# Patient Record
Sex: Male | Born: 1945 | Race: White | Hispanic: No | Marital: Married | Smoking: Never smoker
Health system: Southern US, Community
[De-identification: ages and names within clinical notes are randomized; demographics above are authoritative.]

## PROBLEM LIST (undated history)

## (undated) DIAGNOSIS — G459 Transient cerebral ischemic attack, unspecified: Secondary | ICD-10-CM

## (undated) DIAGNOSIS — F988 Other specified behavioral and emotional disorders with onset usually occurring in childhood and adolescence: Secondary | ICD-10-CM

## (undated) DIAGNOSIS — R112 Nausea with vomiting, unspecified: Secondary | ICD-10-CM

## (undated) DIAGNOSIS — E8881 Metabolic syndrome: Secondary | ICD-10-CM

## (undated) DIAGNOSIS — Z9889 Other specified postprocedural states: Secondary | ICD-10-CM

## (undated) DIAGNOSIS — I639 Cerebral infarction, unspecified: Secondary | ICD-10-CM

## (undated) DIAGNOSIS — R011 Cardiac murmur, unspecified: Secondary | ICD-10-CM

## (undated) DIAGNOSIS — B029 Zoster without complications: Secondary | ICD-10-CM

## (undated) DIAGNOSIS — G4733 Obstructive sleep apnea (adult) (pediatric): Secondary | ICD-10-CM

## (undated) DIAGNOSIS — J189 Pneumonia, unspecified organism: Secondary | ICD-10-CM

## (undated) DIAGNOSIS — K5790 Diverticulosis of intestine, part unspecified, without perforation or abscess without bleeding: Secondary | ICD-10-CM

## (undated) DIAGNOSIS — K219 Gastro-esophageal reflux disease without esophagitis: Secondary | ICD-10-CM

## (undated) DIAGNOSIS — E785 Hyperlipidemia, unspecified: Secondary | ICD-10-CM

## (undated) DIAGNOSIS — Z9989 Dependence on other enabling machines and devices: Secondary | ICD-10-CM

## (undated) DIAGNOSIS — Z87442 Personal history of urinary calculi: Secondary | ICD-10-CM

## (undated) DIAGNOSIS — R7301 Impaired fasting glucose: Secondary | ICD-10-CM

## (undated) DIAGNOSIS — M1990 Unspecified osteoarthritis, unspecified site: Secondary | ICD-10-CM

## (undated) DIAGNOSIS — I10 Essential (primary) hypertension: Secondary | ICD-10-CM

## (undated) HISTORY — DX: Diverticulosis of intestine, part unspecified, without perforation or abscess without bleeding: K57.90

## (undated) HISTORY — DX: Other specified behavioral and emotional disorders with onset usually occurring in childhood and adolescence: F98.8

## (undated) HISTORY — DX: Hyperlipidemia, unspecified: E78.5

## (undated) HISTORY — PX: TONSILLECTOMY AND ADENOIDECTOMY: SUR1326

## (undated) HISTORY — DX: Impaired fasting glucose: R73.01

## (undated) HISTORY — PX: CARPAL TUNNEL RELEASE: SHX101

## (undated) HISTORY — PX: CYSTOSCOPY W/ STONE MANIPULATION: SHX1427

## (undated) HISTORY — DX: Metabolic syndrome: E88.810

## (undated) HISTORY — DX: Metabolic syndrome: E88.81

## (undated) HISTORY — DX: Gastro-esophageal reflux disease without esophagitis: K21.9

## (undated) HISTORY — DX: Transient cerebral ischemic attack, unspecified: G45.9

## (undated) HISTORY — DX: Zoster without complications: B02.9

## (undated) HISTORY — PX: FINGER SURGERY: SHX640

---

## 2006-01-07 ENCOUNTER — Ambulatory Visit (HOSPITAL_COMMUNITY): Admission: RE | Admit: 2006-01-07 | Discharge: 2006-01-07 | Payer: Self-pay | Admitting: Internal Medicine

## 2008-04-05 ENCOUNTER — Ambulatory Visit: Payer: Self-pay | Admitting: Internal Medicine

## 2008-04-19 ENCOUNTER — Ambulatory Visit: Payer: Self-pay | Admitting: Internal Medicine

## 2008-04-19 ENCOUNTER — Encounter: Payer: Self-pay | Admitting: Internal Medicine

## 2008-06-30 ENCOUNTER — Encounter: Admission: RE | Admit: 2008-06-30 | Discharge: 2008-06-30 | Payer: Self-pay | Admitting: Internal Medicine

## 2010-04-01 ENCOUNTER — Emergency Department (HOSPITAL_COMMUNITY): Admission: EM | Admit: 2010-04-01 | Discharge: 2010-04-01 | Payer: Self-pay | Admitting: Family Medicine

## 2011-12-24 DIAGNOSIS — G459 Transient cerebral ischemic attack, unspecified: Secondary | ICD-10-CM

## 2011-12-24 HISTORY — DX: Transient cerebral ischemic attack, unspecified: G45.9

## 2012-06-23 ENCOUNTER — Other Ambulatory Visit (HOSPITAL_COMMUNITY): Payer: Self-pay | Admitting: Internal Medicine

## 2012-06-23 DIAGNOSIS — R51 Headache: Secondary | ICD-10-CM

## 2012-06-24 ENCOUNTER — Ambulatory Visit (HOSPITAL_COMMUNITY): Payer: Federal, State, Local not specified - PPO | Attending: Cardiology

## 2012-06-24 ENCOUNTER — Other Ambulatory Visit: Payer: Self-pay | Admitting: *Deleted

## 2012-06-24 DIAGNOSIS — H538 Other visual disturbances: Secondary | ICD-10-CM | POA: Insufficient documentation

## 2012-06-24 DIAGNOSIS — E785 Hyperlipidemia, unspecified: Secondary | ICD-10-CM | POA: Insufficient documentation

## 2012-06-24 DIAGNOSIS — I059 Rheumatic mitral valve disease, unspecified: Secondary | ICD-10-CM | POA: Insufficient documentation

## 2012-06-24 DIAGNOSIS — I359 Nonrheumatic aortic valve disorder, unspecified: Secondary | ICD-10-CM | POA: Insufficient documentation

## 2012-06-24 DIAGNOSIS — R51 Headache: Secondary | ICD-10-CM

## 2012-06-24 DIAGNOSIS — G43909 Migraine, unspecified, not intractable, without status migrainosus: Secondary | ICD-10-CM | POA: Insufficient documentation

## 2012-06-24 DIAGNOSIS — G459 Transient cerebral ischemic attack, unspecified: Secondary | ICD-10-CM | POA: Insufficient documentation

## 2012-06-24 NOTE — Progress Notes (Signed)
Echocardiogram performed.  

## 2012-06-26 ENCOUNTER — Encounter (INDEPENDENT_AMBULATORY_CARE_PROVIDER_SITE_OTHER): Payer: Federal, State, Local not specified - PPO

## 2012-06-26 DIAGNOSIS — H538 Other visual disturbances: Secondary | ICD-10-CM

## 2012-06-26 DIAGNOSIS — H53129 Transient visual loss, unspecified eye: Secondary | ICD-10-CM

## 2012-06-26 DIAGNOSIS — G459 Transient cerebral ischemic attack, unspecified: Secondary | ICD-10-CM

## 2012-06-29 ENCOUNTER — Encounter (HOSPITAL_COMMUNITY): Payer: Self-pay | Admitting: Internal Medicine

## 2015-05-24 ENCOUNTER — Encounter: Payer: Self-pay | Admitting: Internal Medicine

## 2016-02-27 DIAGNOSIS — M9904 Segmental and somatic dysfunction of sacral region: Secondary | ICD-10-CM | POA: Diagnosis not present

## 2016-02-27 DIAGNOSIS — M9905 Segmental and somatic dysfunction of pelvic region: Secondary | ICD-10-CM | POA: Diagnosis not present

## 2016-02-27 DIAGNOSIS — M5137 Other intervertebral disc degeneration, lumbosacral region: Secondary | ICD-10-CM | POA: Diagnosis not present

## 2016-02-27 DIAGNOSIS — Q72811 Congenital shortening of right lower limb: Secondary | ICD-10-CM | POA: Diagnosis not present

## 2016-02-27 DIAGNOSIS — M5106 Intervertebral disc disorders with myelopathy, lumbar region: Secondary | ICD-10-CM | POA: Diagnosis not present

## 2016-02-27 DIAGNOSIS — M9903 Segmental and somatic dysfunction of lumbar region: Secondary | ICD-10-CM | POA: Diagnosis not present

## 2016-03-01 DIAGNOSIS — M9903 Segmental and somatic dysfunction of lumbar region: Secondary | ICD-10-CM | POA: Diagnosis not present

## 2016-03-01 DIAGNOSIS — M5137 Other intervertebral disc degeneration, lumbosacral region: Secondary | ICD-10-CM | POA: Diagnosis not present

## 2016-03-01 DIAGNOSIS — M5106 Intervertebral disc disorders with myelopathy, lumbar region: Secondary | ICD-10-CM | POA: Diagnosis not present

## 2016-03-01 DIAGNOSIS — Q72811 Congenital shortening of right lower limb: Secondary | ICD-10-CM | POA: Diagnosis not present

## 2016-03-01 DIAGNOSIS — M9905 Segmental and somatic dysfunction of pelvic region: Secondary | ICD-10-CM | POA: Diagnosis not present

## 2016-03-01 DIAGNOSIS — M9904 Segmental and somatic dysfunction of sacral region: Secondary | ICD-10-CM | POA: Diagnosis not present

## 2016-03-04 DIAGNOSIS — M9903 Segmental and somatic dysfunction of lumbar region: Secondary | ICD-10-CM | POA: Diagnosis not present

## 2016-03-04 DIAGNOSIS — M9904 Segmental and somatic dysfunction of sacral region: Secondary | ICD-10-CM | POA: Diagnosis not present

## 2016-03-04 DIAGNOSIS — M9905 Segmental and somatic dysfunction of pelvic region: Secondary | ICD-10-CM | POA: Diagnosis not present

## 2016-03-04 DIAGNOSIS — M5137 Other intervertebral disc degeneration, lumbosacral region: Secondary | ICD-10-CM | POA: Diagnosis not present

## 2016-03-04 DIAGNOSIS — Q72811 Congenital shortening of right lower limb: Secondary | ICD-10-CM | POA: Diagnosis not present

## 2016-03-04 DIAGNOSIS — M5106 Intervertebral disc disorders with myelopathy, lumbar region: Secondary | ICD-10-CM | POA: Diagnosis not present

## 2016-03-05 DIAGNOSIS — M5137 Other intervertebral disc degeneration, lumbosacral region: Secondary | ICD-10-CM | POA: Diagnosis not present

## 2016-03-05 DIAGNOSIS — M5106 Intervertebral disc disorders with myelopathy, lumbar region: Secondary | ICD-10-CM | POA: Diagnosis not present

## 2016-03-05 DIAGNOSIS — M9904 Segmental and somatic dysfunction of sacral region: Secondary | ICD-10-CM | POA: Diagnosis not present

## 2016-03-05 DIAGNOSIS — Q72811 Congenital shortening of right lower limb: Secondary | ICD-10-CM | POA: Diagnosis not present

## 2016-03-05 DIAGNOSIS — M9905 Segmental and somatic dysfunction of pelvic region: Secondary | ICD-10-CM | POA: Diagnosis not present

## 2016-03-05 DIAGNOSIS — M9903 Segmental and somatic dysfunction of lumbar region: Secondary | ICD-10-CM | POA: Diagnosis not present

## 2016-03-07 DIAGNOSIS — M5106 Intervertebral disc disorders with myelopathy, lumbar region: Secondary | ICD-10-CM | POA: Diagnosis not present

## 2016-03-07 DIAGNOSIS — Q72811 Congenital shortening of right lower limb: Secondary | ICD-10-CM | POA: Diagnosis not present

## 2016-03-07 DIAGNOSIS — M9904 Segmental and somatic dysfunction of sacral region: Secondary | ICD-10-CM | POA: Diagnosis not present

## 2016-03-07 DIAGNOSIS — M9905 Segmental and somatic dysfunction of pelvic region: Secondary | ICD-10-CM | POA: Diagnosis not present

## 2016-03-07 DIAGNOSIS — M5137 Other intervertebral disc degeneration, lumbosacral region: Secondary | ICD-10-CM | POA: Diagnosis not present

## 2016-03-07 DIAGNOSIS — M9903 Segmental and somatic dysfunction of lumbar region: Secondary | ICD-10-CM | POA: Diagnosis not present

## 2016-03-18 DIAGNOSIS — M9905 Segmental and somatic dysfunction of pelvic region: Secondary | ICD-10-CM | POA: Diagnosis not present

## 2016-03-18 DIAGNOSIS — Q72811 Congenital shortening of right lower limb: Secondary | ICD-10-CM | POA: Diagnosis not present

## 2016-03-18 DIAGNOSIS — M5137 Other intervertebral disc degeneration, lumbosacral region: Secondary | ICD-10-CM | POA: Diagnosis not present

## 2016-03-18 DIAGNOSIS — M5106 Intervertebral disc disorders with myelopathy, lumbar region: Secondary | ICD-10-CM | POA: Diagnosis not present

## 2016-03-18 DIAGNOSIS — M9904 Segmental and somatic dysfunction of sacral region: Secondary | ICD-10-CM | POA: Diagnosis not present

## 2016-03-18 DIAGNOSIS — M9903 Segmental and somatic dysfunction of lumbar region: Secondary | ICD-10-CM | POA: Diagnosis not present

## 2016-03-19 DIAGNOSIS — M9904 Segmental and somatic dysfunction of sacral region: Secondary | ICD-10-CM | POA: Diagnosis not present

## 2016-03-19 DIAGNOSIS — M5106 Intervertebral disc disorders with myelopathy, lumbar region: Secondary | ICD-10-CM | POA: Diagnosis not present

## 2016-03-19 DIAGNOSIS — M9905 Segmental and somatic dysfunction of pelvic region: Secondary | ICD-10-CM | POA: Diagnosis not present

## 2016-03-19 DIAGNOSIS — M5137 Other intervertebral disc degeneration, lumbosacral region: Secondary | ICD-10-CM | POA: Diagnosis not present

## 2016-03-19 DIAGNOSIS — Q72811 Congenital shortening of right lower limb: Secondary | ICD-10-CM | POA: Diagnosis not present

## 2016-03-19 DIAGNOSIS — M9903 Segmental and somatic dysfunction of lumbar region: Secondary | ICD-10-CM | POA: Diagnosis not present

## 2016-03-21 DIAGNOSIS — M9905 Segmental and somatic dysfunction of pelvic region: Secondary | ICD-10-CM | POA: Diagnosis not present

## 2016-03-21 DIAGNOSIS — M5106 Intervertebral disc disorders with myelopathy, lumbar region: Secondary | ICD-10-CM | POA: Diagnosis not present

## 2016-03-21 DIAGNOSIS — M9903 Segmental and somatic dysfunction of lumbar region: Secondary | ICD-10-CM | POA: Diagnosis not present

## 2016-03-21 DIAGNOSIS — M9904 Segmental and somatic dysfunction of sacral region: Secondary | ICD-10-CM | POA: Diagnosis not present

## 2016-03-21 DIAGNOSIS — Q72811 Congenital shortening of right lower limb: Secondary | ICD-10-CM | POA: Diagnosis not present

## 2016-03-21 DIAGNOSIS — M5137 Other intervertebral disc degeneration, lumbosacral region: Secondary | ICD-10-CM | POA: Diagnosis not present

## 2016-03-26 DIAGNOSIS — Q72811 Congenital shortening of right lower limb: Secondary | ICD-10-CM | POA: Diagnosis not present

## 2016-03-26 DIAGNOSIS — M9903 Segmental and somatic dysfunction of lumbar region: Secondary | ICD-10-CM | POA: Diagnosis not present

## 2016-03-26 DIAGNOSIS — M5137 Other intervertebral disc degeneration, lumbosacral region: Secondary | ICD-10-CM | POA: Diagnosis not present

## 2016-03-26 DIAGNOSIS — M5106 Intervertebral disc disorders with myelopathy, lumbar region: Secondary | ICD-10-CM | POA: Diagnosis not present

## 2016-03-26 DIAGNOSIS — M9904 Segmental and somatic dysfunction of sacral region: Secondary | ICD-10-CM | POA: Diagnosis not present

## 2016-03-26 DIAGNOSIS — M9905 Segmental and somatic dysfunction of pelvic region: Secondary | ICD-10-CM | POA: Diagnosis not present

## 2016-03-28 DIAGNOSIS — Q72811 Congenital shortening of right lower limb: Secondary | ICD-10-CM | POA: Diagnosis not present

## 2016-03-28 DIAGNOSIS — M5137 Other intervertebral disc degeneration, lumbosacral region: Secondary | ICD-10-CM | POA: Diagnosis not present

## 2016-03-28 DIAGNOSIS — M5106 Intervertebral disc disorders with myelopathy, lumbar region: Secondary | ICD-10-CM | POA: Diagnosis not present

## 2016-03-28 DIAGNOSIS — M9903 Segmental and somatic dysfunction of lumbar region: Secondary | ICD-10-CM | POA: Diagnosis not present

## 2016-03-28 DIAGNOSIS — M9904 Segmental and somatic dysfunction of sacral region: Secondary | ICD-10-CM | POA: Diagnosis not present

## 2016-03-28 DIAGNOSIS — M9905 Segmental and somatic dysfunction of pelvic region: Secondary | ICD-10-CM | POA: Diagnosis not present

## 2016-04-02 DIAGNOSIS — Q72811 Congenital shortening of right lower limb: Secondary | ICD-10-CM | POA: Diagnosis not present

## 2016-04-02 DIAGNOSIS — M9905 Segmental and somatic dysfunction of pelvic region: Secondary | ICD-10-CM | POA: Diagnosis not present

## 2016-04-02 DIAGNOSIS — M9903 Segmental and somatic dysfunction of lumbar region: Secondary | ICD-10-CM | POA: Diagnosis not present

## 2016-04-02 DIAGNOSIS — M5106 Intervertebral disc disorders with myelopathy, lumbar region: Secondary | ICD-10-CM | POA: Diagnosis not present

## 2016-04-02 DIAGNOSIS — M5137 Other intervertebral disc degeneration, lumbosacral region: Secondary | ICD-10-CM | POA: Diagnosis not present

## 2016-04-02 DIAGNOSIS — M9904 Segmental and somatic dysfunction of sacral region: Secondary | ICD-10-CM | POA: Diagnosis not present

## 2016-04-04 DIAGNOSIS — M9904 Segmental and somatic dysfunction of sacral region: Secondary | ICD-10-CM | POA: Diagnosis not present

## 2016-04-04 DIAGNOSIS — M9903 Segmental and somatic dysfunction of lumbar region: Secondary | ICD-10-CM | POA: Diagnosis not present

## 2016-04-04 DIAGNOSIS — M5106 Intervertebral disc disorders with myelopathy, lumbar region: Secondary | ICD-10-CM | POA: Diagnosis not present

## 2016-04-04 DIAGNOSIS — M9905 Segmental and somatic dysfunction of pelvic region: Secondary | ICD-10-CM | POA: Diagnosis not present

## 2016-04-04 DIAGNOSIS — M5137 Other intervertebral disc degeneration, lumbosacral region: Secondary | ICD-10-CM | POA: Diagnosis not present

## 2016-04-04 DIAGNOSIS — Q72811 Congenital shortening of right lower limb: Secondary | ICD-10-CM | POA: Diagnosis not present

## 2016-04-11 DIAGNOSIS — Q72811 Congenital shortening of right lower limb: Secondary | ICD-10-CM | POA: Diagnosis not present

## 2016-04-11 DIAGNOSIS — I10 Essential (primary) hypertension: Secondary | ICD-10-CM | POA: Diagnosis not present

## 2016-04-11 DIAGNOSIS — M9904 Segmental and somatic dysfunction of sacral region: Secondary | ICD-10-CM | POA: Diagnosis not present

## 2016-04-11 DIAGNOSIS — R7301 Impaired fasting glucose: Secondary | ICD-10-CM | POA: Diagnosis not present

## 2016-04-11 DIAGNOSIS — Z125 Encounter for screening for malignant neoplasm of prostate: Secondary | ICD-10-CM | POA: Diagnosis not present

## 2016-04-11 DIAGNOSIS — M5137 Other intervertebral disc degeneration, lumbosacral region: Secondary | ICD-10-CM | POA: Diagnosis not present

## 2016-04-11 DIAGNOSIS — M9903 Segmental and somatic dysfunction of lumbar region: Secondary | ICD-10-CM | POA: Diagnosis not present

## 2016-04-11 DIAGNOSIS — M5106 Intervertebral disc disorders with myelopathy, lumbar region: Secondary | ICD-10-CM | POA: Diagnosis not present

## 2016-04-11 DIAGNOSIS — M9905 Segmental and somatic dysfunction of pelvic region: Secondary | ICD-10-CM | POA: Diagnosis not present

## 2016-04-11 DIAGNOSIS — E784 Other hyperlipidemia: Secondary | ICD-10-CM | POA: Diagnosis not present

## 2016-04-16 DIAGNOSIS — M9903 Segmental and somatic dysfunction of lumbar region: Secondary | ICD-10-CM | POA: Diagnosis not present

## 2016-04-16 DIAGNOSIS — M9904 Segmental and somatic dysfunction of sacral region: Secondary | ICD-10-CM | POA: Diagnosis not present

## 2016-04-16 DIAGNOSIS — M5106 Intervertebral disc disorders with myelopathy, lumbar region: Secondary | ICD-10-CM | POA: Diagnosis not present

## 2016-04-16 DIAGNOSIS — Q72811 Congenital shortening of right lower limb: Secondary | ICD-10-CM | POA: Diagnosis not present

## 2016-04-16 DIAGNOSIS — M9905 Segmental and somatic dysfunction of pelvic region: Secondary | ICD-10-CM | POA: Diagnosis not present

## 2016-04-16 DIAGNOSIS — M5137 Other intervertebral disc degeneration, lumbosacral region: Secondary | ICD-10-CM | POA: Diagnosis not present

## 2016-04-17 ENCOUNTER — Other Ambulatory Visit: Payer: Self-pay | Admitting: Internal Medicine

## 2016-04-17 DIAGNOSIS — Z125 Encounter for screening for malignant neoplasm of prostate: Secondary | ICD-10-CM | POA: Diagnosis not present

## 2016-04-17 DIAGNOSIS — Z8679 Personal history of other diseases of the circulatory system: Secondary | ICD-10-CM | POA: Diagnosis not present

## 2016-04-17 DIAGNOSIS — N5089 Other specified disorders of the male genital organs: Secondary | ICD-10-CM

## 2016-04-17 DIAGNOSIS — Z0000 Encounter for general adult medical examination without abnormal findings: Secondary | ICD-10-CM | POA: Diagnosis not present

## 2016-04-17 DIAGNOSIS — E784 Other hyperlipidemia: Secondary | ICD-10-CM | POA: Diagnosis not present

## 2016-04-17 DIAGNOSIS — I10 Essential (primary) hypertension: Secondary | ICD-10-CM | POA: Diagnosis not present

## 2016-04-17 DIAGNOSIS — M5416 Radiculopathy, lumbar region: Secondary | ICD-10-CM | POA: Diagnosis not present

## 2016-04-17 DIAGNOSIS — E8881 Metabolic syndrome: Secondary | ICD-10-CM | POA: Diagnosis not present

## 2016-04-17 DIAGNOSIS — R7301 Impaired fasting glucose: Secondary | ICD-10-CM | POA: Diagnosis not present

## 2016-04-17 DIAGNOSIS — G4733 Obstructive sleep apnea (adult) (pediatric): Secondary | ICD-10-CM | POA: Diagnosis not present

## 2016-04-18 ENCOUNTER — Ambulatory Visit
Admission: RE | Admit: 2016-04-18 | Discharge: 2016-04-18 | Disposition: A | Discharge status: Home / Self Care | Payer: Medicare HMO | Source: Ambulatory Visit | Attending: Internal Medicine | Admitting: Internal Medicine

## 2016-04-18 DIAGNOSIS — N5089 Other specified disorders of the male genital organs: Secondary | ICD-10-CM

## 2016-04-18 DIAGNOSIS — N433 Hydrocele, unspecified: Secondary | ICD-10-CM | POA: Diagnosis not present

## 2016-04-18 DIAGNOSIS — N503 Cyst of epididymis: Secondary | ICD-10-CM | POA: Diagnosis not present

## 2016-04-24 DIAGNOSIS — M9904 Segmental and somatic dysfunction of sacral region: Secondary | ICD-10-CM | POA: Diagnosis not present

## 2016-04-24 DIAGNOSIS — M5106 Intervertebral disc disorders with myelopathy, lumbar region: Secondary | ICD-10-CM | POA: Diagnosis not present

## 2016-04-24 DIAGNOSIS — M5137 Other intervertebral disc degeneration, lumbosacral region: Secondary | ICD-10-CM | POA: Diagnosis not present

## 2016-04-24 DIAGNOSIS — M9903 Segmental and somatic dysfunction of lumbar region: Secondary | ICD-10-CM | POA: Diagnosis not present

## 2016-04-24 DIAGNOSIS — Q72811 Congenital shortening of right lower limb: Secondary | ICD-10-CM | POA: Diagnosis not present

## 2016-04-24 DIAGNOSIS — M9905 Segmental and somatic dysfunction of pelvic region: Secondary | ICD-10-CM | POA: Diagnosis not present

## 2016-05-23 DIAGNOSIS — M9905 Segmental and somatic dysfunction of pelvic region: Secondary | ICD-10-CM | POA: Diagnosis not present

## 2016-05-23 DIAGNOSIS — M5106 Intervertebral disc disorders with myelopathy, lumbar region: Secondary | ICD-10-CM | POA: Diagnosis not present

## 2016-05-23 DIAGNOSIS — Q72811 Congenital shortening of right lower limb: Secondary | ICD-10-CM | POA: Diagnosis not present

## 2016-05-23 DIAGNOSIS — M5137 Other intervertebral disc degeneration, lumbosacral region: Secondary | ICD-10-CM | POA: Diagnosis not present

## 2016-05-23 DIAGNOSIS — M9904 Segmental and somatic dysfunction of sacral region: Secondary | ICD-10-CM | POA: Diagnosis not present

## 2016-05-23 DIAGNOSIS — M9903 Segmental and somatic dysfunction of lumbar region: Secondary | ICD-10-CM | POA: Diagnosis not present

## 2016-07-31 DIAGNOSIS — H2513 Age-related nuclear cataract, bilateral: Secondary | ICD-10-CM | POA: Diagnosis not present

## 2016-07-31 DIAGNOSIS — H5213 Myopia, bilateral: Secondary | ICD-10-CM | POA: Diagnosis not present

## 2016-10-05 DIAGNOSIS — Z23 Encounter for immunization: Secondary | ICD-10-CM | POA: Diagnosis not present

## 2016-11-25 DIAGNOSIS — Z6829 Body mass index (BMI) 29.0-29.9, adult: Secondary | ICD-10-CM | POA: Diagnosis not present

## 2016-11-25 DIAGNOSIS — K219 Gastro-esophageal reflux disease without esophagitis: Secondary | ICD-10-CM | POA: Diagnosis not present

## 2016-11-25 DIAGNOSIS — I10 Essential (primary) hypertension: Secondary | ICD-10-CM | POA: Diagnosis not present

## 2016-11-25 DIAGNOSIS — Z0000 Encounter for general adult medical examination without abnormal findings: Secondary | ICD-10-CM | POA: Diagnosis not present

## 2016-11-25 DIAGNOSIS — E785 Hyperlipidemia, unspecified: Secondary | ICD-10-CM | POA: Diagnosis not present

## 2017-01-17 DIAGNOSIS — H52203 Unspecified astigmatism, bilateral: Secondary | ICD-10-CM | POA: Diagnosis not present

## 2017-01-17 DIAGNOSIS — H5213 Myopia, bilateral: Secondary | ICD-10-CM | POA: Diagnosis not present

## 2017-01-17 DIAGNOSIS — H2513 Age-related nuclear cataract, bilateral: Secondary | ICD-10-CM | POA: Diagnosis not present

## 2017-04-15 DIAGNOSIS — Z125 Encounter for screening for malignant neoplasm of prostate: Secondary | ICD-10-CM | POA: Diagnosis not present

## 2017-04-15 DIAGNOSIS — I10 Essential (primary) hypertension: Secondary | ICD-10-CM | POA: Diagnosis not present

## 2017-04-15 DIAGNOSIS — R7301 Impaired fasting glucose: Secondary | ICD-10-CM | POA: Diagnosis not present

## 2017-04-15 DIAGNOSIS — E784 Other hyperlipidemia: Secondary | ICD-10-CM | POA: Diagnosis not present

## 2017-04-22 DIAGNOSIS — R7301 Impaired fasting glucose: Secondary | ICD-10-CM | POA: Diagnosis not present

## 2017-04-22 DIAGNOSIS — E784 Other hyperlipidemia: Secondary | ICD-10-CM | POA: Diagnosis not present

## 2017-04-22 DIAGNOSIS — N503 Cyst of epididymis: Secondary | ICD-10-CM | POA: Diagnosis not present

## 2017-04-22 DIAGNOSIS — Z8679 Personal history of other diseases of the circulatory system: Secondary | ICD-10-CM | POA: Diagnosis not present

## 2017-04-22 DIAGNOSIS — K219 Gastro-esophageal reflux disease without esophagitis: Secondary | ICD-10-CM | POA: Diagnosis not present

## 2017-04-22 DIAGNOSIS — E8881 Metabolic syndrome: Secondary | ICD-10-CM | POA: Diagnosis not present

## 2017-04-22 DIAGNOSIS — E781 Pure hyperglyceridemia: Secondary | ICD-10-CM | POA: Diagnosis not present

## 2017-04-22 DIAGNOSIS — I10 Essential (primary) hypertension: Secondary | ICD-10-CM | POA: Diagnosis not present

## 2017-04-22 DIAGNOSIS — M5416 Radiculopathy, lumbar region: Secondary | ICD-10-CM | POA: Diagnosis not present

## 2017-04-22 DIAGNOSIS — Z0000 Encounter for general adult medical examination without abnormal findings: Secondary | ICD-10-CM | POA: Diagnosis not present

## 2017-04-25 DIAGNOSIS — Z1212 Encounter for screening for malignant neoplasm of rectum: Secondary | ICD-10-CM | POA: Diagnosis not present

## 2017-06-20 DIAGNOSIS — H9313 Tinnitus, bilateral: Secondary | ICD-10-CM | POA: Diagnosis not present

## 2017-06-20 DIAGNOSIS — J302 Other seasonal allergic rhinitis: Secondary | ICD-10-CM | POA: Diagnosis not present

## 2017-06-20 DIAGNOSIS — R69 Illness, unspecified: Secondary | ICD-10-CM | POA: Diagnosis not present

## 2017-06-20 DIAGNOSIS — M130 Polyarthritis, unspecified: Secondary | ICD-10-CM | POA: Diagnosis not present

## 2017-06-20 DIAGNOSIS — M25549 Pain in joints of unspecified hand: Secondary | ICD-10-CM | POA: Diagnosis not present

## 2017-06-20 DIAGNOSIS — Z973 Presence of spectacles and contact lenses: Secondary | ICD-10-CM | POA: Diagnosis not present

## 2017-06-20 DIAGNOSIS — K219 Gastro-esophageal reflux disease without esophagitis: Secondary | ICD-10-CM | POA: Diagnosis not present

## 2017-06-20 DIAGNOSIS — I10 Essential (primary) hypertension: Secondary | ICD-10-CM | POA: Diagnosis not present

## 2017-06-20 DIAGNOSIS — Z0000 Encounter for general adult medical examination without abnormal findings: Secondary | ICD-10-CM | POA: Diagnosis not present

## 2017-06-20 DIAGNOSIS — E669 Obesity, unspecified: Secondary | ICD-10-CM | POA: Diagnosis not present

## 2017-06-20 DIAGNOSIS — E785 Hyperlipidemia, unspecified: Secondary | ICD-10-CM | POA: Diagnosis not present

## 2017-08-05 DIAGNOSIS — L820 Inflamed seborrheic keratosis: Secondary | ICD-10-CM | POA: Diagnosis not present

## 2017-08-05 DIAGNOSIS — X32XXXD Exposure to sunlight, subsequent encounter: Secondary | ICD-10-CM | POA: Diagnosis not present

## 2017-08-05 DIAGNOSIS — L570 Actinic keratosis: Secondary | ICD-10-CM | POA: Diagnosis not present

## 2017-08-05 DIAGNOSIS — D225 Melanocytic nevi of trunk: Secondary | ICD-10-CM | POA: Diagnosis not present

## 2017-08-05 DIAGNOSIS — D2272 Melanocytic nevi of left lower limb, including hip: Secondary | ICD-10-CM | POA: Diagnosis not present

## 2017-08-05 DIAGNOSIS — D485 Neoplasm of uncertain behavior of skin: Secondary | ICD-10-CM | POA: Diagnosis not present

## 2017-08-05 DIAGNOSIS — G4733 Obstructive sleep apnea (adult) (pediatric): Secondary | ICD-10-CM | POA: Diagnosis not present

## 2017-08-05 DIAGNOSIS — Z1283 Encounter for screening for malignant neoplasm of skin: Secondary | ICD-10-CM | POA: Diagnosis not present

## 2017-08-22 DIAGNOSIS — D485 Neoplasm of uncertain behavior of skin: Secondary | ICD-10-CM | POA: Diagnosis not present

## 2017-08-22 DIAGNOSIS — L988 Other specified disorders of the skin and subcutaneous tissue: Secondary | ICD-10-CM | POA: Diagnosis not present

## 2017-10-04 DIAGNOSIS — Z23 Encounter for immunization: Secondary | ICD-10-CM | POA: Diagnosis not present

## 2017-12-10 DIAGNOSIS — Z0100 Encounter for examination of eyes and vision without abnormal findings: Secondary | ICD-10-CM | POA: Diagnosis not present

## 2018-01-15 ENCOUNTER — Encounter: Payer: Self-pay | Admitting: Internal Medicine

## 2018-01-15 DIAGNOSIS — G250 Essential tremor: Secondary | ICD-10-CM | POA: Diagnosis not present

## 2018-01-15 DIAGNOSIS — R1310 Dysphagia, unspecified: Secondary | ICD-10-CM | POA: Diagnosis not present

## 2018-01-15 DIAGNOSIS — H538 Other visual disturbances: Secondary | ICD-10-CM | POA: Diagnosis not present

## 2018-01-15 DIAGNOSIS — Z6830 Body mass index (BMI) 30.0-30.9, adult: Secondary | ICD-10-CM | POA: Diagnosis not present

## 2018-03-09 ENCOUNTER — Encounter (INDEPENDENT_AMBULATORY_CARE_PROVIDER_SITE_OTHER): Payer: Self-pay

## 2018-03-09 ENCOUNTER — Encounter: Payer: Self-pay | Admitting: Internal Medicine

## 2018-03-09 ENCOUNTER — Ambulatory Visit: Payer: Medicare HMO | Admitting: Internal Medicine

## 2018-03-09 VITALS — BP 136/90 | HR 80 | Ht 70.0 in | Wt 213.1 lb

## 2018-03-09 DIAGNOSIS — R1310 Dysphagia, unspecified: Secondary | ICD-10-CM | POA: Diagnosis not present

## 2018-03-09 DIAGNOSIS — Z1211 Encounter for screening for malignant neoplasm of colon: Secondary | ICD-10-CM | POA: Diagnosis not present

## 2018-03-09 DIAGNOSIS — K219 Gastro-esophageal reflux disease without esophagitis: Secondary | ICD-10-CM

## 2018-03-09 MED ORDER — NA SULFATE-K SULFATE-MG SULF 17.5-3.13-1.6 GM/177ML PO SOLN: 1.0000 | Freq: Once | ORAL | 0 refills | Status: AC

## 2018-03-09 NOTE — Patient Instructions (Signed)

## 2018-03-09 NOTE — Progress Notes (Signed)
HISTORY OF PRESENT ILLNESS:  Chris [name] is a 72 y.o. male, retired Immunologist, with past medical history as listed below who is referred by his primary care provider Dr. Brigitte Pulse regarding GERD, dysphagia, and follow-up colonoscopy. The patient was last seen April 2009 for routine screening colonoscopy. He was found to have left-sided diverticulosis and diminutive rectosigmoid colon polyps which were hyperplastic. Follow-up in 10 years recommended. The patient denies any interval family history of colon cancer or lower GI complaints. Next, he reports long-standing problems with indigestion and heartburn. Several PPIs have upset his stomach. He also reports intermit solid food dysphagia item such as rice and meat. More recently placed on pantoprazole 40 mg daily. Seems to be tolerating this. Seems to help. Still with some dysphagia. Outside office note and laboratories have been reviewed. Laboratories from January 2019 revealed unremarkable comprehensive metabolic panel and CBC with hemoglobin 14.4. Patient has never smoked and does not use alcohol.  REVIEW OF SYSTEMS:  All non-GI ROS negative unless otherwise stated in the history of present illness except for arthritis, back pain, hearing problems  Past Medical History:  Diagnosis Date  . ADD (attention deficit disorder)   . Diverticulosis   . GERD (gastroesophageal reflux disease)   . Hyperlipidemia   . IFG (impaired fasting glucose)   . Metabolic syndrome   . OSA (obstructive sleep apnea)   . Shingles   . TIA (transient ischemic attack)     Past Surgical History:  Procedure Laterality Date  . HAND SURGERY Left    thumb surgery  . hearing aids Bilateral   . TONSILECTOMY, ADENOIDECTOMY, BILATERAL MYRINGOTOMY AND TUBES      Social History Chris [name]  reports that  has never smoked. he has never used smokeless tobacco. He reports that he does not drink alcohol. His drug history is not on file.  family history includes Prostate  cancer in his father.  Not on File     PHYSICAL EXAMINATION: Vital signs: BP 136/90   Pulse 80   Ht 5\' 10"  (1.778 m)   Wt 213 lb 2 oz (96.7 kg)   BMI 30.58 kg/m   Constitutional: generally well-appearing, no acute distress Psychiatric: alert and oriented x3, cooperative Eyes: extraocular movements intact, anicteric, conjunctiva pink Mouth: oral pharynx moist, no lesions Neck: supple no lymphadenopathy Cardiovascular: heart regular rate and rhythm, no murmur Lungs: clear to auscultation bilaterally Abdomen: soft, nontender, nondistended, no obvious ascites, no peritoneal signs, normal bowel sounds, no organomegaly Rectal:deferred until colonoscopy Extremities: no clubbing cyanosis or lower extremity edema bilaterally Skin: no lesions on visible extremities Neuro: No focal deficits. Cranial nerves intact  ASSESSMENT:  #1. Chronic GERD. Doing better on pantoprazole 40 mg daily #2. Intermittent solid food dysphagia. Rule out peptic stricture #3. Index colonoscopy 2009 with diverticulosis and hyperplastic colon polyps. Due for follow-up screening. Appropriate candidate without contraindication   PLAN:  #1. Reflux precautions #2. Continue pantoprazole 40 mg daily #3. Schedule upper endoscopy with probable esophageal dilation.The nature of the procedure, as well as the risks, benefits, and alternatives were carefully and thoroughly reviewed with the patient. Ample time for discussion and questions allowed. The patient understood, was satisfied, and agreed to proceed. #4. Schedule screening colonoscopy.The nature of the procedure, as well as the risks, benefits, and alternatives were carefully and thoroughly reviewed with the patient. Ample time for discussion and questions allowed. The patient understood, was satisfied, and agreed to proceed.  A copy of this consultation note has been sent to  Dr Shaw

## 2018-03-17 DIAGNOSIS — H43811 Vitreous degeneration, right eye: Secondary | ICD-10-CM | POA: Diagnosis not present

## 2018-03-17 DIAGNOSIS — H2513 Age-related nuclear cataract, bilateral: Secondary | ICD-10-CM | POA: Diagnosis not present

## 2018-03-17 DIAGNOSIS — H5213 Myopia, bilateral: Secondary | ICD-10-CM | POA: Diagnosis not present

## 2018-03-25 ENCOUNTER — Other Ambulatory Visit: Payer: Self-pay

## 2018-03-25 ENCOUNTER — Inpatient Hospital Stay (HOSPITAL_COMMUNITY)
Admission: EM | Admit: 2018-03-25 | Discharge: 2018-03-27 | DRG: 066 | Disposition: A | Discharge status: Home / Self Care | Payer: Medicare HMO | Attending: Family Medicine | Admitting: Family Medicine

## 2018-03-25 ENCOUNTER — Emergency Department (HOSPITAL_COMMUNITY): Payer: Medicare HMO

## 2018-03-25 ENCOUNTER — Encounter (HOSPITAL_COMMUNITY): Payer: Self-pay

## 2018-03-25 DIAGNOSIS — Z9989 Dependence on other enabling machines and devices: Secondary | ICD-10-CM | POA: Diagnosis not present

## 2018-03-25 DIAGNOSIS — I639 Cerebral infarction, unspecified: Principal | ICD-10-CM

## 2018-03-25 DIAGNOSIS — F988 Other specified behavioral and emotional disorders with onset usually occurring in childhood and adolescence: Secondary | ICD-10-CM | POA: Diagnosis present

## 2018-03-25 DIAGNOSIS — E7439 Other disorders of intestinal carbohydrate absorption: Secondary | ICD-10-CM | POA: Diagnosis present

## 2018-03-25 DIAGNOSIS — K5790 Diverticulosis of intestine, part unspecified, without perforation or abscess without bleeding: Secondary | ICD-10-CM | POA: Diagnosis present

## 2018-03-25 DIAGNOSIS — R112 Nausea with vomiting, unspecified: Secondary | ICD-10-CM | POA: Diagnosis not present

## 2018-03-25 DIAGNOSIS — E669 Obesity, unspecified: Secondary | ICD-10-CM | POA: Diagnosis not present

## 2018-03-25 DIAGNOSIS — Z8673 Personal history of transient ischemic attack (TIA), and cerebral infarction without residual deficits: Secondary | ICD-10-CM

## 2018-03-25 DIAGNOSIS — E860 Dehydration: Secondary | ICD-10-CM | POA: Diagnosis present

## 2018-03-25 DIAGNOSIS — K219 Gastro-esophageal reflux disease without esophagitis: Secondary | ICD-10-CM | POA: Diagnosis present

## 2018-03-25 DIAGNOSIS — H81399 Other peripheral vertigo, unspecified ear: Secondary | ICD-10-CM | POA: Diagnosis present

## 2018-03-25 DIAGNOSIS — Z8042 Family history of malignant neoplasm of prostate: Secondary | ICD-10-CM

## 2018-03-25 DIAGNOSIS — Z974 Presence of external hearing-aid: Secondary | ICD-10-CM

## 2018-03-25 DIAGNOSIS — G4733 Obstructive sleep apnea (adult) (pediatric): Secondary | ICD-10-CM | POA: Diagnosis present

## 2018-03-25 DIAGNOSIS — Z6830 Body mass index (BMI) 30.0-30.9, adult: Secondary | ICD-10-CM

## 2018-03-25 DIAGNOSIS — Z7951 Long term (current) use of inhaled steroids: Secondary | ICD-10-CM

## 2018-03-25 DIAGNOSIS — E8881 Metabolic syndrome: Secondary | ICD-10-CM | POA: Diagnosis present

## 2018-03-25 DIAGNOSIS — R739 Hyperglycemia, unspecified: Secondary | ICD-10-CM | POA: Diagnosis present

## 2018-03-25 DIAGNOSIS — R0981 Nasal congestion: Secondary | ICD-10-CM | POA: Diagnosis not present

## 2018-03-25 DIAGNOSIS — Z7982 Long term (current) use of aspirin: Secondary | ICD-10-CM

## 2018-03-25 DIAGNOSIS — E785 Hyperlipidemia, unspecified: Secondary | ICD-10-CM | POA: Diagnosis not present

## 2018-03-25 DIAGNOSIS — D72829 Elevated white blood cell count, unspecified: Secondary | ICD-10-CM | POA: Diagnosis present

## 2018-03-25 DIAGNOSIS — I10 Essential (primary) hypertension: Secondary | ICD-10-CM | POA: Diagnosis present

## 2018-03-25 DIAGNOSIS — I159 Secondary hypertension, unspecified: Secondary | ICD-10-CM | POA: Diagnosis not present

## 2018-03-25 DIAGNOSIS — R42 Dizziness and giddiness: Secondary | ICD-10-CM | POA: Diagnosis not present

## 2018-03-25 DIAGNOSIS — H9319 Tinnitus, unspecified ear: Secondary | ICD-10-CM | POA: Diagnosis present

## 2018-03-25 DIAGNOSIS — I351 Nonrheumatic aortic (valve) insufficiency: Secondary | ICD-10-CM | POA: Diagnosis not present

## 2018-03-25 HISTORY — DX: Essential (primary) hypertension: I10

## 2018-03-25 HISTORY — DX: Obstructive sleep apnea (adult) (pediatric): Z99.89

## 2018-03-25 HISTORY — DX: Personal history of urinary calculi: Z87.442

## 2018-03-25 HISTORY — DX: Cardiac murmur, unspecified: R01.1

## 2018-03-25 HISTORY — DX: Unspecified osteoarthritis, unspecified site: M19.90

## 2018-03-25 HISTORY — DX: Cerebral infarction, unspecified: I63.9

## 2018-03-25 HISTORY — DX: Other specified postprocedural states: Z98.890

## 2018-03-25 HISTORY — DX: Pneumonia, unspecified organism: J18.9

## 2018-03-25 HISTORY — DX: Obstructive sleep apnea (adult) (pediatric): G47.33

## 2018-03-25 HISTORY — DX: Other specified postprocedural states: R11.2

## 2018-03-25 LAB — COMPREHENSIVE METABOLIC PANEL
ALK PHOS: 92 U/L (ref 38–126)
ALT: 35 U/L (ref 17–63)
AST: 25 U/L (ref 15–41)
Albumin: 4.5 g/dL (ref 3.5–5.0)
Anion gap: 13 (ref 5–15)
BILIRUBIN TOTAL: 1.3 mg/dL — AB (ref 0.3–1.2)
BUN: 10 mg/dL (ref 6–20)
CALCIUM: 9.2 mg/dL (ref 8.9–10.3)
CO2: 22 mmol/L (ref 22–32)
CREATININE: 1.14 mg/dL (ref 0.61–1.24)
Chloride: 102 mmol/L (ref 101–111)
GFR calc Af Amer: >60 mL/min (ref >60)
GFR calc non Af Amer: >60 mL/min (ref >60)
Glucose, Bld: 171 mg/dL — ABNORMAL HIGH (ref 65–99)
Potassium: 3.7 mmol/L (ref 3.5–5.1)
Sodium: 137 mmol/L (ref 135–145)
TOTAL PROTEIN: 7.4 g/dL (ref 6.5–8.1)

## 2018-03-25 LAB — PROTIME-INR
INR: 1.1
Prothrombin Time: 14.1 seconds (ref 11.4–15.2)

## 2018-03-25 LAB — DIFFERENTIAL
BASOS ABS: 0 10*3/uL (ref 0.0–0.1)
Basophils Relative: 0 %
EOS ABS: 0 10*3/uL (ref 0.0–0.7)
Eosinophils Relative: 0 %
LYMPHS ABS: 1.2 10*3/uL (ref 0.7–4.0)
LYMPHS PCT: 7 %
MONOS PCT: 4 %
Monocytes Absolute: 0.6 10*3/uL (ref 0.1–1.0)
NEUTROS PCT: 89 %
Neutro Abs: 14.4 10*3/uL — ABNORMAL HIGH (ref 1.7–7.7)

## 2018-03-25 LAB — CBC
HEMATOCRIT: 44.5 % (ref 39.0–52.0)
HEMOGLOBIN: 15.3 g/dL (ref 13.0–17.0)
MCH: 30.6 pg (ref 26.0–34.0)
MCHC: 34.4 g/dL (ref 30.0–36.0)
MCV: 89 fL (ref 78.0–100.0)
Platelets: 261 10*3/uL (ref 150–400)
RBC: 5 MIL/uL (ref 4.22–5.81)
RDW: 13.8 % (ref 11.5–15.5)
WBC: 16.2 10*3/uL — ABNORMAL HIGH (ref 4.0–10.5)

## 2018-03-25 LAB — I-STAT CHEM 8, ED
BUN: 11 mg/dL (ref 6–20)
CALCIUM ION: 1.07 mmol/L — AB (ref 1.15–1.40)
CHLORIDE: 103 mmol/L (ref 101–111)
CREATININE: 1 mg/dL (ref 0.61–1.24)
Glucose, Bld: 171 mg/dL — ABNORMAL HIGH (ref 65–99)
HCT: 46 % (ref 39.0–52.0)
Hemoglobin: 15.6 g/dL (ref 13.0–17.0)
Potassium: 3.9 mmol/L (ref 3.5–5.1)
SODIUM: 139 mmol/L (ref 135–145)
TCO2: 22 mmol/L (ref 22–32)

## 2018-03-25 LAB — I-STAT TROPONIN, ED: Troponin i, poc: 0 ng/mL (ref 0.00–0.08)

## 2018-03-25 LAB — APTT: aPTT: 28 seconds (ref 24–36)

## 2018-03-25 LAB — CBG MONITORING, ED: Glucose-Capillary: 168 mg/dL — ABNORMAL HIGH (ref 65–99)

## 2018-03-25 MED ORDER — ONDANSETRON HCL 4 MG/2ML IJ SOLN
4.0000 mg | Freq: Once | INTRAMUSCULAR | Status: AC
  Administered 2018-03-26: 4 mg via INTRAVENOUS
  Filled 2018-03-25: qty 2

## 2018-03-25 MED ORDER — SODIUM CHLORIDE 0.9 % IV BOLUS
1000.0000 mL | Freq: Once | INTRAVENOUS | Status: AC
  Administered 2018-03-26: 1000 mL via INTRAVENOUS

## 2018-03-25 MED ORDER — ONDANSETRON 4 MG PO TBDP
ORAL_TABLET | ORAL | Status: AC
  Administered 2018-03-25: 4 mg
  Filled 2018-03-25: qty 1

## 2018-03-25 MED ORDER — MECLIZINE HCL 25 MG PO TABS
25.0000 mg | ORAL_TABLET | Freq: Once | ORAL | Status: AC
  Administered 2018-03-26: 25 mg via ORAL
  Filled 2018-03-25: qty 1

## 2018-03-25 NOTE — ED Provider Notes (Signed)
[facility] DEPART[facility] Provider Note   CSN: [id number] Arrival date & time: 03/25/18  2007     History   Chief Complaint Chief Complaint  Patient presents with  . Dizziness  . Emesis    HPI Chris [name] is a 72 y.o. male.  HPI  Presents with concern for dizziness Began when waking up this AM Woke up at 8AM and had severe vertigo When eyes shut feels better. Feels like a spinning. Worse with movements, standing up, turning head.  Better when laying still. No numbness, weakness on one side, no change in vision, no change in speech Has been unable to stand or walk due to dizziness No history of similar Feels some head pressure Nausea and vomiting, likely 15-20 times Physician prescribed zofran (tablets not odt), and meczline but couldn't keep them down.   Nausea now when eyes hsut is tolerable. Threw up ODT zofran before it melted in the CT scanner after triage  Has some nasal congestion, hx of spring allergies No cough or fever No ear pain Hx of tinnitus for years 64yr ago had hearing aids   Past Medical History:  Diagnosis Date  . ADD (attention deficit disorder)   . Diverticulosis   . GERD (gastroesophageal reflux disease)   . Hyperlipidemia   . Hypertension   . IFG (impaired fasting glucose)   . Metabolic syndrome   . OSA (obstructive sleep apnea)   . Shingles   . TIA (transient ischemic attack)     Patient Active Problem List   Diagnosis Date Noted  . Acute CVA (cerebrovascular accident) ([facility]) 03/26/2018  . Obesity (BMI 30.0-34.9) 03/26/2018  . Hyperlipidemia 03/26/2018  . OSA on CPAP 03/26/2018  . Hypertension 03/26/2018  . GERD (gastroesophageal reflux disease) 03/26/2018    Past Surgical History:  Procedure Laterality Date  . HAND SURGERY Left    thumb surgery  . hearing aids Bilateral   . TONSILECTOMY, ADENOIDECTOMY, BILATERAL MYRINGOTOMY AND TUBES          Home Medications    Prior to Admission medications    Medication Sig Start Date End Date Taking? Authorizing Provider  aspirin EC 81 MG tablet Take 81 mg by mouth 3 (three) times a week.    Yes [provider]  atorvastatin (LIPITOR) 40 MG tablet Take 40 mg by mouth daily.   Yes [provider]  fluticasone (FLONASE) 50 MCG/ACT nasal spray Place 1 spray into both nostrils daily as needed for allergies.    Yes [provider]  meclizine (ANTIVERT) 25 MG tablet Take 25 mg by mouth 3 (three) times daily as needed for dizziness or nausea.  03/25/18  Yes [provider]  ondansetron (ZOFRAN) 4 MG tablet Take 4 mg by mouth 3 (three) times daily as needed for nausea.  03/25/18  Yes [provider]  pantoprazole (PROTONIX) 40 MG tablet Take 40 mg by mouth daily.   Yes [provider]  telmisartan (MICARDIS) 80 MG tablet Take 80 mg by mouth daily.   Yes [provider]    Family History Family History  Problem Relation Age of Onset  . Prostate cancer Father     Social History Social History   Tobacco Use  . Smoking status: Never Smoker  . Smokeless tobacco: Never Used  Substance Use Topics  . Alcohol use: No    Frequency: Never  . Drug use: Not on file     Allergies   Patient has no known allergies.  Review of Systems Review of Systems  Constitutional: Negative for fever.  HENT: Positive for congestion, rhinorrhea and tinnitus. Negative for ear pain and sore throat.   Eyes: Negative for visual disturbance.  Respiratory: Negative for shortness of breath.   Cardiovascular: Negative for chest pain.  Gastrointestinal: Positive for nausea and vomiting. Negative for abdominal pain, constipation and diarrhea.  Genitourinary: Negative for difficulty urinating.  Musculoskeletal: Negative for back pain and neck stiffness.  Skin: Negative for rash.  Neurological: Positive for dizziness. Negative for syncope, facial asymmetry, weakness, numbness and headaches.     Physical  Exam Updated Vital Signs BP (!) 146/78   Pulse 64   Temp 98 F (36.7 C) (Oral)   Resp 12   Ht 5\' 10"  (1.778 m)   Wt 96.6 kg (213 lb)   SpO2 97%   BMI 30.56 kg/m   Physical Exam  Constitutional: He is oriented to person, place, and time. He appears well-developed and well-nourished. No distress.  HENT:  Head: Normocephalic and atraumatic.  Eyes: Conjunctivae and EOM are normal.  Neck: Normal range of motion.  Cardiovascular: Normal rate, regular rhythm, normal heart sounds and intact distal pulses. Exam reveals no gallop and no friction rub.  No murmur heard. Pulmonary/Chest: Effort normal and breath sounds normal. No respiratory distress. He has no wheezes. He has no rales.  Abdominal: Soft. He exhibits no distension. There is no tenderness. There is no guarding.  Musculoskeletal: He exhibits no edema.  Neurological: He is alert and oriented to person, place, and time. He has normal strength. No cranial nerve deficit or sensory deficit. Gait abnormal. Coordination normal. GCS eye subscore is 4. GCS verbal subscore is 5. GCS motor subscore is 6.  Query left shoulder sensation of numbness, rest of arm normal, cannot tell if it is due to blood pressure cuff No other abnormalities of sensation, strength Ambulation deferred on initial exam Normal coordination Horizontal nystagmus, beating towards the right HINTs with deviation of gaze with head movements   Skin: Skin is warm and dry. He is not diaphoretic.  Nursing note and vitals reviewed.    ED Treatments / Results  Labs (all labs ordered are listed, but only abnormal results are displayed) Labs Reviewed  CBC - Abnormal; Notable for the following components:      Result Value   WBC 16.2 (*)    All other components within normal limits  DIFFERENTIAL - Abnormal; Notable for the following components:   Neutro Abs 14.4 (*)    All other components within normal limits  COMPREHENSIVE METABOLIC PANEL - Abnormal; Notable for the  following components:   Glucose, Bld 171 (*)    Total Bilirubin 1.3 (*)    All other components within normal limits  CBG MONITORING, ED - Abnormal; Notable for the following components:   Glucose-Capillary 168 (*)    All other components within normal limits  I-STAT CHEM 8, ED - Abnormal; Notable for the following components:   Glucose, Bld 171 (*)    Calcium, Ion 1.07 (*)    All other components within normal limits  PROTIME-INR  APTT  I-STAT TROPONIN, ED    EKG EKG Interpretation  Date/Time:  Wednesday March 25 2018 20:40:03 EDT Ventricular Rate:  87 PR Interval:  146 QRS Duration: 94 QT Interval:  376 QTC Calculation: 452 R Axis:   -13 Text Interpretation:  Normal sinus rhythm Normal ECG No previous ECGs available Confirmed by Gareth Morgan [phone]) on 03/25/2018 11:05:07 PM   Radiology Ct  Head Wo Contrast  Result Date: 03/25/2018 CLINICAL DATA:  Extreme dizziness with nausea and vomiting EXAM: CT HEAD WITHOUT CONTRAST TECHNIQUE: Contiguous axial images were obtained from the base of the skull through the vertex without intravenous contrast. COMPARISON:  None. FINDINGS: Brain: No acute territorial infarction, hemorrhage or intracranial mass. Mild atrophy. Nonenlarged ventricles. Vascular: No hyperdense vessels.  No unexpected calcification Skull: No fracture Sinuses/Orbits: Postsurgical changes of the maxillary and ethmoid sinuses with mucosal thickening present. No acute orbital abnormality. 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 IMPRESSION: 1. No CT evidence for acute intracranial abnormality. 2. Mild atrophy. Electronically Signed   By: Donavan Foil M.D.   On: 03/25/2018 21:12   Mr Brain Wo Contrast  Result Date: 03/26/2018 CLINICAL DATA:  Nausea, vomiting and dizziness. EXAM: MRI HEAD WITHOUT CONTRAST TECHNIQUE: Multiplanar, multiecho pulse sequences of the brain and surrounding structures were obtained without intravenous contrast. COMPARISON:  Head CT  03/25/2018 FINDINGS: BRAIN: The midline structures are normal. There is a punctate focus of abnormal diffusion restriction within the superior left parietal white matter, measuring approximately 5 mm. No associated hemorrhage or mass effect. No mass lesion, hydrocephalus, dural abnormality or extra-axial collection. The brain parenchymal signal is normal for the patient's age. No age-advanced or lobar predominant atrophy. No chronic microhemorrhage or superficial siderosis. VASCULAR: Major intracranial arterial and venous sinus flow voids are preserved. SKULL AND UPPER CERVICAL SPINE: The visualized skull base, calvarium, upper cervical spine and extracranial soft tissues are normal. SINUSES/ORBITS: No fluid levels or advanced mucosal thickening. No mastoid or middle ear effusion. Normal orbits. IMPRESSION: Punctate focus of acute ischemia within the left parietal white matter, without hemorrhage or mass effect. Otherwise normal MRI of the brain. Electronically Signed   By: Ulyses Jarred M.D.   On: 03/26/2018 04:19    Procedures Procedures (including critical care time)  Medications Ordered in ED Medications  hydrALAZINE (APRESOLINE) injection 10 mg (has no administration in time range)  atorvastatin (LIPITOR) tablet 80 mg (has no administration in time range)  diazepam (VALIUM) tablet 2 mg (has no administration in time range)  fluticasone (FLONASE) 50 MCG/ACT nasal spray 1 spray (has no administration in time range)  meclizine (ANTIVERT) tablet 25 mg (has no administration in time range)  pantoprazole (PROTONIX) EC tablet 40 mg (has no administration in time range)   stroke: mapping our early stages of recovery book (has no administration in time range)  0.9 %  sodium chloride infusion (has no administration in time range)  acetaminophen (TYLENOL) tablet 650 mg (has no administration in time range)    Or  acetaminophen (TYLENOL) solution 650 mg (has no administration in time range)    Or    acetaminophen (TYLENOL) suppository 650 mg (has no administration in time range)  enoxaparin (LOVENOX) injection 40 mg (has no administration in time range)  aspirin suppository 300 mg (has no administration in time range)    Or  aspirin tablet 325 mg (has no administration in time range)  ondansetron (ZOFRAN-ODT) 4 MG disintegrating tablet (4 mg  Given 03/25/18 2046)  ondansetron (ZOFRAN) injection 4 mg (4 mg Intravenous Given 03/26/18 0002)  sodium chloride 0.9 % bolus 1,000 mL (0 mLs Intravenous Stopped 03/26/18 0058)  meclizine (ANTIVERT) tablet 25 mg (25 mg Oral Given 03/26/18 0004)  diazepam (VALIUM) injection 4 mg (4 mg Intravenous Given 03/26/18 0135)  meclizine (ANTIVERT) tablet 25 mg (25 mg Oral Given 03/26/18 0250)     Initial Impression / Assessment and Plan / ED Course  I have reviewed the triage vital signs and the nursing notes.  Pertinent labs & imaging results that were available during my care of the patient were reviewed by me and considered in my medical decision making (see chart for details).    72 year old male with a history of hypertension, hyperlipidemia, TIA, presents with concern for vertigo, nausea and vomiting.  History and exam at this time are most consistent with a peripheral vertigo, with severe room spinning symptoms brought on by movements, and improved with rest and closing eyes, horizontal right beating nystagmus, and positive HINTs exam.   Plan to give IV nausea medication, meclizine, and reevaluate.  On reevaluation, patient with continuing vertigo. Given valium and additional meclizine. Patient with improvement but still unable to ambulate, now reports he has diplopia.  Will order MR for evaluation.  MR shows parietal stroke, not consistent with vertiginous symptoms. Dr. Lorraine Lax consulted and noted rotational nystagmus with upward gaze, diplopia with gaze. Concern for posterior circulation CVA which we cannot see with MRI. Will admit for further CVA work up.    Final Clinical Impressions(s) / ED Diagnoses   Final diagnoses:  Cerebrovascular accident (CVA), unspecified mechanism [facility])    ED Discharge Orders    None       Gareth Morgan, MD 03/26/18 [phone]

## 2018-03-25 NOTE — ED Triage Notes (Signed)
Pt endorses vertigo sx with n/v and dizziness that began this morning at 0800 upon awaking. Pt Contacted pcp and they called him in a prescription for zofran and meclizine but pt cannot keep it down. NO neuro deficits, VSS.

## 2018-03-26 ENCOUNTER — Inpatient Hospital Stay (HOSPITAL_COMMUNITY): Payer: Medicare HMO

## 2018-03-26 ENCOUNTER — Other Ambulatory Visit: Payer: Self-pay

## 2018-03-26 ENCOUNTER — Encounter (HOSPITAL_COMMUNITY): Payer: Self-pay | Admitting: Nurse Practitioner

## 2018-03-26 ENCOUNTER — Emergency Department (HOSPITAL_COMMUNITY): Payer: Medicare HMO

## 2018-03-26 DIAGNOSIS — E785 Hyperlipidemia, unspecified: Secondary | ICD-10-CM | POA: Diagnosis not present

## 2018-03-26 DIAGNOSIS — G4733 Obstructive sleep apnea (adult) (pediatric): Secondary | ICD-10-CM

## 2018-03-26 DIAGNOSIS — Z9989 Dependence on other enabling machines and devices: Secondary | ICD-10-CM | POA: Diagnosis not present

## 2018-03-26 DIAGNOSIS — E7439 Other disorders of intestinal carbohydrate absorption: Secondary | ICD-10-CM | POA: Diagnosis present

## 2018-03-26 DIAGNOSIS — R42 Dizziness and giddiness: Secondary | ICD-10-CM | POA: Diagnosis not present

## 2018-03-26 DIAGNOSIS — I351 Nonrheumatic aortic (valve) insufficiency: Secondary | ICD-10-CM

## 2018-03-26 DIAGNOSIS — I10 Essential (primary) hypertension: Secondary | ICD-10-CM | POA: Diagnosis present

## 2018-03-26 DIAGNOSIS — E669 Obesity, unspecified: Secondary | ICD-10-CM | POA: Diagnosis present

## 2018-03-26 DIAGNOSIS — K5790 Diverticulosis of intestine, part unspecified, without perforation or abscess without bleeding: Secondary | ICD-10-CM | POA: Diagnosis not present

## 2018-03-26 DIAGNOSIS — H81399 Other peripheral vertigo, unspecified ear: Secondary | ICD-10-CM | POA: Diagnosis present

## 2018-03-26 DIAGNOSIS — I159 Secondary hypertension, unspecified: Secondary | ICD-10-CM | POA: Diagnosis not present

## 2018-03-26 DIAGNOSIS — K219 Gastro-esophageal reflux disease without esophagitis: Secondary | ICD-10-CM | POA: Diagnosis present

## 2018-03-26 DIAGNOSIS — I639 Cerebral infarction, unspecified: Principal | ICD-10-CM

## 2018-03-26 DIAGNOSIS — Z8042 Family history of malignant neoplasm of prostate: Secondary | ICD-10-CM | POA: Diagnosis not present

## 2018-03-26 DIAGNOSIS — R739 Hyperglycemia, unspecified: Secondary | ICD-10-CM | POA: Diagnosis present

## 2018-03-26 DIAGNOSIS — F988 Other specified behavioral and emotional disorders with onset usually occurring in childhood and adolescence: Secondary | ICD-10-CM | POA: Diagnosis present

## 2018-03-26 DIAGNOSIS — E8881 Metabolic syndrome: Secondary | ICD-10-CM | POA: Diagnosis not present

## 2018-03-26 DIAGNOSIS — Z6830 Body mass index (BMI) 30.0-30.9, adult: Secondary | ICD-10-CM | POA: Diagnosis not present

## 2018-03-26 DIAGNOSIS — Z7982 Long term (current) use of aspirin: Secondary | ICD-10-CM | POA: Diagnosis not present

## 2018-03-26 DIAGNOSIS — H9319 Tinnitus, unspecified ear: Secondary | ICD-10-CM | POA: Diagnosis not present

## 2018-03-26 DIAGNOSIS — Z974 Presence of external hearing-aid: Secondary | ICD-10-CM | POA: Diagnosis not present

## 2018-03-26 DIAGNOSIS — Z7951 Long term (current) use of inhaled steroids: Secondary | ICD-10-CM | POA: Diagnosis not present

## 2018-03-26 DIAGNOSIS — Z8673 Personal history of transient ischemic attack (TIA), and cerebral infarction without residual deficits: Secondary | ICD-10-CM | POA: Diagnosis not present

## 2018-03-26 DIAGNOSIS — E860 Dehydration: Secondary | ICD-10-CM | POA: Diagnosis present

## 2018-03-26 DIAGNOSIS — D72829 Elevated white blood cell count, unspecified: Secondary | ICD-10-CM | POA: Diagnosis present

## 2018-03-26 LAB — ECHOCARDIOGRAM COMPLETE
Height: 70 in
Weight: 3408 oz

## 2018-03-26 LAB — URINALYSIS, ROUTINE W REFLEX MICROSCOPIC
Bilirubin Urine: NEGATIVE
Glucose, UA: 50 mg/dL — AB
HGB URINE DIPSTICK: NEGATIVE
Ketones, ur: 5 mg/dL — AB
Leukocytes, UA: NEGATIVE
Nitrite: NEGATIVE
Protein, ur: NEGATIVE mg/dL
SPECIFIC GRAVITY, URINE: 1.017 (ref 1.005–1.030)
pH: 6 (ref 5.0–8.0)

## 2018-03-26 MED ORDER — ATORVASTATIN CALCIUM 80 MG PO TABS
80.0000 mg | ORAL_TABLET | Freq: Every day | ORAL | Status: DC
  Administered 2018-03-26: 80 mg via ORAL
  Filled 2018-03-26 (×2): qty 1

## 2018-03-26 MED ORDER — SODIUM CHLORIDE 0.9 % IV SOLN
INTRAVENOUS | Status: DC
  Administered 2018-03-26 – 2018-03-27 (×2): via INTRAVENOUS

## 2018-03-26 MED ORDER — ACETAMINOPHEN 325 MG PO TABS: 650.0000 mg | ORAL_TABLET | ORAL | Status: DC | PRN

## 2018-03-26 MED ORDER — IOPAMIDOL (ISOVUE-370) INJECTION 76%
50.0000 mL | Freq: Once | INTRAVENOUS | Status: DC | PRN
Start: 2018-03-26 — End: 2018-03-27

## 2018-03-26 MED ORDER — DIAZEPAM 2 MG PO TABS: 2.0000 mg | ORAL_TABLET | Freq: Four times a day (QID) | ORAL | Status: DC | PRN

## 2018-03-26 MED ORDER — ENOXAPARIN SODIUM 40 MG/0.4ML ~~LOC~~ SOLN
40.0000 mg | SUBCUTANEOUS | Status: DC
  Administered 2018-03-27: 40 mg via SUBCUTANEOUS
  Filled 2018-03-26 (×2): qty 0.4

## 2018-03-26 MED ORDER — ASPIRIN 325 MG PO TABS
325.0000 mg | ORAL_TABLET | Freq: Every day | ORAL | Status: DC
  Administered 2018-03-26: 325 mg via ORAL
  Filled 2018-03-26: qty 1

## 2018-03-26 MED ORDER — ACETAMINOPHEN 650 MG RE SUPP: 650.0000 mg | RECTAL | Status: DC | PRN

## 2018-03-26 MED ORDER — ACETAMINOPHEN 160 MG/5ML PO SOLN: 650.0000 mg | ORAL | Status: DC | PRN

## 2018-03-26 MED ORDER — ASPIRIN 300 MG RE SUPP: 300.0000 mg | Freq: Every day | RECTAL | Status: DC

## 2018-03-26 MED ORDER — IOPAMIDOL (ISOVUE-370) INJECTION 76%
INTRAVENOUS | Status: AC
  Administered 2018-03-26: 50 mL
  Filled 2018-03-26: qty 50

## 2018-03-26 MED ORDER — HYDRALAZINE HCL 20 MG/ML IJ SOLN: 10.0000 mg | INTRAMUSCULAR | Status: DC | PRN

## 2018-03-26 MED ORDER — MECLIZINE HCL 25 MG PO TABS
25.0000 mg | ORAL_TABLET | Freq: Once | ORAL | Status: AC
Start: 2018-03-26 — End: 2018-03-26
  Administered 2018-03-26: 25 mg via ORAL
  Filled 2018-03-26: qty 1

## 2018-03-26 MED ORDER — PANTOPRAZOLE SODIUM 40 MG PO TBEC
40.0000 mg | DELAYED_RELEASE_TABLET | Freq: Every day | ORAL | Status: DC
  Administered 2018-03-26 – 2018-03-27 (×2): 40 mg via ORAL
  Filled 2018-03-26 (×2): qty 1

## 2018-03-26 MED ORDER — MECLIZINE HCL 12.5 MG PO TABS: 25.0000 mg | ORAL_TABLET | Freq: Three times a day (TID) | ORAL | Status: DC | PRN

## 2018-03-26 MED ORDER — FLUTICASONE PROPIONATE 50 MCG/ACT NA SUSP: 1.0000 | Freq: Every day | NASAL | Status: DC | PRN

## 2018-03-26 MED ORDER — CLOPIDOGREL BISULFATE 75 MG PO TABS
75.0000 mg | ORAL_TABLET | Freq: Every day | ORAL | Status: DC
  Administered 2018-03-26 – 2018-03-27 (×2): 75 mg via ORAL
  Filled 2018-03-26 (×2): qty 1

## 2018-03-26 MED ORDER — STROKE: EARLY STAGES OF RECOVERY BOOK
Freq: Once | Status: DC
  Filled 2018-03-26: qty 1

## 2018-03-26 MED ORDER — DIAZEPAM 5 MG/ML IJ SOLN
4.0000 mg | Freq: Once | INTRAMUSCULAR | Status: AC
  Administered 2018-03-26: 4 mg via INTRAVENOUS
  Filled 2018-03-26: qty 2

## 2018-03-26 NOTE — ED Notes (Signed)
PT taken to restroom via wheelchair

## 2018-03-26 NOTE — ED Notes (Signed)
Patient transported to MRI 

## 2018-03-26 NOTE — ED Notes (Signed)
Patient transported to CT 

## 2018-03-26 NOTE — ED Notes (Signed)
Patient is stable and ready to be transport to the floor at this time.  Report was called to 3W RN.  Belongings taken with the patient to the floor.   

## 2018-03-26 NOTE — Consult Note (Signed)
Neurology Consultation  Reason for Consult: Stroke Referring Physician: Dr. Billy Fischer  CC: Dizziness  History is obtained from: Patient, chart  HPI: Chris [name] is a 72 y.o. male who has a past medical history of hypertension, hyperlipidemia, glucose intolerance, obstructive sleep apnea compliant with CPAP, history of TIA, who woke up on the morning of 03/25/2018 at around 8 AM with dizziness. He says that his normal morning ritual is taking off his CPAP, turning to his left, hanging the mask on the side of his bed, during which he usually sometimes gets dizziness which she describes as room spinning for a few seconds but Wednesday morning, this dizziness persisted.  He did not make much of it thinking that it will go away but it did not resolve on its own.  It became problematic to the extent where it became difficult to walk.  He said he felt a little better if he laid in bed and kept his eyes closed.  As soon as he open his eyes or attempted to walk, he was extremely vertiginous.  He also reported nausea and at least 10-20 episodes of vomiting.  He denies any tingling or numbness associated with it.  Denies any headache but says that his head feels a little heavy.  Denies any frank weakness associated with the symptoms.  Denies any preceding fevers or chills.  Denies any shortness of breath chest pain.  Denies any acute hearing loss or ear pain.  Patient reports that he has had no frank speech difficulties or slurred speech but he has been having intermittent difficulty with his speech being "slow" since this morning. Overall he reports all of his symptoms are getting slightly better. ER course: He came to the emergency room where he was evaluated by the ED provider.  Noncontrast CT of the head was unremarkable.  He was given IV fluids and meclizine with no relief.  He was also given a dose of Valium which helps some but his symptoms persisted.  Because of the persistence of the symptoms and no  response to treatment, an MRI of the brain was obtained that showed a punctate area of acute ischemia within the left parietal white matter without any hemorrhage or mass-effect.   LKW: 10 PM on 03/24/2018.  Woke up with symptoms at 8:00 on 03/25/2018. tpa given?: no, outside the window Premorbid modified Rankin scale (mRS): 0  ROS: ROS was performed and is negative except as noted in the HPI.   Past Medical History:  Diagnosis Date  . ADD (attention deficit disorder)   . Diverticulosis   . GERD (gastroesophageal reflux disease)   . Hyperlipidemia   . Hypertension   . IFG (impaired fasting glucose)   . Metabolic syndrome   . OSA (obstructive sleep apnea)   . Shingles   . TIA (transient ischemic attack)    Family History  Problem Relation Age of Onset  . Prostate cancer Father     Social History:   reports that he has never smoked. He has never used smokeless tobacco. He reports that he does not drink alcohol. His drug history is not on file.  Medications No current facility-administered medications for this encounter.   Current Outpatient Medications:  .  aspirin EC 81 MG tablet, Take 81 mg by mouth 3 (three) times a week. , Disp: , Rfl:  .  atorvastatin (LIPITOR) 40 MG tablet, Take 40 mg by mouth daily., Disp: , Rfl:  .  fluticasone (FLONASE) 50 MCG/ACT nasal spray, Place  1 spray into both nostrils daily as needed for allergies. , Disp: , Rfl:  .  meclizine (ANTIVERT) 25 MG tablet, Take 25 mg by mouth 3 (three) times daily as needed for dizziness or nausea. , Disp: , Rfl:  .  ondansetron (ZOFRAN) 4 MG tablet, Take 4 mg by mouth 3 (three) times daily as needed for nausea. , Disp: , Rfl:  .  pantoprazole (PROTONIX) 40 MG tablet, Take 40 mg by mouth daily., Disp: , Rfl:  .  telmisartan (MICARDIS) 80 MG tablet, Take 80 mg by mouth daily., Disp: , Rfl:   Exam: Current vital signs: BP 139/86   Pulse 85   Temp 98 F (36.7 C) (Oral)   Resp 17   Ht 5\' 10"  (1.778 m)   Wt 96.6 kg  (213 lb)   SpO2 96%   BMI 30.56 kg/m  Vital signs in last 24 hours: Temp:  [98 F (36.7 C)] 98 F (36.7 C) (04/03 2012) Pulse Rate:  [71-88] 85 (04/04 0515) Resp:  [10-22] 17 (04/04 0515) BP: (128-171)/(65-86) 139/86 (04/04 0515) SpO2:  [93 %-98 %] 96 % (04/04 0515) Weight:  [96.6 kg (213 lb)] 96.6 kg (213 lb) (04/03 2030)  GENERAL: Awake, alert in NAD HEENT: - Normocephalic and atraumatic, dry mm, no LN++, no Thyromegally LUNGS - Clear to auscultation bilaterally with no wheezes CV - S1S2 RRR, no m/r/g, equal pulses bilaterally. ABDOMEN - Soft, nontender, nondistended with normoactive BS Ext: warm, well perfused, intact peripheral pulses, no edema  NEURO:  Mental Status: AA&Ox3  Language: speech is clear.  Naming, repetition, fluency, and comprehension intact. Cranial Nerves: PERRL. EOM testing shows mildly disconjugate eye movements, bilateral end gaze nystagmus which is worse on rightward gaze and he also reports diplopia on the rightward gaze, there is also nystagmus noted on upward gaze which is rotational, visual fields full, no facial asymmetry, facial sensation intact, hearing intact, tongue/uvula/soft palate midline, normal sternocleidomastoid and trapezius muscle strength. No evidence of tongue atrophy or fibrillations Motor: 5/5 all over.  No vertical drift. Tone: is normal and bulk is normal Sensation- Intact to light touch bilaterally Coordination: FTN intact bilaterally, no ataxia in BLE. Gait-reportedly very ataxic when attempted to walk by ED provider. HINTS test- showed +head impulse, +nystagmus (Horizontal but also vertical torsional) and +skew   NIHSS 1a Level of Conscious.: 0 1b LOC Questions: 0 1c LOC Commands: 0 2 Best Gaze: 1 3 Visual: 0 4 Facial Palsy: 0 5a Motor Arm - left: 0 5b Motor Arm - Right: 0 6a Motor Leg - Left: 0 6b Motor Leg - Right: 0 7 Limb Ataxia: 0 8 Sensory: 0 9 Best Language: 0 10 Dysarthria: 0 11 Extinct. and Inatten.: 0 TOTAL:  1  Labs I have reviewed labs in epic and the results pertinent to this consultation are:  CBC    Component Value Date/Time   WBC 16.2 (H) 03/25/2018 2039   RBC 5.00 03/25/2018 2039   HGB 15.6 03/25/2018 2052   HCT 46.0 03/25/2018 2052   PLT 261 03/25/2018 2039   MCV 89.0 03/25/2018 2039   MCH 30.6 03/25/2018 2039   MCHC 34.4 03/25/2018 2039   RDW 13.8 03/25/2018 2039   LYMPHSABS 1.2 03/25/2018 2039   MONOABS 0.6 03/25/2018 2039   EOSABS 0.0 03/25/2018 2039   BASOSABS 0.0 03/25/2018 2039    CMP     Component Value Date/Time   NA 139 03/25/2018 2052   K 3.9 03/25/2018 2052   CL 103 03/25/2018 2052  CO2 22 03/25/2018 2039   GLUCOSE 171 (H) 03/25/2018 2052   BUN 11 03/25/2018 2052   CREATININE 1.00 03/25/2018 2052   CALCIUM 9.2 03/25/2018 2039   PROT 7.4 03/25/2018 2039   ALBUMIN 4.5 03/25/2018 2039   AST 25 03/25/2018 2039   ALT 35 03/25/2018 2039   ALKPHOS 92 03/25/2018 2039   BILITOT 1.3 (H) 03/25/2018 2039   GFRNONAA >60 03/25/2018 2039   GFRAA >60 03/25/2018 2039   Imaging I have reviewed the images obtained: CT-scan of the brain-no acute changes  MRI examination of the brain-punctate area of restricted diffusion in the left parietal lobe.    There might be an additional focus of restricted diffusion in the brainstem v artifact (not mentioned in official read)- might need to d/w Neurorads again in the AM.   Assessment:  72 year old man with history of hypertension hyperlipidemia presented to the emergency room for evaluation of vertigo that started the morning of 03/25/2018 when he woke up. His last known normal was the night prior.  Outside the window for TPA. Examination reveals a horizontal nystagmus with diplopia while looking to the right, and also torsional nystagmus on upward gaze.  HINTS exam is not all that useful as he has all 3 components positive but I strongly suspect that he has a small brainstem stroke on top of the left parietal lobe stroke that  has been demonstrated on the MRI, with a brainstem stroke probably not being completely visualized because of the slice thickness and slice gaps on MRI sequences. The left parietal stroke can not explain his symptoms. I would look for a cardiac source given a very small left parietal stroke and a possible brainstem stroke.  The stroke in the left parietal area, as well as the presumed brainstem stroke could both be lacunars as well although the location in the parietal areas not typical for lacunar infarcts.  Differentials at this time: -Acute ischemic stroke- cardio embolic versus small vessel -Peripheral vertigo   Recommendations: -Admit to hospitalist -Telemetry, might need long-term cardiac monitoring- i.e. 30-day monitoring or loop recorder. -Frequent neuro checks -Use eye patch for symptomatic relief -CTA head/neck -Allow for permissive hypertension-treat only if systolic blood pressures over 220 on a as needed basis.  Blood pressure should be normalized to 140/90 or below on discharge. -2D echo -Hemoglobin A1c -Lipid panel -Aspirin 325 -Atorvastatin 80 -PT, OT, ST consults -Vestibular rehab -Symptomatic treatment of the vertigo with IV fluids, meclizine and Valium as needed. -Might need to repeat MRI with thin cuts through the brainstem for confirmation of brainstem stroke.  Please page stroke NP/PA/MD (listed on AMION)  from 8am-4 pm as this patient will be followed by the stroke team at this point.  -- Amie Portland, MD [facility] Pager: [phone] If 7pm to 7am, please call on call as listed on AMION.

## 2018-03-26 NOTE — ED Notes (Signed)
Service Response called and a clear liquid tray was ordered, per Velna Hatchet, RN.

## 2018-03-26 NOTE — ED Notes (Signed)
PT. Return from MRI via stretcher.

## 2018-03-26 NOTE — Progress Notes (Signed)
  Echocardiogram 2D Echocardiogram has been performed.  Johny Chess 03/26/2018, 5:03 PM

## 2018-03-26 NOTE — Progress Notes (Signed)
STROKE TEAM PROGRESS NOTE  Chris [name] is a 72 y.o. male who has a past medical history of hypertension, hyperlipidemia, glucose intolerance, obstructive sleep apnea compliant with CPAP, history of TIA, who woke up on the morning of 03/25/2018 at around 8 AM with dizziness. He says that his normal morning ritual is taking off his CPAP, turning to his left, hanging the mask on the side of his bed, during which he usually sometimes gets dizziness which she describes as room spinning for a few seconds but Wednesday morning, this dizziness persisted.  He did not make much of it thinking that it will go away but it did not resolve on its own.  It became problematic to the extent where it became difficult to walk.  He said he felt a little better if he laid in bed and kept his eyes closed.  As soon as he open his eyes or attempted to walk, he was extremely vertiginous.  He also reported nausea and at least 10-20 episodes of vomiting.  He denies any tingling or numbness associated with it.  Denies any headache but says that his head feels a little heavy.  Denies any frank weakness associated with the symptoms.  Denies any preceding fevers or chills.  Denies any shortness of breath chest pain.  Denies any acute hearing loss or ear pain.  Patient reports that he has had no frank speech difficulties or slurred speech but he has been having intermittent difficulty with his speech being "slow" since this morning. Overall he reports all of his symptoms are getting slightly better. ER course: He came to the emergency room where he was evaluated by the ED provider.  Noncontrast CT of the head was unremarkable.  He was given IV fluids and meclizine with no relief.  He was also given a dose of Valium which helps some but his symptoms persisted.  Because of the persistence of the symptoms and no response to treatment, an MRI of the brain was obtained that showed a punctate area of acute ischemia within the left parietal white  matter without any hemorrhage or mass-effect.   LKW: 10 PM on 03/24/2018.  Woke up with symptoms at 8:00 on 03/25/2018. tpa given?: no, outside the window Premorbid modified Rankin scale (mRS): 0   INTERVAL HISTORY  patient states he developed sudden onset of vertigo upon awakening with some dizziness, imbalance and blurred vision which appears to be improving but is not back to baseline.he still has horizontal diplopia on extreme lateral gaze more on the right and left side.He denies any focal symptoms involving his right hand. He has no prior history of strokes.  CBC:  CBC Latest Ref Rng & Units 03/25/2018 03/25/2018  WBC 4.0 - 10.5 K/uL - 16.2(H)  Hemoglobin 13.0 - 17.0 g/dL 15.6 15.3  Hematocrit 39.0 - 52.0 % 46.0 44.5  Platelets 150 - 400 K/uL - 261    Comprehensive Metabolic Panel:   CMP Latest Ref Rng & Units 03/25/2018 03/25/2018  Glucose 65 - 99 mg/dL 171(H) 171(H)  BUN 6 - 20 mg/dL 11 10  Creatinine 0.61 - 1.24 mg/dL 1.00 1.14  Sodium 135 - 145 mmol/L 139 137  Potassium 3.5 - 5.1 mmol/L 3.9 3.7  Chloride 101 - 111 mmol/L 103 102  CO2 22 - 32 mmol/L - 22  Calcium 8.9 - 10.3 mg/dL - 9.2  Total Protein 6.5 - 8.1 g/dL - 7.4  Total Bilirubin 0.3 - 1.2 mg/dL - 1.3(H)  Alkaline Phos 38 -  126 U/L - 92  AST 15 - 41 U/L - 25  ALT 17 - 63 U/L - 35   Lipid Panel: No results found for: CHOL, TRIG, HDL, CHOLHDL, VLDL, LDLCALC HgbA1c: No results found for: HGBA1C Urine Drug Screen: No results found for: LABOPIA, COCAINSCRNUR, LABBENZ, AMPHETMU, THCU, LABBARB  Alcohol Level No results found for: ETH  Vitals:   03/26/18 1400 03/26/18 1415 03/26/18 1445 03/26/18 1515  BP: (!) 149/78 140/84 (!) 144/74 (!) 144/74  Pulse: 68 83 79 94  Resp: 12 14 15 17   Temp:      TempSrc:      SpO2: 99% 100% 96% 99%  Weight:      Height:       Pleasant elderly Caucasian male not in distress. Sitting up comfortably in bed. . Afebrile. Head is nontraumatic. Neck is supple without bruit.    Cardiac exam no  murmur or gallop. Lungs are clear to auscultation. Distal pulses are well felt. Neurological Exam ;  Awake  Alert oriented x 3. Normal speech and language.eye movements full without nystagmus.fundi were not visualized. Vision acuity and fields appear normal. He has subjective horizontal diplopia which is binocular on extreme lateral gaze right more than left. Head shaking produces subjective dizziness without objective nystagmus noted.Hearing is normal. Palatal movements are normal. Face symmetric. Tongue midline. Normal strength, tone, reflexes and coordination. Normal sensation. Gait deferred.  ASSESSMENT/PLAN Chris [name] is a 72 y.o. male with history of HTN, HLD, glucose intolerance, OSA on CPAP, hx TIA presenting with dizziness upon awakening and had difficulty walking. .   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 Atrophy.   MRI  Punctate L parietal WM infarct  CTA head, CTA neck no significant stenosis, occlusion or dissection  2D Echo  pending   LDL pending   HgbA1c pending   Lovenox 40 mg sq daily for VTE prophylaxis Diet clear liquid Room service appropriate? Yes; Fluid consistency: Thin Fall precautions  aspirin 81 mg daily prior to admission, now on aspirin 325 mg daily. Given stroke on aspirin, recommend change to plavix. Orders written  Therapy recommendations:  pending   Disposition:  pending   Hypertension  Stable . Permissive hypertension (OK if < 220/120) but gradually normalize in 5-7 days . Long-term BP goal normotensive  Hyperlipidemia  Home meds:  lipitor 40  LDL pending, goal < 70  Now on lipitor 80  Continue statin at discharge  Glucose Intolerance  HgbA1c pending, goal < 7.0  Other Stroke Risk Factors  Advanced age  Obesity, Body mass index is 30.56 kg/m.  Hx TIA  Obstructive sleep apnea,  on CPAP at home  Other Active Problems  Leukocytosis - Likely secondary to dehydration and recurrent nausea and vomiting in the setting of vertigo symptoms  GERD  Hospital day # 0 I have personally examined this patient, reviewed notes, independently viewed imaging studies, participated in medical decision making and plan of care.ROS completed by me personally and pertinent positives fully documented  I have made any additions or clarifications directly to the above note. Agree with note above. He presented with sudden onset of vertigo and diplopia or dizziness likely due to a small brainstem infarct not visualized on MRI. Etiology likely small vessel disease. MRI shows incidental tiny punctate left parietal subcortical lacunar infarct. Recommend Plavix for stroke prevention as  patient states that aspirin causes stomach upset for him. Check ongoing stroke workup. Greater than 50% time during this 35 minute visit was spent on counseling and coordination of care about his lacunar stroke advancing questions.  Antony Contras, MD Medical Director [facility] Pager: [phone] 03/26/2018 5:06 PM        To contact Stroke Continuity provider, please refer to [email or web address] After hours, contact General Neurology

## 2018-03-26 NOTE — H&P (Addendum)
History and Physical    Chris [name] MWU:[id number] DOB: [birth year] DOA: 03/25/2018  **Will admit patient based on the expectation that the patient will need hospitalization/ hospital care that crosses at least 2 midnights  PCP: Marton Redwood, MD   Attending physician: Evangeline Gula  Patient coming from/Resides with: Private residence/wife  Chief Complaint: Dizziness and vertigo symptoms  HPI: Chris [name] is a 72 y.o. male with medical history significant for TIA, hypertension, dyslipidemia, glucose intolerance, sleep apnea on CPAP.  Patient awakened on the morning of 4/3 with dizziness and vertigo symptoms.  Symptoms persisted.  PCP was notified.  Zofran was ordered for nausea and meclizine was ordered for the vertigo symptoms. Symptoms did not improve and worsened with ambulation.  At least 10-20 episodes of nausea with vomiting.  No other neurological symptoms.  Patient presented to the ER for further evaluation.  He was treated symptomatically for vertigo with IV fluids and meclizine as well as Valium but symptoms persisted.  CT the head was unremarkable.  MRI of the brain demonstrated punctate area of acute ischemia left parietal lobe without hemorrhage or mass-effect.  Neurology was consulted and recommended admission for CVA evaluation.  ED Course:  Vital Signs: BP (!) 146/78   Pulse 64   Temp 98 F (36.7 C) (Oral)   Resp 12   Ht 5\' 10"  (1.778 m)   Wt 96.6 kg (213 lb)   SpO2 97%   BMI 30.56 kg/m  CT Without contrast and MRI brain: As above Lab data: Sodium 137, potassium 3.7, glucose 171, BUN 10, creatinine 1.14, anion gap 13, total bilirubin 1.3 otherwise LFTs normal, white count 16,200, neutrophils 89%, absolute neutrophils 14.4%, hemoglobin 15.3, platelets 261,000, coags are normal Medications and treatments: 4 mg p.o. x1, Zofran 4 mg IV x1, normal saline 1 L x1, meclizine 25 mg p.o. x2, Valium 4 mg IV x1  Review of Systems:  In addition to the HPI above,  No Fever-chills,  myalgias or other constitutional symptoms No Headache, changes with Vision or hearing, new weakness, tingling, numbness in any extremity,dysarthria or word finding difficulty, tremors or seizure activity No problems swallowing food or Liquids, indigestion/reflux, choking or coughing while eating, abdominal pain with or after eating No Chest pain, Cough or Shortness of Breath, palpitations, orthopnea or DOE No Abdominal pain, melena,hematochezia, dark tarry stools, constipation No dysuria, malodorous urine, hematuria or flank pain No new skin rashes, lesions, masses or bruises, No new joint pains, aches, swelling or redness No recent unintentional weight gain or loss No polyuria, polydypsia or polyphagia   Past Medical History:  Diagnosis Date  . ADD (attention deficit disorder)   . Diverticulosis   . GERD (gastroesophageal reflux disease)   . Hyperlipidemia   . Hypertension   . IFG (impaired fasting glucose)   . Metabolic syndrome   . OSA (obstructive sleep apnea)   . Shingles   . TIA (transient ischemic attack)     Past Surgical History:  Procedure Laterality Date  . HAND SURGERY Left    thumb surgery  . hearing aids Bilateral   . TONSILECTOMY, ADENOIDECTOMY, BILATERAL MYRINGOTOMY AND TUBES      Social History   Socioeconomic History  . Marital status: Married    Spouse name: Not on file  . Number of children: Not on file  . Years of education: Not on file  . Highest education level: Not on file  Occupational History  . Not on file  Social Needs  . Emergency planning/management officer  strain: Not on file  . Food insecurity:    Worry: Not on file    Inability: Not on file  . Transportation needs:    Medical: Not on file    Non-medical: Not on file  Tobacco Use  . Smoking status: Never Smoker  . Smokeless tobacco: Never Used  Substance and Sexual Activity  . Alcohol use: No    Frequency: Never  . Drug use: Not on file  . Sexual activity: Not on file  Lifestyle  . Physical  activity:    Days per week: Not on file    Minutes per session: Not on file  . Stress: Not on file  Relationships  . Social connections:    Talks on phone: Not on file    Gets together: Not on file    Attends religious service: Not on file    Active member of club or organization: Not on file    Attends meetings of clubs or organizations: Not on file    Relationship status: Not on file  . Intimate partner violence:    Fear of current or ex partner: Not on file    Emotionally abused: Not on file    Physically abused: Not on file    Forced sexual activity: Not on file  Other Topics Concern  . Not on file  Social History Narrative  . Not on file    Mobility: Independent Work history: Not obtained   No Known Allergies  Family History  Problem Relation Age of Onset  . Prostate cancer Father      Prior to Admission medications   Medication Sig Start Date End Date Taking? Authorizing Provider  aspirin EC 81 MG tablet Take 81 mg by mouth 3 (three) times a week.    Yes [provider]  atorvastatin (LIPITOR) 40 MG tablet Take 40 mg by mouth daily.   Yes [provider]  fluticasone (FLONASE) 50 MCG/ACT nasal spray Place 1 spray into both nostrils daily as needed for allergies.    Yes [provider]  meclizine (ANTIVERT) 25 MG tablet Take 25 mg by mouth 3 (three) times daily as needed for dizziness or nausea.  03/25/18  Yes [provider]  ondansetron (ZOFRAN) 4 MG tablet Take 4 mg by mouth 3 (three) times daily as needed for nausea.  03/25/18  Yes [provider]  pantoprazole (PROTONIX) 40 MG tablet Take 40 mg by mouth daily.   Yes [provider]  telmisartan (MICARDIS) 80 MG tablet Take 80 mg by mouth daily.   Yes [provider]    Physical Exam: Vitals:   03/26/18 0615 03/26/18 0630 03/26/18 0645 03/26/18 0700  BP: (!) 145/91 132/77 135/75 (!) 146/78  Pulse: 81 72 74 64  Resp: 18 12 16 12   Temp:        TempSrc:      SpO2: 96% 93% 97% 97%  Weight:      Height:          Constitutional: NAD, calm, comfortable Eyes: PERRL, lids and conjunctivae normal ENMT: Mucous membranes are dry. Posterior pharynx clear of any exudate or lesions.Normal dentition.  Neck: normal, supple, no masses, no thyromegaly Respiratory: clear to auscultation bilaterally, no wheezing, no crackles. Normal respiratory effort. No accessory muscle use.  Cardiovascular: Regular rate and rhythm, no murmurs / rubs / gallops. No extremity edema. 2+ pedal pulses. No carotid bruits.  Abdomen: no tenderness, no masses palpated. No hepatosplenomegaly. Bowel sounds positive.  Musculoskeletal: no  clubbing / cyanosis. No joint deformity upper and lower extremities. Good ROM, no contractures. Normal muscle tone.  Skin: no rashes, lesions, ulcers. No induration Neurologic: CN 2-12 grossly intact except for mild horizontal nystagmus slightly more noticeable with right gaze testing. Sensation intact, DTR normal. Strength 5/5 x all 4 extremities.  Psychiatric: Normal judgment and insight. Alert and oriented x 3. Normal mood.    Labs on Admission: I have personally reviewed following labs and imaging studies  CBC: Recent Labs  Lab 03/25/18 2039 03/25/18 2052  WBC 16.2*  --   NEUTROABS 14.4*  --   HGB 15.3 15.6  HCT 44.5 46.0  MCV 89.0  --   PLT 261  --    Basic Metabolic Panel: Recent Labs  Lab 03/25/18 2039 03/25/18 2052  NA 137 139  K 3.7 3.9  CL 102 103  CO2 22  --   GLUCOSE 171* 171*  BUN 10 11  CREATININE 1.14 1.00  CALCIUM 9.2  --    GFR: Estimated Creatinine Clearance: 76.7 mL/min (by C-G formula based on SCr of 1 mg/dL). Liver Function Tests: Recent Labs  Lab 03/25/18 2039  AST 25  ALT 35  ALKPHOS 92  BILITOT 1.3*  PROT 7.4  ALBUMIN 4.5   No results for input(s): LIPASE, AMYLASE in the last 168 hours. No results for input(s): AMMONIA in the last 168 hours. Coagulation Profile: Recent Labs   Lab 03/25/18 2039  INR 1.10   Cardiac Enzymes: No results for input(s): CKTOTAL, CKMB, CKMBINDEX, TROPONINI in the last 168 hours. BNP (last 3 results) No results for input(s): PROBNP in the last 8760 hours. HbA1C: No results for input(s): HGBA1C in the last 72 hours. CBG: Recent Labs  Lab 03/25/18 2309  GLUCAP 168*   Lipid Profile: No results for input(s): CHOL, HDL, LDLCALC, TRIG, CHOLHDL, LDLDIRECT in the last 72 hours. Thyroid Function Tests: No results for input(s): TSH, T4TOTAL, FREET4, T3FREE, THYROIDAB in the last 72 hours. Anemia Panel: No results for input(s): VITAMINB12, FOLATE, FERRITIN, TIBC, IRON, RETICCTPCT in the last 72 hours. Urine analysis: No results found for: COLORURINE, APPEARANCEUR, LABSPEC, PHURINE, GLUCOSEU, HGBUR, BILIRUBINUR, KETONESUR, PROTEINUR, UROBILINOGEN, NITRITE, LEUKOCYTESUR Sepsis Labs: @LABRCNTIP (procalcitonin:4,lacticidven:4) )No results found for this or any previous visit (from the past 240 hour(s)).   Radiological Exams on Admission: Ct Head Wo Contrast  Result Date: 03/25/2018 CLINICAL DATA:  Extreme dizziness with nausea and vomiting EXAM: CT HEAD WITHOUT CONTRAST TECHNIQUE: Contiguous axial images were obtained from the base of the skull through the vertex without intravenous contrast. COMPARISON:  None. FINDINGS: Brain: No acute territorial infarction, hemorrhage or intracranial mass. Mild atrophy. Nonenlarged ventricles. Vascular: No hyperdense vessels.  No unexpected calcification Skull: No fracture Sinuses/Orbits: Postsurgical changes of the maxillary and ethmoid sinuses with mucosal thickening present. No acute orbital abnormality. 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 IMPRESSION: 1. No CT evidence for acute intracranial abnormality. 2. Mild atrophy. Electronically Signed   By: Donavan Foil M.D.   On: 03/25/2018 21:12   Mr Brain Wo Contrast  Result Date: 03/26/2018 CLINICAL DATA:  Nausea, vomiting and dizziness.  EXAM: MRI HEAD WITHOUT CONTRAST TECHNIQUE: Multiplanar, multiecho pulse sequences of the brain and surrounding structures were obtained without intravenous contrast. COMPARISON:  Head CT 03/25/2018 FINDINGS: BRAIN: The midline structures are normal. There is a punctate focus of abnormal diffusion restriction within the superior left parietal white matter, measuring approximately 5 mm. No associated hemorrhage or mass effect. No mass lesion, hydrocephalus, dural abnormality or extra-axial collection.  The brain parenchymal signal is normal for the patient's age. No age-advanced or lobar predominant atrophy. No chronic microhemorrhage or superficial siderosis. VASCULAR: Major intracranial arterial and venous sinus flow voids are preserved. SKULL AND UPPER CERVICAL SPINE: The visualized skull base, calvarium, upper cervical spine and extracranial soft tissues are normal. SINUSES/ORBITS: No fluid levels or advanced mucosal thickening. No mastoid or middle ear effusion. Normal orbits. IMPRESSION: Punctate focus of acute ischemia within the left parietal white matter, without hemorrhage or mass effect. Otherwise normal MRI of the brain. Electronically Signed   By: Ulyses Jarred M.D.   On: 03/26/2018 04:19    EKG: (Independently reviewed) sinus rhythm with ventricular rate 87 bpm, QTC 452 ms, normal R wave rotation, no ischemic changes  Assessment/Plan Principal Problem:   Acute CVA (cerebrovascular accident)  -Patient presents with dizziness and vertigo symptoms worse with movement and upright position with MRI confirming left parietal ischemic CVA without hemorrhage or mass-effect -CTA head and neck -Echocardiogram -Permissive hypertension: treat SBP >/= 220 w/IV hydralazine prn -HgbA1c/FLP -Atorvastatin 80 mg -PT/OT/SLP evaluation -PT vestibular evaluation -Valium prn vertigo symptoms with meclizine prn for symptoms not relieved with Valium -Frequent neurological checks -Mobilize with  assistance  Active Problems:   Hyperlipidemia -Home atorvastatin increased from 40 mg to 80 mg in context of acute CVA    Hypertension -Allow for permissive hypertension -Home telmisartan being held initially -Neurology recommends BP normalized to 140/90 or below upon discharge    Acute hyperglycemia -Likely secondary to acute stressor from stroke and recurrent vertigo symptoms with nausea vomiting, dehydration -Follow-up on HgbA1c in the event this is precursor to diabetes diagnosis -Noted with glycosuria and small amount of ketones in urine    Leukocytosis -Likely secondary to dehydration and recurrent nausea and vomiting in the setting of vertigo symptoms -For completeness of evaluation obtain urinalysis with culture -No other infectious signs noted -Normal saline IV fluid at 75/hr and can discontinue once tolerates oral intake without nausea and vomiting    Obesity (BMI 30.0-34.9)/OSA on CPAP -Continue nocturnal CPAP    GERD (gastroesophageal reflux disease) -Continue PPI    **Additional lab, imaging and/or diagnostic evaluation at discretion of supervising physician  DVT prophylaxis: Lovenox Code Status: DNR Family Communication: Wife Disposition Plan: Home Consults called: Neurology/[place]    Samella Parr ANP-BC [facility] Pager [phone]   If 7PM-7AM, please contact night-coverage [email or web address] Password [facility]  03/26/2018, 8:19 AM

## 2018-03-27 DIAGNOSIS — K219 Gastro-esophageal reflux disease without esophagitis: Secondary | ICD-10-CM

## 2018-03-27 DIAGNOSIS — Z9989 Dependence on other enabling machines and devices: Secondary | ICD-10-CM

## 2018-03-27 DIAGNOSIS — E785 Hyperlipidemia, unspecified: Secondary | ICD-10-CM

## 2018-03-27 DIAGNOSIS — E669 Obesity, unspecified: Secondary | ICD-10-CM

## 2018-03-27 DIAGNOSIS — G4733 Obstructive sleep apnea (adult) (pediatric): Secondary | ICD-10-CM

## 2018-03-27 DIAGNOSIS — I159 Secondary hypertension, unspecified: Secondary | ICD-10-CM

## 2018-03-27 LAB — LIPID PANEL
CHOL/HDL RATIO: 2.9 ratio
Cholesterol: 96 mg/dL (ref 0–200)
HDL: 33 mg/dL — AB (ref >40)
LDL Cholesterol: 45 mg/dL (ref 0–99)
TRIGLYCERIDES: 92 mg/dL (ref <150)
VLDL: 18 mg/dL (ref 0–40)

## 2018-03-27 LAB — HEMOGLOBIN A1C
HEMOGLOBIN A1C: 6.1 % — AB (ref 4.8–5.6)
MEAN PLASMA GLUCOSE: 128.37 mg/dL

## 2018-03-27 LAB — URINE CULTURE

## 2018-03-27 MED ORDER — CLOPIDOGREL BISULFATE 75 MG PO TABS: 75.0000 mg | ORAL_TABLET | Freq: Every day | ORAL | 0 refills | Status: AC

## 2018-03-27 NOTE — Progress Notes (Signed)
Patient and wife given and reviewed discharge AVS. Awaits transport to discharge home in wife's car. Patient has received bed side commode and front wheel walker.

## 2018-03-27 NOTE — Care Management Note (Signed)
Case Management Note  Patient Details  Name: Chris [name] MRN: [id number] Date of Birth: [birth year]  Subjective/Objective:                    Action/Plan: Pt discharging home with self care. CM consulted for outpatient therapy. CM met with the patient and his spouse and provided choice. They selected [facility]. Orders in Epic and information on the AVS.  Pt with orders for walker and 3 in 1. Jermaine with [facility] DME notified and will deliver to the room. Wife to provide transportation home and supervision at home.   Expected Discharge Date:  03/27/18               Expected Discharge Plan:  OP Rehab  In-House Referral:     Discharge planning Services  CM Consult  Post Acute Care Choice:    Choice offered to:  Patient, Spouse  DME Arranged:  3-N-1, Walker rolling DME Agency:  [place]:    [place]:     Status of Service:  Completed, signed off  If discussed at [place] of Stay Meetings, dates discussed:    Additional Comments:  Pollie Friar, RN 03/27/2018, 4:19 PM

## 2018-03-27 NOTE — Evaluation (Signed)
Physical Therapy Evaluation Patient Details Name: Chris [name] MRN: [id number] DOB: 1946/06/14 Today's Date: 03/27/2018   History of Present Illness  Chris [name] is a 72 y.o. male who has a past medical history of hypertension, hyperlipidemia, glucose intolerance, obstructive sleep apnea compliant with CPAP, history of TIA, who woke up on the morning of 03/25/2018 at around 8 AM with dizziness.  MRI revealed left parietal infarct.   Clinical Impression  Pt admitted with above diagnosis. Pt currently with functional limitations due to the deficits listed below (see PT Problem List). Pt was able to ambulate with RW in hallway using compensation techniques with min guard to min assist.  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  Pt will benefit from skilled PT to increase their independence and safety with mobility to allow discharge to the venue listed below.      Follow Up Recommendations Outpatient PT;Supervision/Assistance - 24 hour(for vestibular rehab)    Equipment Recommendations  Rolling walker with 5" wheels    Recommendations for Other Services       Precautions / Restrictions Precautions Precautions: Fall Restrictions Weight Bearing Restrictions: No      Mobility  Bed Mobility Overal bed mobility: Independent             General bed mobility comments: incr time due to dizziness.  Pt cautious.   Transfers Overall transfer level: Needs assistance Equipment used: Rolling walker (2 wheeled) Transfers: Sit to/from Stand Sit to Stand: Min guard         General transfer comment: Assisted pt with min guard assist for stability as dizziness causing decr balance.  Teaching pt compensation strategies but pt needs cuing and practice.    Ambulation/Gait Ambulation/Gait assistance: Min assist Ambulation Distance (Feet): 110 Feet Assistive device: Rolling walker (2 wheeled) Gait Pattern/deviations: Decreased stride length;Step-through  pattern;Drifts right/left;Staggering right;Staggering left   Gait velocity interpretation: Below normal speed for age/gender General Gait Details: Pt looking at targets to keep dizziness less with ambulation.  Did well with using compensation overall but needs cues.  Pt self correcting when he did lose balance but still min assist for stability overall as he was just unsure of steps and needing cues for sequencing steps and RW.    Stairs            Wheelchair Mobility    Modified Rankin (Stroke Patients Only)       Balance Overall balance assessment: Needs assistance Sitting-balance support: No upper extremity supported;Feet supported Sitting balance-Leahy Scale: Good     Standing balance support: Bilateral upper extremity supported;During functional activity Standing balance-Leahy Scale: Poor Standing balance comment: relies on RW for balance.  Dizziness causing instability at times                             Pertinent Vitals/Pain Pain Assessment: No/denies pain    Home Living Family/patient expects to be discharged to:: Private residence Living Arrangements: Spouse/significant other Available Help at Discharge: Family;Available 24 hours/day Type of Home: House Home Access: Stairs to enter Entrance Stairs-Rails: None Entrance Stairs-Number of Steps: 3 Home Layout: Two level;Able to live on main level with bedroom/bathroom Home Equipment: (CPAP)      Prior Function Level of Independence: Independent               Hand Dominance        Extremity/Trunk Assessment   Upper Extremity Assessment Upper Extremity Assessment: Defer  to OT evaluation    Lower Extremity Assessment Lower Extremity Assessment: Overall WFL for tasks assessed    Cervical / Trunk Assessment Cervical / Trunk Assessment: Normal  Communication   Communication: No difficulties  Cognition Arousal/Alertness: Awake/alert Behavior During Therapy: WFL for tasks  assessed/performed Overall Cognitive Status: Within Functional Limits for tasks assessed                                        General Comments General comments (skin integrity, edema, etc.): Pt positive head thrust left suggesting left hypofunction.      Exercises Other Exercises Other Exercises: initiated x1 exercises - pt understands to perform 2x 5 x day.    Assessment/Plan    PT Assessment Patient needs continued PT services  PT Problem List Decreased activity tolerance;Decreased balance;Decreased mobility;Decreased knowledge of use of DME;Decreased safety awareness;Decreased knowledge of precautions       PT Treatment Interventions DME instruction;Gait training;Functional mobility training;Therapeutic activities;Therapeutic exercise;Balance training;Patient/family education;Stair training    PT Goals (Current goals can be found in the Care Plan section)  Acute Rehab PT Goals Patient Stated Goal: to get better PT Goal Formulation: With patient Time For Goal Achievement: 04/10/18 Potential to Achieve Goals: Good    Frequency Min 3X/week   Barriers to discharge        Co-evaluation               AM-PAC PT "6 Clicks" Daily Activity  Outcome Measure Difficulty turning over in bed (including adjusting bedclothes, sheets and blankets)?: None Difficulty moving from lying on back to sitting on the side of the bed? : None Difficulty sitting down on and standing up from a chair with arms (e.g., wheelchair, bedside commode, etc,.)?: A Little Help needed moving to and from a bed to chair (including a wheelchair)?: A Little Help needed walking in hospital room?: A Little Help needed climbing 3-5 steps with a railing? : A Lot 6 Click Score: 19    End of Session Equipment Utilized During Treatment: Gait belt Activity Tolerance: Patient limited by fatigue(limited by dizziness) Patient left: in chair;with call bell/phone within reach Nurse Communication:  Mobility status PT Visit Diagnosis: Muscle weakness (generalized) (M62.81);Unsteadiness on feet (R26.81)    Time: 4008-6761 PT Time Calculation (min) (ACUTE ONLY): 35 min   Charges:   PT Evaluation $PT Eval Moderate Complexity: 1 Mod PT Treatments $Gait Training: 8-22 mins   PT G Codes:        Cortina Vultaggio,PT Acute Rehabilitation [phone] (pager)   Denice Paradise 03/27/2018, 11:08 AM

## 2018-03-27 NOTE — Progress Notes (Signed)
Occupational Therapy Evaluation Patient Details Name: Chris [name] MRN: [id number] DOB: [birth year] Today's Date: 03/27/2018    History of Present Illness Chris [name] is a 72 y.o. male who has a past medical history of hypertension, hyperlipidemia, glucose intolerance, obstructive sleep apnea compliant with CPAP, history of TIA, who woke up on the morning of 03/25/2018 at around 8 AM with dizziness.  MRI revealed left parietal infarct. Per neurology, small brainstem infarct not visualized on MRI   Clinical Impression   PTA, pt lived with wife, is retired and was independent with all ADL and mobility, including driving. Pt complains of horizontal diplopia in R/L lateral gaze which is significantly reduced with use of partial occlusion to improve functional vision. Recommend pt have S with all mobility @ RW level and follow up with OT at the neuro outpt center. Will follow acutely to facilitate safe DC home and maximize functional level of independence.     Follow Up Recommendations  Outpatient OT;Supervision - Intermittent(initially with all mobility)    Equipment Recommendations  3 in 1 bedside commode(to use as shower seat)    Recommendations for Other Services       Precautions / Restrictions Precautions Precautions: Fall Restrictions Weight Bearing Restrictions: No      Mobility Bed Mobility Overal bed mobility: Independent             General bed mobility comments: incr time due to dizziness.  Pt cautious.   Transfers Overall transfer level: Needs assistance Equipment used: Rolling walker (2 wheeled) Transfers: Sit to/from Stand Sit to Stand: Min guard         General transfer comment: Assisted pt with min guard assist for stability as dizziness causing decr balance.  Teaching pt compensation strategies but pt needs cuing and practice.      Balance Overall balance assessment: Needs assistance Sitting-balance support: No upper extremity supported;Feet  supported Sitting balance-Leahy Scale: Good     Standing balance support: Bilateral upper extremity supported;During functional activity Standing balance-Leahy Scale: Poor Standing balance comment: relies on RW for balance.  Cues to use target as focal point during mobility                           ADL either performed or assessed with clinical judgement   ADL Overall ADL's : Needs assistance/impaired     Grooming: Standing;Min guard   Upper Body Bathing: Set up;Supervision/ safety   Lower Body Bathing: Minimal assistance;Sit to/from stand   Upper Body Dressing : Set up;Sitting   Lower Body Dressing: Minimal assistance;Sit to/from stand   Toilet Transfer: Minimal assistance;RW;Ambulation;Comfort height toilet   Toileting- Clothing Manipulation and Hygiene: Supervision/safety;Sit to/from stand       Functional mobility during ADLs: Minimal assistance;Rolling walker;Cueing for safety General ADL Comments: vc for hand placement with use of RW; began education on safety during ADL adn functional mobility with using sense of touch to increase safety; discussed using rails when going up/down stairs adn having wife with him when he gets in/out of shower; recommend for pt to sit when bathing due to unsteadiness     Vision Baseline Vision/History: Wears glasses Wears Glasses: At all times Patient Visual Report: Diplopia Vision Assessment?: Yes Eye Alignment: Within Functional Limits Ocular Range of Motion: Within Functional Limits Alignment/Gaze Preference: Within Defined Limits Tracking/Visual Pursuits: Decreased smoothness of horizontal tracking;Decreased smoothness of vertical tracking Saccades: Additional eye shifts occurred during testing Convergence: Within functional limits Diplopia Assessment: Objects  split side to side;Disappears with one eye closed;Other (comment)(lat R/L gaze) Depth Perception: Undershoots;Overshoots Additional Comments: Appears to be R  eye dominant; L nasal portion partially occluded. Pt reports this significantly improves his functional vision     Perception Perception Comments: spatial deficits due to vision deficits   Praxis Praxis Praxis tested?: Within functional limits    Pertinent Vitals/Pain Pain Assessment: Faces Faces Pain Scale: Hurts a little bit Pain Location: Headache Pain Descriptors / Indicators: Headache Pain Intervention(s): Limited activity within patient's tolerance     Hand Dominance Right   Extremity/Trunk Assessment Upper Extremity Assessment Upper Extremity Assessment: Overall WFL for tasks assessed   Lower Extremity Assessment Lower Extremity Assessment: Overall WFL for tasks assessed   Cervical / Trunk Assessment Cervical / Trunk Assessment: Normal   Communication Communication Communication: No difficulties   Cognition Arousal/Alertness: Awake/alert Behavior During Therapy: WFL for tasks assessed/performed Overall Cognitive Status: Within Functional Limits for tasks assessed                                     General Comments      Exercises    Shoulder Instructions      Home Living Family/patient expects to be discharged to:: Private residence Living Arrangements: Spouse/significant other Available Help at Discharge: Family;Available 24 hours/day Type of Home: House Home Access: Stairs to enter [employer] of Steps: 3 Entrance Stairs-Rails: None Home Layout: Two level;Able to live on main level with bedroom/bathroom Alternate Level Stairs-Number of Steps: flight   Bathroom Shower/Tub: Occupational psychologist: Standard Bathroom Accessibility: Yes How Accessible: Accessible via walker Home Equipment: None(CPAP)          Prior Functioning/Environment Level of Independence: Independent        Comments: drives; retired; plays [employer] instuments         OT Problem List: Impaired balance (sitting and/or standing);Impaired  vision/perception;Decreased knowledge of use of DME or AE      OT Treatment/Interventions: Self-care/ADL training;Neuromuscular education;DME and/or AE instruction;Therapeutic activities;Visual/perceptual remediation/compensation;Patient/family education;Balance training    OT Goals(Current goals can be found in the care plan section) Acute Rehab OT Goals Patient Stated Goal: to get better OT Goal Formulation: With patient Time For Goal Achievement: 04/10/18 Potential to Achieve Goals: Good  OT Frequency: Min 3X/week   Barriers to D/C:            Co-evaluation              AM-PAC PT "6 Clicks" Daily Activity     Outcome Measure Help from another person eating meals?: None Help from another person taking care of personal grooming?: A Little Help from another person toileting, which includes using toliet, bedpan, or urinal?: A Little Help from another person bathing (including washing, rinsing, drying)?: A Little Help from another person to put on and taking off regular upper body clothing?: None Help from another person to put on and taking off regular lower body clothing?: A Little 6 Click Score: 20   End of Session Equipment Utilized During Treatment: Rolling walker Nurse Communication: Mobility status;Other (comment)(partial occlusion)  Activity Tolerance: Patient tolerated treatment well Patient left: Other (comment)(in bathroom - nsg made aware)  OT Visit Diagnosis: Unsteadiness on feet (R26.81);Muscle weakness (generalized) (M62.81);Low vision, both eyes (H54.2)                Time: 7673-4193 OT Time Calculation (min): 28 min Charges:  OT General Charges $OT Visit: 1 Visit OT Evaluation $OT Eval Moderate Complexity: 1 Mod OT Treatments $Self Care/Home Management : 8-22 mins G-Codes:     [facility], OT/L  [phone] 03/27/2018  Stellar Gensel,HILLARY 03/27/2018, 1:07 PM

## 2018-03-27 NOTE — Progress Notes (Signed)
STROKE TEAM PROGRESS NOTE      INTERVAL HISTORY  Patient states he  He states his dizziness, imbalance and blurred vision which appears to be   back to baseline.he still has horizontal diplopia on extreme lateral gaze more on the right and left side. Echocardiogram was unremarkable.lipid profile and hemoglobin A1c were also normal  CBC:  CBC Latest Ref Rng & Units 03/25/2018 03/25/2018  WBC 4.0 - 10.5 K/uL - 16.2(H)  Hemoglobin 13.0 - 17.0 g/dL 15.6 15.3  Hematocrit 39.0 - 52.0 % 46.0 44.5  Platelets 150 - 400 K/uL - 261    Comprehensive Metabolic Panel:   CMP Latest Ref Rng & Units 03/25/2018 03/25/2018  Glucose 65 - 99 mg/dL 171(H) 171(H)  BUN 6 - 20 mg/dL 11 10  Creatinine 0.61 - 1.24 mg/dL 1.00 1.14  Sodium 135 - 145 mmol/L 139 137  Potassium 3.5 - 5.1 mmol/L 3.9 3.7  Chloride 101 - 111 mmol/L 103 102  CO2 22 - 32 mmol/L - 22  Calcium 8.9 - 10.3 mg/dL - 9.2  Total Protein 6.5 - 8.1 g/dL - 7.4  Total Bilirubin 0.3 - 1.2 mg/dL - 1.3(H)  Alkaline Phos 38 - 126 U/L - 92  AST 15 - 41 U/L - 25  ALT 17 - 63 U/L - 35   Lipid Panel:     Component Value Date/Time   CHOL 96 03/27/2018 0527   TRIG 92 03/27/2018 0527   HDL 33 (L) 03/27/2018 0527   CHOLHDL 2.9 03/27/2018 0527   VLDL 18 03/27/2018 0527   LDLCALC 45 03/27/2018 0527   HgbA1c:  Lab Results  Component Value Date   HGBA1C 6.1 (H) 03/27/2018   Urine Drug Screen: No results found for: LABOPIA, COCAINSCRNUR, LABBENZ, AMPHETMU, THCU, LABBARB  Alcohol Level No results found for: [facility]  Vitals:   03/27/18 0435 03/27/18 0659 03/27/18 0800 03/27/18 1200  BP: 138/86 (!) 148/82 139/80 132/79  Pulse: 63  64 68  Resp:   13 16  Temp: 98.3 F (36.8 C)  98.4 F (36.9 C) 98.2 F (36.8 C)  TempSrc: Oral  Oral Oral  SpO2: 97%  95% 96%  Weight:      Height:       Pleasant elderly Caucasian male not in distress. Sitting up comfortably in bed. . Afebrile. Head is nontraumatic. Neck is supple without bruit.    Cardiac exam no murmur or  gallop. Lungs are clear to auscultation. Distal pulses are well felt. Neurological Exam ;  Awake  Alert oriented x 3. Normal speech and language.eye movements full without nystagmus.fundi were not visualized. Vision acuity and fields appear normal. He has subjective horizontal diplopia which is binocular on extreme lateral gaze right more than left. Head shaking produces subjective dizziness without objective nystagmus noted.Hearing is normal. Palatal movements are normal. Face symmetric. Tongue midline. Normal strength, tone, reflexes and coordination. Normal sensation. Gait deferred.  ASSESSMENT/PLAN [name] is a 72 y.o. male with history of HTN, HLD, glucose intolerance, OSA on CPAP, hx TIA presenting with dizziness upon awakening and had difficulty walking. .   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 Atrophy.   MRI  Punctate L parietal WM infarct  CTA head, CTA neck no significant stenosis, occlusion or dissection  2D Echo  unremarkable  LDL 45 mg %  HgbA1c 6.1  Lovenox 40 mg sq daily for VTE prophylaxis Fall precautions Diet regular Room service appropriate? Yes; Fluid consistency: Thin  aspirin 81 mg daily prior to admission, now on aspirin 325 mg daily. Given stroke on aspirin, recommend change to plavix. Orders written  Therapy recommendations: outpatient PT  Disposition:  home  Hypertension  Stable . Permissive hypertension (OK if < 220/120) but gradually normalize in 5-7 days . Long-term BP goal normotensive  Hyperlipidemia  Home meds:  lipitor 40  LDL  45 mg% goal < 70  Now on lipitor 80  Continue statin at discharge  Glucose Intolerance  HgbA1c pending, goal < 7.0  Other Stroke Risk Factors  Advanced age  Obesity, Body mass index is 30.43 kg/m.  Hx TIA  Obstructive sleep apnea, on CPAP at  home  Other Active Problems  Leukocytosis - Likely secondary to dehydration and recurrent nausea and vomiting in the setting of vertigo symptoms  GERD  Hospital day # 1 I have personally examined this patient, reviewed notes, independently viewed imaging studies, participated in medical decision making and plan of care.ROS completed by me personally and pertinent positives fully documented  I have made any additions or clarifications directly to the above note. Agree with note above. He presented with sudden onset of vertigo and diplopia or dizziness likely due to a small brainstem infarct not visualized on MRI. Etiology likely small vessel disease. MRI shows incidental tiny punctate left parietal subcortical lacunar infarct. Recommend Plavix for stroke prevention as patient states that aspirin causes stomach upset for him.  Stroke team will sign off. Follow-up as an outpatient in stroke clinic in 6 weeks. Antony Contras, MD Medical Director [place] Pager: [phone] 03/27/2018 3:46 PM        To contact Stroke Continuity provider, please refer to [email or web address] After hours, contact General Neurology

## 2018-03-27 NOTE — Progress Notes (Signed)
CPAP machine not available at this time.  PT will be placed on CPAP when one becomes available.

## 2018-03-27 NOTE — Progress Notes (Signed)
OT Treatment note  Pt seen for additional visit. Pt tolerating partial occlusion over L eye. completed education regarding partial occlusioin and home safety with wife. Pt/wife verbalized understanding. Continue to recommend follow up with neurooutpt OT.     03/27/18 1500  OT Visit Information  Last OT Received On 03/27/18  Assistance Needed +1  History of Present Illness Chris [name] is a 72 y.o. male who has a past medical history of hypertension, hyperlipidemia, glucose intolerance, obstructive sleep apnea compliant with CPAP, history of TIA, who woke up on the morning of 03/25/2018 at around 8 AM with dizziness.  MRI revealed left parietal infarct. Per neurology, small brainstem infarct not visualized on MRI  Precautions  Precautions Fall  Pain Assessment  Pain Assessment No/denies pain  Cognition  Arousal/Alertness Awake/alert  Behavior During Therapy WFL for tasks assessed/performed  Overall Cognitive Status Within Functional Limits for tasks assessed  General Comments  General comments (skin integrity, edema, etc.) Completed education with wife regarding partial occlusion to improve functional vision and home safety. Handout provided and reviewed and questions answered. Educated on home safety and reducing risk of falls. Pt/wife verbalized understanding.   OT - End of Session  Activity Tolerance Patient tolerated treatment well  Patient left in bed;with family/visitor present  Nurse Communication Mobility status  OT Assessment/Plan  OT Plan Discharge plan remains appropriate  OT Visit Diagnosis Unsteadiness on feet (R26.81);Muscle weakness (generalized) (M62.81);Low vision, both eyes (H54.2)  OT Frequency (ACUTE ONLY) Min 3X/week  Follow Up Recommendations Outpatient OT;Supervision - Intermittent  OT Equipment 3 in 1 bedside commode  AM-PAC OT "6 Clicks" Daily Activity Outcome Measure  Help from another person eating meals? 4  Help from another person taking care of personal  grooming? 3  Help from another person toileting, which includes using toliet, bedpan, or urinal? 3  Help from another person bathing (including washing, rinsing, drying)? 3  Help from another person to put on and taking off regular upper body clothing? 4  Help from another person to put on and taking off regular lower body clothing? 3  6 Click Score 20  ADL G Code Conversion CJ  OT Goal Progression  Progress towards OT goals Progressing toward goals  Acute Rehab OT Goals  Patient Stated Goal to get better  OT Goal Formulation With patient  Time For Goal Achievement 04/10/18  Potential to Achieve Goals Good  ADL Goals  Pt Will Perform Tub/Shower Transfer ambulating;3 in 1;rolling walker;with caregiver independent in assisting;with supervision  Additional ADL Goal #1 Pt will independently verbalize use adn progression of partial occlusion to improve functional vision  Additional ADL Goal #2 Pt/wife will independently identify 3 strategies to reduce risk of falls  OT Time Calculation  OT Start Time (ACUTE ONLY) 1417  OT Stop Time (ACUTE ONLY) 1432  OT Time Calculation (min) 15 min  OT General Charges  $OT Visit 1 Visit  OT Treatments  $Therapeutic Activity 8-22 mins  [facility], OT/L  [phone] 03/27/2018

## 2018-03-27 NOTE — Discharge Summary (Addendum)
Physician Discharge Summary  Chris [name] ZOX:[id number] DOB: 30-Jul-1946 DOA: 03/25/2018  PCP: Marton Redwood, MD  Admit date: 03/25/2018 Discharge date: 03/27/2018  Admitted From: Home Disposition: Home   Recommendations for Outpatient Follow-up:  1. Follow up with PCP in 1-2 weeks. 2. Follow up with neurology in 6 weeks.   Home Health: None Equipment/Devices: None Discharge Condition: Stable CODE STATUS: Full Diet recommendation: Heart healthy  Brief/Interim Summary: Chris [name] is a 72 y.o. male with medical history significant for TIA, hypertension, dyslipidemia, glucose intolerance, sleep apnea on CPAP.  Patient awakened on the morning of 4/3 with dizziness and vertigo symptoms.  Symptoms persisted.  PCP was notified.  Zofran was ordered for nausea and meclizine was ordered for the vertigo symptoms. Symptoms did not improve and worsened with ambulation.  At least 10-20 episodes of nausea with vomiting.  No other neurological symptoms.  Patient presented to the ER for further evaluation.  He was treated symptomatically for vertigo with IV fluids and meclizine as well as Valium but symptoms persisted.  CT the head was unremarkable.  MRI of the brain demonstrated punctate area of acute ischemia left parietal lobe without hemorrhage or mass-effect.  Neurology was consulted and recommended admission for CVA evaluation. Symptoms are actually thought to be due to brainstem stroke not seen on imaging. Work up was negative, and the patient was stable for discharge to home per therapy recommendations. He will continue plavix and follow up with outpatient PT and neurology in 6 weeks.   Discharge Diagnoses:  Principal Problem:   Acute CVA (cerebrovascular accident) ([place]) Active Problems:   Obesity (BMI 30.0-34.9)   Hyperlipidemia   OSA on CPAP   Hypertension   GERD (gastroesophageal reflux disease)   Leukocytosis   Acute hyperglycemia  Acute CVA: Punctate left parietal infarct which is  incidental to the symptomatic brainstem infarct likely to be causing dizziness which was not seen on MRI.  - CTA head/neck no stenosis. 2D echo unremarkable. LDL 45, A1c 6.1%. Previously on aspirin 81mg  > recommended to switch to plavix per neurology. Will continue w/outpatient PT and symptomatic medication management.   Hyperlipidemia -Home atorvastatin increased from 40 mg to 80 mg in context of acute CVA  Hypertension - Restart home medications  Obesity (BMI 30.0-34.9)/OSA on CPAP - Continue nocturnal CPAP  GERD (gastroesophageal reflux disease) -Continue PPI  Mild aortic insufficiency: Seen on echo. No murmur on exam and no symptoms. Consider repeat echo if suspecting symptoms.   Discharge Instructions Discharge Instructions    Ambulatory referral to Neurology   Complete by:  As directed    Follow up with stroke clinic NP (Jessica Vanschaick or Cecille Rubin, if both not available, consider Dr. Antony Contras, Dr. Bess Harvest, or Dr. Sarina Ill) at [facility] in about 4 weeks.   Ambulatory referral to Occupational Therapy   Complete by:  As directed    Ambulatory referral to Physical Therapy   Complete by:  As directed    Diet - low sodium heart healthy   Complete by:  As directed    Discharge instructions   Complete by:  As directed    You were admitted with vertigo due to a small stroke which was not seen on MRI. You did also have an incidental finding of a "silent" stroke as well. Work up for secondary causes has been negative, so you are stable for discharge with the recommendation to start plavix (continue protonix), continue zofran as needed for nausea (though this symptom  should slowly improve with time), go to outpatient therapy, and follow up with neurology in about 6 weeks. If your symptoms worsen or you develop new focal neurological deficits (numbness, weakness, slurred speech, etc.) sek medical attention right away.   Increase activity  slowly   Complete by:  As directed      Allergies as of 03/27/2018      Reactions   Morphine And Related Nausea And Vomiting      Medication List    STOP taking these medications   aspirin EC 81 MG tablet     TAKE these medications   atorvastatin 40 MG tablet Commonly known as:  LIPITOR Take 40 mg by mouth daily.   clopidogrel 75 MG tablet Commonly known as:  PLAVIX Take 1 tablet (75 mg total) by mouth daily.   fluticasone 50 MCG/ACT nasal spray Commonly known as:  FLONASE Place 1 spray into both nostrils daily as needed for allergies.   meclizine 25 MG tablet Commonly known as:  ANTIVERT Take 25 mg by mouth 3 (three) times daily as needed for dizziness or nausea.   ondansetron 4 MG tablet Commonly known as:  ZOFRAN Take 4 mg by mouth 3 (three) times daily as needed for nausea.   pantoprazole 40 MG tablet Commonly known as:  PROTONIX Take 40 mg by mouth daily.   telmisartan 80 MG tablet Commonly known as:  MICARDIS Take 80 mg by mouth daily.      Follow-up Information    [facility] Follow up in 4 week(s).   Specialty:  Neurology Why:  stroke clinic. office will call with appt date and time Contact information: [place] [phone]       Marton Redwood, MD Follow up.   Specialty:  Internal Medicine Contact information: [place] [address] [address] Follow up.   Specialty:  Rehabilitation Why:  They will contact you for the first appointment Contact information: [place] [address] [phone]         Allergies  Allergen Reactions  . Morphine And Related Nausea And Vomiting    Consultations:  Neurology  Procedures/Studies: Ct Angio Head W Or Wo Contrast  Result Date: 03/26/2018 CLINICAL DATA:  Ataxia, and severe persistent  vertigo, with evidence for acute subcortical stroke LEFT posterior frontal parietal lobe. EXAM: CT ANGIOGRAPHY HEAD AND NECK TECHNIQUE: Multidetector CT imaging of the head and neck was performed using the standard protocol during bolus administration of intravenous contrast. Multiplanar CT image reconstructions and MIPs were obtained to evaluate the vascular anatomy. Carotid stenosis measurements (when applicable) are obtained utilizing NASCET criteria, using the distal internal carotid diameter as the denominator. CONTRAST:  62mL ISOVUE-370 IOPAMIDOL (ISOVUE-370) INJECTION 76% COMPARISON:  MR brain 03/26/2018. CT head 03/25/2018. FINDINGS: CTA NECK Aortic arch: Standard branching. Imaged portion shows no evidence of aneurysm or dissection. No significant stenosis of the major arch vessel origins. Right carotid system: Widely patent. No evidence of dissection, stenosis (50% or greater) or occlusion. Left carotid system: Widely patent. No evidence of dissection, stenosis (50% or greater) or occlusion. Vertebral arteries: Codominant. No evidence of dissection, stenosis (50% or greater) or occlusion. Nonvascular soft tissues: No acute findings. Spondylosis. Lung apices clear. CTA HEAD Anterior circulation: Widely patent. No significant stenosis, proximal occlusion, aneurysm, or vascular malformation. Posterior circulation: Widely patent. No significant stenosis, proximal occlusion, aneurysm, or vascular  malformation. Venous sinuses: As permitted by contrast timing, patent. Anatomic variants: None of significance. Delayed phase:   No abnormal intracranial enhancement. Review of the MIP images confirms the above findings. IMPRESSION: No intracranial or extracranial stenosis, occlusion, or dissection. Electronically Signed   By: Staci Righter M.D.   On: 03/26/2018 10:57   Ct Head Wo Contrast  Result Date: 03/25/2018 CLINICAL DATA:  Extreme dizziness with nausea and vomiting EXAM: CT HEAD WITHOUT CONTRAST TECHNIQUE:  Contiguous axial images were obtained from the base of the skull through the vertex without intravenous contrast. COMPARISON:  None. FINDINGS: Brain: No acute territorial infarction, hemorrhage or intracranial mass. Mild atrophy. Nonenlarged ventricles. Vascular: No hyperdense vessels.  No unexpected calcification Skull: No fracture Sinuses/Orbits: Postsurgical changes of the maxillary and ethmoid sinuses with mucosal thickening present. No acute orbital abnormality. 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 IMPRESSION: 1. No CT evidence for acute intracranial abnormality. 2. Mild atrophy. Electronically Signed   By: Donavan Foil M.D.   On: 03/25/2018 21:12   Ct Angio Neck W Or Wo Contrast  Result Date: 03/26/2018 CLINICAL DATA:  Ataxia, and severe persistent vertigo, with evidence for acute subcortical stroke LEFT posterior frontal parietal lobe. EXAM: CT ANGIOGRAPHY HEAD AND NECK TECHNIQUE: Multidetector CT imaging of the head and neck was performed using the standard protocol during bolus administration of intravenous contrast. Multiplanar CT image reconstructions and MIPs were obtained to evaluate the vascular anatomy. Carotid stenosis measurements (when applicable) are obtained utilizing NASCET criteria, using the distal internal carotid diameter as the denominator. CONTRAST:  62mL ISOVUE-370 IOPAMIDOL (ISOVUE-370) INJECTION 76% COMPARISON:  MR brain 03/26/2018. CT head 03/25/2018. FINDINGS: CTA NECK Aortic arch: Standard branching. Imaged portion shows no evidence of aneurysm or dissection. No significant stenosis of the major arch vessel origins. Right carotid system: Widely patent. No evidence of dissection, stenosis (50% or greater) or occlusion. Left carotid system: Widely patent. No evidence of dissection, stenosis (50% or greater) or occlusion. Vertebral arteries: Codominant. No evidence of dissection, stenosis (50% or greater) or occlusion. Nonvascular soft tissues: No acute findings.  Spondylosis. Lung apices clear. CTA HEAD Anterior circulation: Widely patent. No significant stenosis, proximal occlusion, aneurysm, or vascular malformation. Posterior circulation: Widely patent. No significant stenosis, proximal occlusion, aneurysm, or vascular malformation. Venous sinuses: As permitted by contrast timing, patent. Anatomic variants: None of significance. Delayed phase:   No abnormal intracranial enhancement. Review of the MIP images confirms the above findings. IMPRESSION: No intracranial or extracranial stenosis, occlusion, or dissection. Electronically Signed   By: Staci Righter M.D.   On: 03/26/2018 10:57   Mr Brain Wo Contrast  Result Date: 03/26/2018 CLINICAL DATA:  Nausea, vomiting and dizziness. EXAM: MRI HEAD WITHOUT CONTRAST TECHNIQUE: Multiplanar, multiecho pulse sequences of the brain and surrounding structures were obtained without intravenous contrast. COMPARISON:  Head CT 03/25/2018 FINDINGS: BRAIN: The midline structures are normal. There is a punctate focus of abnormal diffusion restriction within the superior left parietal white matter, measuring approximately 5 mm. No associated hemorrhage or mass effect. No mass lesion, hydrocephalus, dural abnormality or extra-axial collection. The brain parenchymal signal is normal for the patient's age. No age-advanced or lobar predominant atrophy. No chronic microhemorrhage or superficial siderosis. VASCULAR: Major intracranial arterial and venous sinus flow voids are preserved. SKULL AND UPPER CERVICAL SPINE: The visualized skull base, calvarium, upper cervical spine and extracranial soft tissues are normal. SINUSES/ORBITS: No fluid levels or advanced mucosal thickening. No mastoid or middle ear effusion. Normal orbits. IMPRESSION: Punctate focus of acute  ischemia within the left parietal white matter, without hemorrhage or mass effect. Otherwise normal MRI of the brain. Electronically Signed   By: Ulyses Jarred M.D.   On: 03/26/2018  04:19    - Left ventricle: The cavity size was normal. Wall thickness was   increased in a pattern of mild LVH. Systolic function was normal.   The estimated ejection fraction was in the range of 55% to 60%.   Wall motion was normal; there were no regional wall motion   abnormalities. - Aortic valve: There was no stenosis. There was mild   regurgitation. - Mitral valve: There was trivial regurgitation. - Right ventricle: The cavity size was normal. Systolic function   was normal. - Tricuspid valve: Peak RV-RA gradient (S): 23 mm Hg. - Pulmonary arteries: PA peak pressure: 26 mm Hg (S). - Inferior vena cava: The vessel was normal in size. The   respirophasic diameter changes were in the normal range (>= 50%),   consistent with normal central venous pressure.  Impressions:  - Normal LV size with mild LV hypertrophy. EF 55-60%. Normal RV   size and systolic function. Mild aortic insufficiency.  Subjective: Feels better. Holding down food and able to ambulate.   Discharge Exam: Vitals:   03/27/18 1200 03/27/18 1700  BP: 132/79 136/74  Pulse: 68 67  Resp: 16 16  Temp: 98.2 F (36.8 C) 98.2 F (36.8 C)  SpO2: 96% 96%   General: Pt is alert, awake, not in acute distress Cardiovascular: RRR, S1/S2 +, no rubs, no gallops Respiratory: CTA bilaterally, no wheezing, no rhonchi Abdominal: Soft, NT, ND, bowel sounds + Extremities: No edema, no cyanosis Neuro: Alert, oriented, no focal deficits in strength or sensation, no dysmetria. Has diplopia and subjective dizziness on far lateral gaze without dysconjugate gaze or nystagmus.  Labs: BNP (last 3 results) No results for input(s): BNP in the last 8760 hours. Basic Metabolic Panel: Recent Labs  Lab 03/25/18 2039 03/25/18 2052  NA 137 139  K 3.7 3.9  CL 102 103  CO2 22  --   GLUCOSE 171* 171*  BUN 10 11  CREATININE 1.14 1.00  CALCIUM 9.2  --    Liver Function Tests: Recent Labs  Lab 03/25/18 2039  AST 25  ALT 35   ALKPHOS 92  BILITOT 1.3*  PROT 7.4  ALBUMIN 4.5   No results for input(s): LIPASE, AMYLASE in the last 168 hours. No results for input(s): AMMONIA in the last 168 hours. CBC: Recent Labs  Lab 03/25/18 2039 03/25/18 2052  WBC 16.2*  --   NEUTROABS 14.4*  --   HGB 15.3 15.6  HCT 44.5 46.0  MCV 89.0  --   PLT 261  --    Cardiac Enzymes: No results for input(s): CKTOTAL, CKMB, CKMBINDEX, TROPONINI in the last 168 hours. BNP: Invalid input(s): POCBNP CBG: Recent Labs  Lab 03/25/18 2309  GLUCAP 168*   D-Dimer No results for input(s): DDIMER in the last 72 hours. Hgb A1c Recent Labs    03/27/18 0527  HGBA1C 6.1*   Lipid Profile Recent Labs    03/27/18 0527  CHOL 96  HDL 33*  LDLCALC 45  TRIG 92  CHOLHDL 2.9   Thyroid function studies No results for input(s): TSH, T4TOTAL, T3FREE, THYROIDAB in the last 72 hours.  Invalid input(s): FREET3 Anemia work up No results for input(s): VITAMINB12, FOLATE, FERRITIN, TIBC, IRON, RETICCTPCT in the last 72 hours. Urinalysis    Component Value Date/Time   COLORURINE YELLOW  03/26/2018 0848   APPEARANCEUR HAZY (A) 03/26/2018 0848   LABSPEC 1.017 03/26/2018 0848   PHURINE 6.0 03/26/2018 0848   GLUCOSEU 50 (A) 03/26/2018 0848   HGBUR NEGATIVE 03/26/2018 0848   BILIRUBINUR NEGATIVE 03/26/2018 0848   KETONESUR 5 (A) 03/26/2018 0848   PROTEINUR NEGATIVE 03/26/2018 0848   NITRITE NEGATIVE 03/26/2018 0848   LEUKOCYTESUR NEGATIVE 03/26/2018 0848    Microbiology Recent Results (from the past 240 hour(s))  Culture, Urine     Status: Abnormal   Collection Time: 03/26/18  8:48 AM  Result Value Ref Range Status   Specimen Description URINE, CLEAN CATCH  Final   Special Requests NONE  Final   Culture (A)  Final    <10,000 COLONIES/mL INSIGNIFICANT GROWTH Performed at [facility], [address], [place], [place] [address]    Report Status 03/27/2018 FINAL  Final    Time coordinating discharge: Approximately 40  minutes  Vance Gather, MD  [facility] 03/29/2018, 4:39 PM Pager [phone]

## 2018-03-27 NOTE — Consult Note (Signed)
            [facility] CM Primary Care Navigator  03/27/2018  Chris [name] Dec 25, 1945 [id number]  Wentto seepatient in the room to identify possible discharge needs but he was alreadydischargedper staff report.  Per chart review, patient was discharged home today with Outpatient rehab per therapy recommendation [facility]).  Per MD note, patient was seen for persistent dizziness and vertigo symptoms with MRI of the brain result of acute ischemia left parietal lobe without hemorrhage or mass-effect.  Patient has a discharge instruction to follow-up with primary care provider within 1- 2 weeks.  Noted with order for EMMI Stroke calls to follow-up recovery at home already in place.   For additional questions please contact:  Edwena Felty A. [place] Rollyson, BSN, RN-BC [facility] PRIMARY CARE Navigator Cell: [phone]

## 2018-03-30 ENCOUNTER — Telehealth: Payer: Self-pay | Admitting: Internal Medicine

## 2018-03-30 NOTE — Telephone Encounter (Signed)
Pt is returning your call

## 2018-03-30 NOTE — Telephone Encounter (Signed)
Spoke with pts wife and she is aware that appt has been cancelled and knows about OV appt.

## 2018-03-30 NOTE — Telephone Encounter (Signed)
Spoke with pts wife, he was hospitalized last week, he has several TIA's. Pt has been placed on Plavix. He is scheduled for ECL on 04/23/18, EGD for GERD and screening colon. Wife calling to see if procedures now need to be delayed. Please advise.

## 2018-03-30 NOTE — Telephone Encounter (Signed)
Pts procedure appt cancelled. Pt scheduled to see Dr. Henrene Pastor 06/02/18@9am . Left message for pt to call back.

## 2018-03-30 NOTE — Telephone Encounter (Signed)
We should cancel his procedures. He should see ME in the office in 2 months for reassessment. If stable, we will discuss rescheduling his procedures at that time

## 2018-04-01 ENCOUNTER — Other Ambulatory Visit: Payer: Self-pay

## 2018-04-01 NOTE — Patient Outreach (Signed)
[facility]) Care Management  04/01/2018  Chris [name] 1946-05-05 [id number]  EMMI: stroke red alert Referral date: 04/01/18  Referral reason:scheduled follow up: no Day # 1  Telephone call to patient regarding emmi stroke red alert. Unable to reach patient. HIPAA compliant voice message left with call back phone number.   PLAN:  RNCM will attempt 2nd telephone call to patient within 4 business days.   Quinn Plowman RN,BSN,CCM [facility] Telephonic  [phone]

## 2018-04-02 DIAGNOSIS — H532 Diplopia: Secondary | ICD-10-CM | POA: Diagnosis not present

## 2018-04-02 DIAGNOSIS — Z6830 Body mass index (BMI) 30.0-30.9, adult: Secondary | ICD-10-CM | POA: Diagnosis not present

## 2018-04-02 DIAGNOSIS — I6350 Cerebral infarction due to unspecified occlusion or stenosis of unspecified cerebral artery: Secondary | ICD-10-CM | POA: Diagnosis not present

## 2018-04-02 DIAGNOSIS — R42 Dizziness and giddiness: Secondary | ICD-10-CM | POA: Diagnosis not present

## 2018-04-02 DIAGNOSIS — R270 Ataxia, unspecified: Secondary | ICD-10-CM | POA: Diagnosis not present

## 2018-04-03 ENCOUNTER — Other Ambulatory Visit: Payer: Self-pay

## 2018-04-03 NOTE — Patient Outreach (Signed)
[facility] Care Management  04/03/2018  Chris [name] Nov 07, 1946 [id number]  EMMI: stroke red alert Referral date: 04/01/18  Referral reason:scheduled follow up: no  Telephone call to patient regarding EMMI stroke red alert. HIPAA verified with patient.  Explained reason for call.  Patient states he saw his primary MD on yesterday.  Patient states he is scheduled to see his neurologist May 2019 and is scheduled to start out patient therapy on Wednesday 04/08/18.  Patient reports he has all of his medication, is able to afford them, and takes as directed.  Patient reports he has transportation to his appointment.  RNCM reviewed sign/ symptoms of stroke with patient. Advised to call 911 for stroke like symptoms.   RNCM advised patient to notify MD of any changes in condition prior to scheduled appointment. Patient requested RNCM provided contact name and number: [phone] or main office number [phone] and 24 hour nurse advise line [phone] by mail.   RNCM verified patient aware of 911 services for urgent/ emergent needs.  PLAN; RNCM will close patient due to patient being assessed and having no further needs.  RNCM will send patient [facility] care management brochure as discussed.   Quinn Plowman RN,BSN,CCM [facility] Telephonic  [phone]

## 2018-04-08 ENCOUNTER — Encounter: Payer: Self-pay | Admitting: Physical Therapy

## 2018-04-08 ENCOUNTER — Ambulatory Visit: Payer: Medicare HMO | Admitting: Occupational Therapy

## 2018-04-08 ENCOUNTER — Ambulatory Visit: Payer: Medicare HMO | Attending: Family Medicine | Admitting: Physical Therapy

## 2018-04-08 DIAGNOSIS — R41842 Visuospatial deficit: Secondary | ICD-10-CM | POA: Diagnosis not present

## 2018-04-08 DIAGNOSIS — R2681 Unsteadiness on feet: Secondary | ICD-10-CM

## 2018-04-08 DIAGNOSIS — R42 Dizziness and giddiness: Secondary | ICD-10-CM

## 2018-04-08 DIAGNOSIS — I69315 Cognitive social or emotional deficit following cerebral infarction: Secondary | ICD-10-CM

## 2018-04-08 DIAGNOSIS — R2689 Other abnormalities of gait and mobility: Secondary | ICD-10-CM

## 2018-04-08 NOTE — Therapy (Signed)
[facility] [address] [place], [place], [address] Phone: [phone]   Fax:  [phone]  Physical Therapy Evaluation  Patient Details  Name: Chris [name] MRN: [id number] Date of Birth: 10-07-1946 Referring Provider: Dr. Leonie Man   Encounter Date: 04/08/2018  PT End of Session - 04/08/18 1210    Visit Number  1    Number of Visits  18    Date for PT Re-Evaluation  07/07/18    Authorization Type  A[employer]na Medicare    PT Start Time  1021    PT Stop Time  1104    PT Time Calculation (min)  43 min    Activity Tolerance  Patient tolerated treatment well    Behavior During Therapy  [facility] for tasks assessed/performed       Past Medical History:  Diagnosis Date  . Acute CVA (cerebrovascular accident) ([place]) 03/25/2018   "double vision and balance issues" (03/26/2018)  . ADD (attention deficit disorder)   . Arthritis    "thumbs and fingers" (03/26/2018)  . Diverticulosis   . GERD (gastroesophageal reflux disease)   . Heart murmur    "when I was young; not noted since age 30" (03/26/2018)  . History of kidney stones   . Hyperlipidemia   . Hypertension   . IFG (impaired fasting glucose)   . Metabolic syndrome   . OSA on CPAP   . Pneumonia    "a time or 2" (03/26/2018)  . PONV (postoperative nausea and vomiting)   . Shingles ~ 2002  . TIA (transient ischemic attack) 2013    Past Surgical History:  Procedure Laterality Date  . CARPAL TUNNEL RELEASE Right   . CYSTOSCOPY W/ STONE MANIPULATION    . FINGER SURGERY Left    thumb surgery; "took bone out; put tendon in"  . TONSILLECTOMY AND ADENOIDECTOMY      There were no vitals filed for this visit.   Subjective Assessment - 04/08/18 1023    Subjective  Pt woke up with terrible spinning/vertigo, vomiting, couldn't stand up on 03/25/18.  Went to ED, with dx of CVA-has experienced double vision and balance issues.  Was independent prior to CVA.  No reported falls.  Reports  vertigo has improved-only brought on occasionally with quick head turns and with geting into bed.    Patient is accompained by:  Family member wife, Chris [name]    Patient Stated Goals  Pt's goals for PT are to get back to normal, do things around the house, and drive    Currently in Pain?  Yes    Pain Score  2     Pain Location  Head    Pain Descriptors / Indicators  Pressure;Dull    Pain Type  Acute pain    Pain Onset  1 to 4 weeks ago    Pain Frequency  Intermittent    Aggravating Factors   weather, attributes to allergies    Pain Relieving Factors  allergy medicine         [facility] PT Assessment - 04/08/18 1028      Assessment   Medical Diagnosis  CVA    Referring Provider  Dr. Leonie Man    Onset Date/Surgical Date  03/25/18      Precautions   Precautions  Fall    Precaution Comments  No driving      Balance Screen   Has the patient fallen in the past 6 months  No    Has the patient had a  decrease in activity level because of a fear of falling?   No    Is the patient reluctant to leave their home because of a fear of falling?   No      Prior Function   Level of Independence  Independent    Vocation  Retired    Leisure  plays music, sax, [employer] Services operational officer, enjoys walking outdoors       Observation/Other Assessments   Focus on Therapeutic Outcomes (FOTO)   Functional Intake Status:  54      ROM / Strength   AROM / PROM / Strength  Strength      Strength   Overall Strength  Within functional limits for tasks performed    Overall Strength Comments  Grossly tested 5/5 bilateral through lower extremities      Transfers   Transfers  Sit to Stand;Stand to Sit    Sit to Stand  5: Supervision;Without upper extremity assist;From chair/3-in-1    Stand to Sit  5: Supervision;Without upper extremity assist;To chair/3-in-1      Ambulation/Gait   Ambulation/Gait  Yes    Ambulation/Gait Assistance  4: Min guard    Ambulation Distance (Feet)  200 Feet    Assistive device  Rolling walker;None     Gait Pattern  Step-through pattern;Decreased arm swing - right;Decreased arm swing - left;Decreased step length - right;Decreased step length - left;Wide base of support    Ambulation Surface  Level;Indoor    Gait velocity  14.18 sec (2.31 ft/sec) with walker, 13.19 sec (2.49 ft/sec) no AD      Standardized Balance Assessment   Standardized Balance Assessment  Timed Up and Go Test;Berg Balance Test;Dynamic Gait Index      Berg Balance Test   Sit to Stand  Able to stand without using hands and stabilize independently    Standing Unsupported  Able to stand safely 2 minutes    Sitting with Back Unsupported but Feet Supported on Floor or Stool  Able to sit safely and securely 2 minutes    Stand to Sit  Sits safely with minimal use of hands    Transfers  Able to transfer safely, definite need of hands    Standing Unsupported with Eyes Closed  Able to stand 10 seconds safely    Standing Ubsupported with Feet Together  Able to place feet together independently and stand for 1 minute with supervision    From Standing, Reach Forward with Outstretched Arm  Reaches forward but needs supervision 9    From Standing Position, Pick up Object from Floor  Able to pick up shoe, needs supervision    From Standing Position, Turn to Look Behind Over each Shoulder  Looks behind from both sides and weight shifts well    Turn 360 Degrees  Able to turn 360 degrees safely but slowly    Standing Unsupported, Alternately Place Feet on Step/Stool  Able to stand independently and safely and complete 8 steps in 20 seconds    Standing Unsupported, One Foot in Front  Able to place foot tandem independently and hold 30 seconds    Standing on One Leg  Able to lift leg independently and hold equal to or more than 3 seconds    Total Score  46    Berg comment:  Scores <45/56 indicate increased fall risk      Dynamic Gait Index   Level Surface  Moderate Impairment    Change in Gait Speed  Moderate Impairment    Gait with  Horizontal Head Turns  Moderate Impairment    Gait with Vertical Head Turns  Moderate Impairment    Gait and Pivot Turn  Moderate Impairment    Step Over Obstacle  Moderate Impairment    Step Around Obstacles  Moderate Impairment    Steps  Mild Impairment    Total Score  9    DGI comment:  Scores <19/24 indicate increased fall risk.      Timed Up and Go Test   Normal TUG (seconds)  16.97 walker, 13.6 no device    TUG Comments  Scores >13.5 seconds indicate increased fall risk.                Objective measurements completed on examination: See above findings.              PT Education - 04/08/18 1225    Education provided  Yes    Education Details  Discussed fall risk per objective measures, and despite some testing performed without walker, PT recommends continued use of RW for safety with gait.  Answered wife's questions regarding safe method for getting in and out of car.    Person(s) Educated  Patient;Spouse    Methods  Explanation    Comprehension  Verbalized understanding       PT Short Term Goals - 04/08/18 1218      PT SHORT TERM GOAL #1   Title  Pt will be independent with HEP for improved balance and gait.  TARGET 05/08/18    Time  5    Period  Weeks    Status  New    Target Date  05/08/18      PT SHORT TERM GOAL #2   Title  Pt will improve TUG score to less than or equal to 13.5 seconds for decreased fall risk.    Time  5    Period  Weeks    Status  New    Target Date  05/08/18      PT SHORT TERM GOAL #3   Title  Pt will improve Berg score to at least 51/56 for decreased fall risk.    Time  5    Period  Weeks    Status  New    Target Date  05/08/18      PT SHORT TERM GOAL #4   Title  Pt will improve Dynamic Gait Index score to at least 13/24 for decreased fall risk.    Time  5    Period  Weeks    Status  New    Target Date  05/08/18      PT SHORT TERM GOAL #5   Title  Pt will negotiate 2 steps no rail, independently for safe  entry/exit into home.    Time  5    Period  Weeks    Status  New    Target Date  05/08/18        PT Long Term Goals - 04/08/18 1221      PT LONG TERM GOAL #1   Title  Pt will verbalize understanding of fall prevention in home environment.  TARGET 06/05/18    Time  9    Period  Weeks    Status  New    Target Date  06/05/18      PT LONG TERM GOAL #2   Title  Pt will improve DGI score to at least 19/24 for decreased fall risk.    Time  9    Period  Weeks    Status  New    Target Date  06/05/18      PT LONG TERM GOAL #3   Title  Pt will improve gait velocity to at least 2.62 ft/sec for improved community gait efficiency and safety.    Time  9    Period  Weeks    Status  New    Target Date  06/05/18      PT LONG TERM GOAL #4   Title  Pt will ambulate at least 1000 ft, indoor and outdoor gait, with least restrictive assistive device, modified independently, for improved safety with community gait.    Time  9    Period  Weeks    Status  New    Target Date  06/05/18      PT LONG TERM GOAL #5   Title  Pt will improve FOTO score by at least 20% for pt-reported improved functional mobility at discharge.    Time  9    Period  Weeks    Status  New    Target Date  06/05/18             Plan - 04/08/18 1211    Clinical Impression Statement  Pt is a 72 year old male who presents to OP PT with history of CVA on 03/25/18, with balance changes, vertigo (which has improved).  Prior to CVA, he was independent, now he is using RW due to unsteadiness.  He presentst to OPPT with decreased balance, decreased gait independence and safety (when not using assistive device).  He is at fall risk per BERG, TUG, and DGI scores.  He has slowed gait velocity.  Pt will benefit from skilled PT to address the above stated deficits for improved functional mobility and decreased fall risk.    History and Personal Factors relevant to plan of care:  PMH >3 co-morbidities, recent history of CVA-dependent  on RW    Clinical Presentation  Evolving    Clinical Presentation due to:  at fall risk per DGI, Berg, and TUG scores    Clinical Decision Making  Moderate    Rehab Potential  Good    PT Frequency  2x / week    PT Duration  -- 9 weeks    PT Treatment/Interventions  ADLs/Self Care Home Management;DME Instruction;Gait training;Stair training;Functional mobility training;Therapeutic activities;Therapeutic exercise;Balance training;Patient/family education;Neuromuscular re-education;Vestibular    PT Next Visit Plan  Vestibular assessment if needed; Sensory Organization Test vs standing EO/EC on foam; initiate HEP    Consulted and Agree with Plan of Care  Patient;Family member/caregiver    Family Member Consulted  wife       Patient will benefit from skilled therapeutic intervention in order to improve the following deficits and impairments:  Abnormal gait, Decreased activity tolerance, Decreased balance, Decreased coordination, Decreased mobility, Difficulty walking, Impaired vision/preception  Visit Diagnosis: Other abnormalities of gait and mobility  Unsteadiness on feet  Dizziness and giddiness     Problem List Patient Active Problem List   Diagnosis Date Noted  . Acute CVA (cerebrovascular accident) ([place]) 03/26/2018  . Obesity (BMI 30.0-34.9) 03/26/2018  . Hyperlipidemia 03/26/2018  . OSA on CPAP 03/26/2018  . Hypertension 03/26/2018  . GERD (gastroesophageal reflux disease) 03/26/2018  . Leukocytosis 03/26/2018  . Acute hyperglycemia 03/26/2018    MARRIOTT,AMY W. 04/08/2018, 12:27 PM  Frazier Butt., PT   [facility] [address] [place], [place], [address] Phone: [phone]   Fax:  [phone]  Name: Chris [name] MRN: [id number] Date of Birth: [birth year]

## 2018-04-08 NOTE — Therapy (Signed)
[facility] [address] [place], [place], [address] Phone: [phone]   Fax:  [phone]  Occupational Therapy Treatment  Patient Details  Name: Chris [name] MRN: [id number] Date of Birth: 10-31-1946 Referring Provider: Dr. Leonie Man   Encounter Date: 04/08/2018  OT End of Session - 04/08/18 1047    Visit Number  1    Number of Visits  17    Date for OT Re-Evaluation  06/07/18    Authorization Type  Aetna Medicare    OT Start Time  0930    OT Stop Time  1015    OT Time Calculation (min)  45 min    Activity Tolerance  Patient tolerated treatment well    Behavior During Therapy  [facility] for tasks assessed/performed       Past Medical History:  Diagnosis Date  . Acute CVA (cerebrovascular accident) ([place]) 03/25/2018   "double vision and balance issues" (03/26/2018)  . ADD (attention deficit disorder)   . Arthritis    "thumbs and fingers" (03/26/2018)  . Diverticulosis   . GERD (gastroesophageal reflux disease)   . Heart murmur    "when I was young; not noted since age 33" (03/26/2018)  . History of kidney stones   . Hyperlipidemia   . Hypertension   . IFG (impaired fasting glucose)   . Metabolic syndrome   . OSA on CPAP   . Pneumonia    "a time or 2" (03/26/2018)  . PONV (postoperative nausea and vomiting)   . Shingles ~ 2002  . TIA (transient ischemic attack) 2013    Past Surgical History:  Procedure Laterality Date  . CARPAL TUNNEL RELEASE Right   . CYSTOSCOPY W/ STONE MANIPULATION    . FINGER SURGERY Left    thumb surgery; "took bone out; put tendon in"  . TONSILLECTOMY AND ADENOIDECTOMY      There were no vitals filed for this visit.  Subjective Assessment - 04/08/18 0931    Subjective   Pt s/p CVA on 03/25/18 presented with vertigo symptoms and vomiting    Pertinent History  TIA, HTN, dyslipidemia, glucose intolerance sleep apnea with CPAP    Patient Stated Goals  get back to normal    Currently in Pain?   Yes    Pain Score  3     Pain Location  Head    Pain Descriptors / Indicators  Dull;Pressure    Pain Type  Acute pain    Pain Onset  1 to 4 weeks ago    Pain Frequency  Intermittent    Aggravating Factors   weather, attributes to allergies    Pain Relieving Factors  allergy medicine,     Multiple Pain Sites  No         [facility] OT Assessment - 04/08/18 0935      Assessment   Medical Diagnosis  CVA    Referring Provider  Dr. Leonie Man    Onset Date/Surgical Date  03/25/18    Hand Dominance  Right      Precautions   Precautions  Fall    Precaution Comments  no driving      Balance Screen   Has the patient fallen in the past 6 months  No    Has the patient had a decrease in activity level because of a fear of falling?   No    Is the patient reluctant to leave their home because of a fear of falling?   No  Home  Environment   Family/patient expects to be discharged to:  Private residence    [place] to live on main level with bedroom/bathroom    Lives With  Spouse      Prior Function   Level of Independence  Independent    Vocation  Retired    Biomedical scientist  retired Nature conservation officer, has Conservator, museum/gallery    Leisure  plays music, sax, obo      ADL   Eating/Feeding  Independent    Grooming  Independent    Scientist, clinical (histocompatibility and immunogenetics)  Supervision/safety    Lower Body Bathing  Supervision/safety has shower seat    Upper Body Dressing  Supervision/safety    Lower Body Dressing  Supervision/safety therapist recommends sitting to donn pants    Toilet Transfer  Modified independent with walker    Tub/Shower Transfer  Supervision/safety      IADL   Shopping  Needs to be accompanied on any shopping trip    [place]  -- Pt perfromed yardwork prior to CVA, has not attempted    Meal Prep  -- Pt's wife perfroms primarily    Financial Management  -- wife is perfroming      Mobility   Mobility Status  -- supervision with walker       Written Expression   Dominant Hand  Right    Handwriting  100% legible      Vision - History   Baseline Vision  Wears glasses all the time    Visual History  Cataracts    Patient Visual Report  -- floater      Vision Assessment   Vision Assessment  Vision tested    Ocular Range of Motion  Within Functional Limits    Tracking/Visual Pursuits  Decreased smoothing of vertical tracking    Saccades  Additional eye shifts occurred during testing    Diplopia Assessment  Disappears with one eye closed    Depth Perception  overshoots/ undershoots    Patient has diffculty with activities due to visual impairment  Reading bills    Comment  Pt reports reading is difficult he can only read for short time periods( read 10 page document), removed partial oclusion yesterday      Cognition   Overall Cognitive Status  Impaired/Different from baseline    Attention  Selective    Selective Attention  Impaired    Memory  Impaired    Memory Impairment  Decreased short term memory Pt reports,  4/5 items recalled on MOCA    MOCA  26/30-WNLS difficulty with trailmaking B, alternating attention      Observation/Other Assessments   Standing Functional Reach Test  RUE 13 in, LUE 11.5 in      Sensation   Light Touch  Appears Intact      Coordination   Fine Motor Movements are Fluid and Coordinated  No    Coordination and Movement Description  Pt slid pegs along pegboard rather than picking up too place in holes    9 Hole Peg Test  Right;Left    Right 9 Hole Peg Test  25.66 secs    Left 9 Hole Peg Test  30.79 secs    Coordination  Pt reports difficulty reaching for items due to deceased depth perception.      ROM / Strength   AROM / PROM / Strength  AROM      AROM   Overall AROM  Within functional limits for tasks performed      Strength   Overall Strength  Within functional limits for tasks performed      Hand Function   Right Hand Grip (lbs)  75 lbs    Left Hand Grip (lbs)  65 lbs                          OT Short Term Goals - 04/08/18 1048      OT SHORT TERM GOAL #1   Title  I with HEP.    Time  4    Period  Weeks    Status  New      OT SHORT TERM GOAL #2   Title  Pt will verbalize understanding of compensatory strategies for visual deficits.    Time  4    Period  Weeks    Status  New      OT SHORT TERM GOAL #3   Title  Pt will perform all basic ADLs modified independently    Time  4    Period  Weeks    Status  New      OT SHORT TERM GOAL #4   Title  Pt will report ability to read for 30 mins with minimal reports of diplopia.    Time  4    Period  Weeks    Status  New        OT Long Term Goals - 04/08/18 1050      OT LONG TERM GOAL #1   Title  Pt will perform home managment activities at a modified independent level without LOB.    Time  8    Period  Weeks    Status  New      OT LONG TERM GOAL #2   Title  Pt will perform mod complex environmental scanning activities with 95% accuracy and no LOB.    Time  8    Period  Weeks    Status  New      OT LONG TERM GOAL #3   Title  Pt will verbalize understanding of compensatory strategies for short term memory deficits    Time  8    Period  Weeks    Status  New      OT LONG TERM GOAL #4   Title  Pt will demonstrate ability to perfrom alternating attention task with a visual componant with 90% or better accuracy in prep for driving.    Time  8    Period  Weeks    Status  New            Plan - 04/08/18 1544    Clinical Impression Statement  Pt is a 72 y.o male admitted 03/25/18 with dizziness,nausea /vomiting and vertigo symptoms, MRI revealed L parietal infarct(symptoms were originally thought to be related to brainstem CVA not visualized on imaging). Pt presents with the following deficits: visual perceptual deficits, diplopia, cognitive deficits and decreased balance which impedes performance of ADLs, IADLs. Pt can benefit from skilled occupational therapy to maximize pt's  safety and independence with daily activities.    Occupational Profile and client history currently impacting functional performance  PMH: TIA, HTN, hyperlipidemia, OSA on CPAP. 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, currently he requires supervision with ADLs and is unable to drive    Occupational performance deficits (Please refer to evaluation for details):  ADL's;IADL's;Play;Leisure;Social Participation  Rehab Potential  Good    Current Impairments/barriers affecting progress:  visual perceptual deficits, diplopia, decreased balance, cognitive deficits    OT Frequency  2x / week    OT Duration  8 weeks    OT Treatment/Interventions  Self-care/ADL training;Therapeutic exercise;Visual/perceptual remediation/compensation;Patient/family education;Neuromuscular education;Moist Heat;Energy conservation;Therapist, nutritional;Therapeutic activities;Balance training;Cognitive remediation/compensation;Manual Therapy;DME and/or AE instruction    Plan  initiate vision HEP    Clinical Decision Making  Several treatment options, min-mod task modification necessary    Consulted and Agree with Plan of Care  Patient;Family member/caregiver       Patient will benefit from skilled therapeutic intervention in order to improve the following deficits and impairments:  Abnormal gait, Decreased cognition, Decreased knowledge of use of DME, Impaired vision/preception, Decreased mobility, Decreased activity tolerance, Decreased coordination, Impaired perceived functional ability, Difficulty walking, Decreased safety awareness, Decreased knowledge of precautions, Decreased balance  Visit Diagnosis: Visuospatial deficit - Plan: Ot plan of care cert/re-cert  Unsteadiness on feet - Plan: Ot plan of care cert/re-cert  Other abnormalities of gait and mobility - Plan: Ot plan of care cert/re-cert  Cognitive social or emotional deficit following cerebral infarction -  Plan: Ot plan of care cert/re-cert    Problem List Patient Active Problem List   Diagnosis Date Noted  . Acute CVA (cerebrovascular accident) ([place]) 03/26/2018  . Obesity (BMI 30.0-34.9) 03/26/2018  . Hyperlipidemia 03/26/2018  . OSA on CPAP 03/26/2018  . Hypertension 03/26/2018  . GERD (gastroesophageal reflux disease) 03/26/2018  . Leukocytosis 03/26/2018  . Acute hyperglycemia 03/26/2018    RINE,KATHRYN 04/08/2018, 4:01 PM Theone Murdoch, OTR/L [phone] Phone: [phone] 4:01 PM 04/08/18 [facility] [address] [place], [place], [address] Phone: [phone]   Fax:  [phone]  Name: JUAQUIN LUDINGTON MRN: [id number] Date of Birth: 1946-09-21

## 2018-04-09 ENCOUNTER — Encounter: Payer: Self-pay | Admitting: Physical Therapy

## 2018-04-09 ENCOUNTER — Ambulatory Visit: Payer: Medicare HMO | Admitting: Physical Therapy

## 2018-04-09 DIAGNOSIS — R2681 Unsteadiness on feet: Secondary | ICD-10-CM

## 2018-04-09 DIAGNOSIS — R2689 Other abnormalities of gait and mobility: Secondary | ICD-10-CM | POA: Diagnosis not present

## 2018-04-09 DIAGNOSIS — R42 Dizziness and giddiness: Secondary | ICD-10-CM | POA: Diagnosis not present

## 2018-04-09 DIAGNOSIS — R41842 Visuospatial deficit: Secondary | ICD-10-CM | POA: Diagnosis not present

## 2018-04-09 DIAGNOSIS — I69315 Cognitive social or emotional deficit following cerebral infarction: Secondary | ICD-10-CM | POA: Diagnosis not present

## 2018-04-09 NOTE — Therapy (Signed)
[facility] [address] [place], [place], [address] Phone: [phone]   Fax:  [phone]  Physical Therapy Treatment  Patient Details  Name: Chris [name] MRN: [id number] Date of Birth: 1946/11/18 Referring Provider: Dr. Leonie Man   Encounter Date: 04/09/2018  PT End of Session - 04/09/18 2037    Visit Number  2    Number of Visits  18    Date for PT Re-Evaluation  07/07/18    Authorization Type  [employer] Medicare    PT Start Time  1021    PT Stop Time  1103    PT Time Calculation (min)  42 min    Activity Tolerance  Patient tolerated treatment well    Behavior During Therapy  [facility] for tasks assessed/performed       Past Medical History:  Diagnosis Date  . Acute CVA (cerebrovascular accident) ([place]) 03/25/2018   "double vision and balance issues" (03/26/2018)  . ADD (attention deficit disorder)   . Arthritis    "thumbs and fingers" (03/26/2018)  . Diverticulosis   . GERD (gastroesophageal reflux disease)   . Heart murmur    "when I was young; not noted since age 81" (03/26/2018)  . History of kidney stones   . Hyperlipidemia   . Hypertension   . IFG (impaired fasting glucose)   . Metabolic syndrome   . OSA on CPAP   . Pneumonia    "a time or 2" (03/26/2018)  . PONV (postoperative nausea and vomiting)   . Shingles ~ 2002  . TIA (transient ischemic attack) 2013    Past Surgical History:  Procedure Laterality Date  . CARPAL TUNNEL RELEASE Right   . CYSTOSCOPY W/ STONE MANIPULATION    . FINGER SURGERY Left    thumb surgery; "took bone out; put tendon in"  . TONSILLECTOMY AND ADENOIDECTOMY      There were no vitals filed for this visit.  Subjective Assessment - 04/09/18 1024    Subjective  No changes, just ready to work.  (Pended)     Patient is accompained by:  Family member  (Pended)  wife, Edwena Felty    Patient Stated Goals  Pt's goals for PT are to get back to normal, do things around the house, and drive   (Pended)     Currently in Pain?  No/denies  (Pended)     Pain Onset  1 to 4 weeks ago  (Pended)                Neuro re-ed: sensory organization test performed with following results: Conditions: 1: WFL 2: WFL 3: below normal limits for trial 1and 3  4: below normal limits for trial 1 and 2 5: FALL on trial 1 and 2 6: FALL on trial 2 Composite score: 54 (below normal for age/height related norms) Sensory Analysis Som: WFL Vis: WFL Vest: below normal limits   Self Care:  Educated patient on results of SOT, relating SOT conditions to real-life situations, and how PT plans to address with exercises.  Discussed compensations and safety for situations with unlevel surfaces and lowly lit areas.               [facility] Adult PT Treatment/Exercise - 04/09/18 0001      Ambulation/Gait   Ambulation/Gait  Yes    Ambulation/Gait Assistance  4: Min guard    Ambulation Distance (Feet)  100 Feet x 2; 15 ft x 2    Assistive device  None  Gait Pattern  Step-through pattern;Decreased arm swing - right;Decreased arm swing - left;Decreased step length - right;Decreased step length - left;Wide base of support    Ambulation Surface  Level;Indoor      Standardized Balance Assessment   Standardized Balance Assessment  Balance Master Testing          Balance Exercises - 04/09/18 2035      Balance Exercises: Standing   Standing Eyes Opened  Wide ([place]);Narrow base of support (BOS);Head turns;Solid surface;5 reps Head nods     Standing Eyes Closed  Wide (BOA);Solid surface;Head turns;5 reps Head nods; narrow BOS-stand EC head steady, 10 sec x 2        PT Education - 04/09/18 2036    Education provided  Yes    Education Details  Results of Sensory Organization test and how to address vestibular deficits; HEP    Person(s) Educated  Patient;Spouse    Methods  Explanation;Demonstration;Handout    Comprehension  Verbalized understanding;Returned demonstration       PT Short Term  Goals - 04/08/18 1218      PT SHORT TERM GOAL #1   Title  Pt will be independent with HEP for improved balance and gait.  TARGET 05/08/18    Time  5    Period  Weeks    Status  New    Target Date  05/08/18      PT SHORT TERM GOAL #2   Title  Pt will improve TUG score to less than or equal to 13.5 seconds for decreased fall risk.    Time  5    Period  Weeks    Status  New    Target Date  05/08/18      PT SHORT TERM GOAL #3   Title  Pt will improve Berg score to at least 51/56 for decreased fall risk.    Time  5    Period  Weeks    Status  New    Target Date  05/08/18      PT SHORT TERM GOAL #4   Title  Pt will improve Dynamic Gait Index score to at least 13/24 for decreased fall risk.    Time  5    Period  Weeks    Status  New    Target Date  05/08/18      PT SHORT TERM GOAL #5   Title  Pt will negotiate 2 steps no rail, independently for safe entry/exit into home.    Time  5    Period  Weeks    Status  New    Target Date  05/08/18        PT Long Term Goals - 04/08/18 1221      PT LONG TERM GOAL #1   Title  Pt will verbalize understanding of fall prevention in home environment.  TARGET 06/05/18    Time  9    Period  Weeks    Status  New    Target Date  06/05/18      PT LONG TERM GOAL #2   Title  Pt will improve DGI score to at least 19/24 for decreased fall risk.    Time  9    Period  Weeks    Status  New    Target Date  06/05/18      PT LONG TERM GOAL #3   Title  Pt will improve gait velocity to at least 2.62 ft/sec for improved community gait efficiency and safety.  Time  9    Period  Weeks    Status  New    Target Date  06/05/18      PT LONG TERM GOAL #4   Title  Pt will ambulate at least 1000 ft, indoor and outdoor gait, with least restrictive assistive device, modified independently, for improved safety with community gait.    Time  9    Period  Weeks    Status  New    Target Date  06/05/18      PT LONG TERM GOAL #5   Title  Pt will improve  FOTO score by at least 20% for pt-reported improved functional mobility at discharge.    Time  9    Period  Weeks    Status  New    Target Date  06/05/18            Plan - 04/09/18 2037    Clinical Impression Statement  Sensory Organization test performed today, with pt noted to have overall decreased composite balance score, decreased vestibular system use for balance.  Educated patient/wife on full results of SOT and began addressing vestibular deficits by performing corner balance exercises.  Pt will continue to benefit from skilled PT to address balance, mobility deficits for decreased fallrisk.    Rehab Potential  Good    PT Frequency  2x / week    PT Duration  -- 9 weeks    PT Treatment/Interventions  ADLs/Self Care Home Management;DME Instruction;Gait training;Stair training;Functional mobility training;Therapeutic activities;Therapeutic exercise;Balance training;Patient/family education;Neuromuscular re-education;Vestibular    PT Next Visit Plan  Review HEP and progress corner balance exercises; hip/ankle strategy work, compliant surfaces and gait training without assistive device.    Consulted and Agree with Plan of Care  Patient;Family member/caregiver    Family Member Consulted  wife       Patient will benefit from skilled therapeutic intervention in order to improve the following deficits and impairments:  Abnormal gait, Decreased activity tolerance, Decreased balance, Decreased coordination, Decreased mobility, Difficulty walking, Impaired vision/preception  Visit Diagnosis: Unsteadiness on feet     Problem List Patient Active Problem List   Diagnosis Date Noted  . Acute CVA (cerebrovascular accident) ([place]) 03/26/2018  . Obesity (BMI 30.0-34.9) 03/26/2018  . Hyperlipidemia 03/26/2018  . OSA on CPAP 03/26/2018  . Hypertension 03/26/2018  . GERD (gastroesophageal reflux disease) 03/26/2018  . Leukocytosis 03/26/2018  . Acute hyperglycemia 03/26/2018     Josaiah Muhammed W. 04/09/2018, 8:40 PM  Frazier Butt., PT   [facility] [address] [place], [place], [address] Phone: [phone]   Fax:  [phone]  Name: Chris [name] MRN: [id number] Date of Birth: [birth year]

## 2018-04-09 NOTE — Patient Instructions (Addendum)
Feet Apart, Head Motion - Eyes Open    With eyes open, feet apart, move head slowly: up and down 5-10 times.  The move head side to side. Repeat __5-10__ times per session. Do __1-2__ sessions per day.  Copyright  VHI. All rights reserved.  Feet Apart, Head Motion - Eyes Closed    With eyes closed and feet shoulder width apart, move head slowly, up and down 5-10 times.  Then move head side to side Repeat __5-10__ times per session. Do ___1-2_ sessions per day.  Copyright  VHI. All rights reserved.

## 2018-04-13 ENCOUNTER — Encounter: Payer: Self-pay | Admitting: Physical Therapy

## 2018-04-13 ENCOUNTER — Ambulatory Visit: Payer: Medicare HMO | Admitting: Physical Therapy

## 2018-04-13 DIAGNOSIS — I69315 Cognitive social or emotional deficit following cerebral infarction: Secondary | ICD-10-CM | POA: Diagnosis not present

## 2018-04-13 DIAGNOSIS — J302 Other seasonal allergic rhinitis: Secondary | ICD-10-CM | POA: Diagnosis not present

## 2018-04-13 DIAGNOSIS — R41842 Visuospatial deficit: Secondary | ICD-10-CM | POA: Diagnosis not present

## 2018-04-13 DIAGNOSIS — K219 Gastro-esophageal reflux disease without esophagitis: Secondary | ICD-10-CM | POA: Diagnosis not present

## 2018-04-13 DIAGNOSIS — R2689 Other abnormalities of gait and mobility: Secondary | ICD-10-CM | POA: Diagnosis not present

## 2018-04-13 DIAGNOSIS — R2681 Unsteadiness on feet: Secondary | ICD-10-CM | POA: Diagnosis not present

## 2018-04-13 DIAGNOSIS — R42 Dizziness and giddiness: Secondary | ICD-10-CM | POA: Diagnosis not present

## 2018-04-13 DIAGNOSIS — I10 Essential (primary) hypertension: Secondary | ICD-10-CM | POA: Diagnosis not present

## 2018-04-13 DIAGNOSIS — I69398 Other sequelae of cerebral infarction: Secondary | ICD-10-CM | POA: Diagnosis not present

## 2018-04-13 DIAGNOSIS — N529 Male erectile dysfunction, unspecified: Secondary | ICD-10-CM | POA: Diagnosis not present

## 2018-04-13 DIAGNOSIS — E785 Hyperlipidemia, unspecified: Secondary | ICD-10-CM | POA: Diagnosis not present

## 2018-04-13 DIAGNOSIS — Z7902 Long term (current) use of antithrombotics/antiplatelets: Secondary | ICD-10-CM | POA: Diagnosis not present

## 2018-04-13 DIAGNOSIS — R110 Nausea: Secondary | ICD-10-CM | POA: Diagnosis not present

## 2018-04-13 NOTE — Patient Instructions (Addendum)
Feet Apart (Compliant Surface) Head Motion - Eyes Open    With eyes open, standing on compliant surface: _pillow or towel______, feet shoulder width apart, move head slowly: up and down.  Repeat 5-10 times.  Then head nods  Repeat _5-10___ times per session. Do _1-2___ sessions per day.  Copyright  VHI. All rights reserved.  Feet Apart (Compliant Surface) Varied Arm Positions - Eyes Closed    Stand on compliant surface: _pillow or towel_______ with feet shoulder width apart and arms out. Close eyes and visualize upright position. Hold_10___ seconds. Repeat __3__ times per session. Do __1-2__ sessions per day.  Copyright  VHI. All rights reserved.  Feet Heel-Toe "Tandem", Arm Motion - Eyes Open    With eyes open, right foot directly in front of the other, stand steady and hold for 10 seconds Repeat _3___ times per session, each foot position. Do __1-2__ sessions per day.  Copyright  VHI. All rights reserved.  Single Leg - Eyes Open    Holding support, lift right leg while maintaining balance over other leg. Progress to removing hands from support surface for longer periods of time. Hold__10__ seconds. Repeat __3__ times per session, each leg. Do __1-2__ sessions per day.  Copyright  VHI. All rights reserved.  Side-Stepping    Walk to left side with eyes open. Take even steps, leading with same foot. Make sure each foot lifts off the floor. Repeat in opposite direction. Repeat for __3-5__ times the length of your counter. Do __1-2__ sessions per day.   Copyright  VHI. All rights reserved.

## 2018-04-13 NOTE — Therapy (Signed)
[facility] [address] [place], [place], [address] Phone: [phone]   Fax:  [phone]  Physical Therapy Treatment  Patient Details  Name: Chris [name] MRN: [id number] Date of Birth: 25-May-1946 Referring Provider: Dr. Leonie Man   Encounter Date: 04/13/2018  PT End of Session - 04/13/18 2034    Visit Number  3    Number of Visits  18    Date for PT Re-Evaluation  07/07/18    Authorization Type  Aetna Medicare    PT Start Time  1406    PT Stop Time  1447    PT Time Calculation (min)  41 min    Activity Tolerance  Patient tolerated treatment well    Behavior During Therapy  [facility] for tasks assessed/performed       Past Medical History:  Diagnosis Date  . Acute CVA (cerebrovascular accident) ([place]) 03/25/2018   "double vision and balance issues" (03/26/2018)  . ADD (attention deficit disorder)   . Arthritis    "thumbs and fingers" (03/26/2018)  . Diverticulosis   . GERD (gastroesophageal reflux disease)   . Heart murmur    "when I was young; not noted since age 17" (03/26/2018)  . History of kidney stones   . Hyperlipidemia   . Hypertension   . IFG (impaired fasting glucose)   . Metabolic syndrome   . OSA on CPAP   . Pneumonia    "a time or 2" (03/26/2018)  . PONV (postoperative nausea and vomiting)   . Shingles ~ 2002  . TIA (transient ischemic attack) 2013    Past Surgical History:  Procedure Laterality Date  . CARPAL TUNNEL RELEASE Right   . CYSTOSCOPY W/ STONE MANIPULATION    . FINGER SURGERY Left    thumb surgery; "took bone out; put tendon in"  . TONSILLECTOMY AND ADENOIDECTOMY      There were no vitals filed for this visit.  Subjective Assessment - 04/13/18 1409    Subjective  Went to church yesterday and that was a challenging situation so I'm glad I had the walker.    Patient is accompained by:  Family member wife, Edwena Felty    Patient Stated Goals  Pt's goals for PT are to get back to normal, do  things around the house, and drive    Currently in Pain?  No/denies    Pain Onset  1 to 4 weeks ago                       [facility] Adult PT Treatment/Exercise - 04/13/18 0001      Ambulation/Gait   Ambulation/Gait  Yes    Ambulation/Gait Assistance  4: Min guard    Ambulation Distance (Feet)  15 Feet x 2, 120 ft no device, x 2 reps    Assistive device  None    Gait Pattern  Step-through pattern;Decreased arm swing - right;Decreased arm swing - left;Decreased step length - right;Decreased step length - left;Wide base of support    Ambulation Surface  Level;Indoor    Gait Comments  PT provides cues for relaxed trunk rotation, arm swing, cues for relaxed pace with gait, use of visual targets with gait      High Level Balance   High Level Balance Comments  With balance exercises, provided cues for use of visual target, for abdominal activation, and for use of light UE support at needed, for improved stability with balance exercises.  Balance Exercises - 04/13/18 1420      Balance Exercises: Standing   Standing Eyes Opened  Wide (BOA);Narrow base of support (BOS);Head turns;Solid surface;Foam/compliant surface Head nods x 10; review of HEP, then worked on foam    Standing Eyes Closed  Wide (BOA);Narrow base of support (BOS);Head turns;Solid surface;5 reps;10 secs;Foam/compliant surface UE support as needed; added foam with EC 10 sec    Tandem Stance  Eyes open;3 reps;10 secs    SLS  Eyes open;Upper extremity support 1;3 reps;10 secs    Partial Tandem Stance  Eyes closed;3 reps;Intermittent upper extremity support;10 secs    Retro Gait  Upper extremity support;3 reps Forward/back walking    Sidestepping  Upper extremity support;3 reps along counter    Marching Limitations  Marching forward 3 reps along counter    Heel Raises Limitations  10 reps    Toe Raise Limitations  10 reps    Other Standing Exercises  At counter, attempted hip strategy work in addition to  ankle strategy x 5 reps anterior/posterior direction        PT Education - 04/13/18 2034    Education provided  Yes    Education Details  HEP updates    Person(s) Educated  Patient;Spouse    Methods  Explanation;Demonstration;Handout    Comprehension  Verbalized understanding;Returned demonstration       PT Short Term Goals - 04/08/18 1218      PT SHORT TERM GOAL #1   Title  Pt will be independent with HEP for improved balance and gait.  TARGET 05/08/18    Time  5    Period  Weeks    Status  New    Target Date  05/08/18      PT SHORT TERM GOAL #2   Title  Pt will improve TUG score to less than or equal to 13.5 seconds for decreased fall risk.    Time  5    Period  Weeks    Status  New    Target Date  05/08/18      PT SHORT TERM GOAL #3   Title  Pt will improve Berg score to at least 51/56 for decreased fall risk.    Time  5    Period  Weeks    Status  New    Target Date  05/08/18      PT SHORT TERM GOAL #4   Title  Pt will improve Dynamic Gait Index score to at least 13/24 for decreased fall risk.    Time  5    Period  Weeks    Status  New    Target Date  05/08/18      PT SHORT TERM GOAL #5   Title  Pt will negotiate 2 steps no rail, independently for safe entry/exit into home.    Time  5    Period  Weeks    Status  New    Target Date  05/08/18        PT Long Term Goals - 04/08/18 1221      PT LONG TERM GOAL #1   Title  Pt will verbalize understanding of fall prevention in home environment.  TARGET 06/05/18    Time  9    Period  Weeks    Status  New    Target Date  06/05/18      PT LONG TERM GOAL #2   Title  Pt will improve DGI score to at least 19/24 for decreased fall  risk.    Time  9    Period  Weeks    Status  New    Target Date  06/05/18      PT LONG TERM GOAL #3   Title  Pt will improve gait velocity to at least 2.62 ft/sec for improved community gait efficiency and safety.    Time  9    Period  Weeks    Status  New    Target Date   06/05/18      PT LONG TERM GOAL #4   Title  Pt will ambulate at least 1000 ft, indoor and outdoor gait, with least restrictive assistive device, modified independently, for improved safety with community gait.    Time  9    Period  Weeks    Status  New    Target Date  06/05/18      PT LONG TERM GOAL #5   Title  Pt will improve FOTO score by at least 20% for pt-reported improved functional mobility at discharge.    Time  9    Period  Weeks    Status  New    Target Date  06/05/18            Plan - 04/13/18 2035    Clinical Impression Statement  Continued to address decreased vestibular system use for balance with corner balance exercises and counter balance exercises to emphasize stability and mobility.  Also finished session with 2 bouts of gait without device, with cues for improved relaxed arm swing and gait pattern.  Pt requires intermittent UE support with balance exercises on compliant surfaces with EC, and he continues to be guarded with gait when not using assistive device.  Pt will continue to beneift from skilled PT to address balance, mobility deficits for decreased fall risk.    Rehab Potential  Good    PT Frequency  2x / week    PT Duration  -- 9 weeks    PT Treatment/Interventions  ADLs/Self Care Home Management;DME Instruction;Gait training;Stair training;Functional mobility training;Therapeutic activities;Therapeutic exercise;Balance training;Patient/family education;Neuromuscular re-education;Vestibular    PT Next Visit Plan  Review updated HEP, hip/ankle strategy work, compliant surfaces, gait training without assistive device (work on safe progression at home)    Consulted and Agree with Plan of Care  Patient;Family member/caregiver    Family Member Consulted  wife       Patient will benefit from skilled therapeutic intervention in order to improve the following deficits and impairments:  Abnormal gait, Decreased activity tolerance, Decreased balance, Decreased  coordination, Decreased mobility, Difficulty walking, Impaired vision/preception  Visit Diagnosis: Unsteadiness on feet  Other abnormalities of gait and mobility     Problem List Patient Active Problem List   Diagnosis Date Noted  . Acute CVA (cerebrovascular accident) ([place]) 03/26/2018  . Obesity (BMI 30.0-34.9) 03/26/2018  . Hyperlipidemia 03/26/2018  . OSA on CPAP 03/26/2018  . Hypertension 03/26/2018  . GERD (gastroesophageal reflux disease) 03/26/2018  . Leukocytosis 03/26/2018  . Acute hyperglycemia 03/26/2018    Gladies Sofranko W. 04/13/2018, 8:38 PM  Frazier Butt., PT   [facility] [address] [place], [place], [address] Phone: [phone]   Fax:  [phone]  Name: Chris [name] MRN: [id number] Date of Birth: 1946-05-08

## 2018-04-14 ENCOUNTER — Other Ambulatory Visit: Payer: Self-pay | Admitting: *Deleted

## 2018-04-14 NOTE — Patient Outreach (Signed)
[facility]) Care Management  04/14/2018  Chris [name] April 19, 1946 [id number]  EMMI-Stroke-Red Alert referral-Day #13, , 04/12/2018: Has follow up appointment -no  Telephone call x 1 to patient who was advised of reason for call.   Patient voices that he does have follow up appointment scheduled with neurologist on May 6th. States he has already had follow up appointment with his primary care and next appointment is scheduled for next week. Voices that he is currently attending appointments for outpatient physical & occupational; therapy twice weekly. States family member provides transportation for him.  Voices that she uses walker to get around and has not had any falls. States he manages his own medications and is consistent with taking as prescribed by his doctors.  States he has not had any new stroke symptoms but understands to call 911 or have family member call if symptoms occur.   States he does not have any health concerns currently and thanked this Electronics engineer for calling.  EMMI referral has been addressed.  Plan> Close out case.  Sherrin Daisy, RN BSN [facility] Management Coordinator [facility] Care Management  [phone]

## 2018-04-15 ENCOUNTER — Ambulatory Visit: Payer: Medicare HMO | Admitting: Occupational Therapy

## 2018-04-15 DIAGNOSIS — I69315 Cognitive social or emotional deficit following cerebral infarction: Secondary | ICD-10-CM

## 2018-04-15 DIAGNOSIS — R2689 Other abnormalities of gait and mobility: Secondary | ICD-10-CM | POA: Diagnosis not present

## 2018-04-15 DIAGNOSIS — R41842 Visuospatial deficit: Secondary | ICD-10-CM

## 2018-04-15 DIAGNOSIS — R2681 Unsteadiness on feet: Secondary | ICD-10-CM

## 2018-04-15 DIAGNOSIS — R42 Dizziness and giddiness: Secondary | ICD-10-CM | POA: Diagnosis not present

## 2018-04-15 NOTE — Therapy (Signed)
[facility] [address] [place], [place], [address] Phone: [phone]   Fax:  [phone]  Occupational Therapy Treatment  Patient Details  Name: Chris [name] MRN: [id number] Date of Birth: 18-Jun-1946 Referring Provider: Dr. Leonie Man   Encounter Date: 04/15/2018  OT End of Session - 04/15/18 1318    Visit Number  2    Number of Visits  17    Date for OT Re-Evaluation  06/07/18    Authorization Type  [employer]    OT Start Time  0804    OT Stop Time  0845    OT Time Calculation (min)  41 min       Past Medical History:  Diagnosis Date  . Acute CVA (cerebrovascular accident) ([place]) 03/25/2018   "double vision and balance issues" (03/26/2018)  . ADD (attention deficit disorder)   . Arthritis    "thumbs and fingers" (03/26/2018)  . Diverticulosis   . GERD (gastroesophageal reflux disease)   . Heart murmur    "when I was young; not noted since age 54" (03/26/2018)  . History of kidney stones   . Hyperlipidemia   . Hypertension   . IFG (impaired fasting glucose)   . Metabolic syndrome   . OSA on CPAP   . Pneumonia    "a time or 2" (03/26/2018)  . PONV (postoperative nausea and vomiting)   . Shingles ~ 2002  . TIA (transient ischemic attack) 2013    Past Surgical History:  Procedure Laterality Date  . CARPAL TUNNEL RELEASE Right   . CYSTOSCOPY W/ STONE MANIPULATION    . FINGER SURGERY Left    thumb surgery; "took bone out; put tendon in"  . TONSILLECTOMY AND ADENOIDECTOMY      There were no vitals filed for this visit.  Subjective Assessment - 04/15/18 1322    Subjective   Pt reports he is doing better and has less diplopia    Pertinent History  TIA, HTN, dyslipidemia, glucose intolerance sleep apnea with CPAP    Patient Stated Goals  get back to normal    Currently in Pain?  No/denies          Treatment: Environmental scanning 13/14 items located, min v.c to slow down during ambulation/ reaching  for cards for safety Copying small peg design for visual perceptual skills , only 1 peg that was wrong color, otherwise design was correct. Number cancellation task, with 2 errors, therapist  reviewed with patient.                 OT Education - 04/15/18 1316    Education provided  Yes    Education Details  vision HEP, recommendations for activities to challenge vision, importance of slowing down during mobilty and sitting for safety with showering    Person(s) Educated  Patient;Spouse    Methods  Explanation;Demonstration;Verbal cues;Handout    Comprehension  Verbalized understanding;Returned demonstration       OT Short Term Goals - 04/08/18 1048      OT SHORT TERM GOAL #1   Title  I with HEP.    Time  4    Period  Weeks    Status  New      OT SHORT TERM GOAL #2   Title  Pt will verbalize understanding of compensatory strategies for visual deficits.    Time  4    Period  Weeks    Status  New      OT SHORT TERM GOAL #  3   Title  Pt will perform all basic ADLs modified independently    Time  4    Period  Weeks    Status  New      OT SHORT TERM GOAL #4   Title  Pt will report ability to read for 30 mins with minimal reports of diplopia.    Time  4    Period  Weeks    Status  New        OT Long Term Goals - 04/08/18 1050      OT LONG TERM GOAL #1   Title  Pt will perform home managment activities at a modified independent level without LOB.    Time  8    Period  Weeks    Status  New      OT LONG TERM GOAL #2   Title  Pt will perform mod complex environmental scanning activities with 95% accuracy and no LOB.    Time  8    Period  Weeks    Status  New      OT LONG TERM GOAL #3   Title  Pt will verbalize understanding of compensatory strategies for short term memory deficits    Time  8    Period  Weeks    Status  New      OT LONG TERM GOAL #4   Title  Pt will demonstrate ability to perfrom alternating attention task with a visual componant with  90% or better accuracy in prep for driving.    Time  8    Period  Weeks    Status  New            Plan - 04/15/18 1319    Clinical Impression Statement  Pt is progressing towards goals. He demonstrates improving visual skills and demonstrates understanding of HEP.    Occupational Profile and client history currently impacting functional performance  PMH: TIA, HTN, hyperlipidemia, OSA on CPAP. 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, currently he requires supervision with ADLs and is unable to drive    Occupational performance deficits (Please refer to evaluation for details):  ADL's;IADL's;Play;Leisure;Social Participation    Rehab Potential  Good    Current Impairments/barriers affecting progress:  visual perceptual deficits, diplopia, decreased balance, cognitive deficits    OT Frequency  2x / week    OT Duration  8 weeks    OT Treatment/Interventions  Self-care/ADL training;Therapeutic exercise;Visual/perceptual remediation/compensation;Patient/family education;Neuromuscular education;Moist Heat;Energy conservation;Therapist, nutritional;Therapeutic activities;Balance training;Cognitive remediation/compensation;Manual Therapy;DME and/or AE instruction    Plan  check on HEP, dynamic balance activities, memory compensations    Consulted and Agree with Plan of Care  Patient;Family member/caregiver       Patient will benefit from skilled therapeutic intervention in order to improve the following deficits and impairments:  Abnormal gait, Decreased cognition, Decreased knowledge of use of DME, Impaired vision/preception, Decreased mobility, Decreased activity tolerance, Decreased coordination, Impaired perceived functional ability, Difficulty walking, Decreased safety awareness, Decreased knowledge of precautions, Decreased balance  Visit Diagnosis: Visuospatial deficit  Cognitive social or emotional deficit following cerebral  infarction  Unsteadiness on feet    Problem List Patient Active Problem List   Diagnosis Date Noted  . Acute CVA (cerebrovascular accident) ([place]) 03/26/2018  . Obesity (BMI 30.0-34.9) 03/26/2018  . Hyperlipidemia 03/26/2018  . OSA on CPAP 03/26/2018  . Hypertension 03/26/2018  . GERD (gastroesophageal reflux disease) 03/26/2018  . Leukocytosis 03/26/2018  . Acute  hyperglycemia 03/26/2018    Lakeishia Truluck 04/15/2018, 1:23 PM  [facility] [address] [place], [place], [address] Phone: [phone]   Fax:  [phone]  Name: Chris [name] MRN: [id number] Date of Birth: 12-13-1946

## 2018-04-15 NOTE — Patient Instructions (Addendum)
Double vision HEP:  Perform at least 3 times per day. Stop if your eye becomes fatigued or hurts and try again later.  1. Hold a small object/card in front of you.  Hold it in the middle at arm's length away.    2. Cover your LEFT eye and look at the object with your RIGHT eye.  3. Slowly move the object side to side in front of you while continuing to watch it with your RIGHT eye.  4.  Remember to keep your head still and only move your eye.  5.  Repeat 5-10 times.  6.  Then, move object up and down while watching it 5-10 times.  7. Cover your RIGHT eye and look at the object with your LEFT eye while you repeat #1-6 above.  8.  Now, uncover both eyes and try to focus on the object while holding it in the middle.  Try to make it 1 image.    10.  Once you can make the image 1 for at least 30 sec in the middle, repeat #1-6 above with both eyes moving slowly and only in the range that you can keep the image 1.

## 2018-04-22 ENCOUNTER — Ambulatory Visit: Payer: Medicare HMO | Attending: Family Medicine | Admitting: Occupational Therapy

## 2018-04-22 ENCOUNTER — Encounter: Payer: Self-pay | Admitting: Physical Therapy

## 2018-04-22 ENCOUNTER — Ambulatory Visit: Payer: Medicare HMO | Admitting: Physical Therapy

## 2018-04-22 DIAGNOSIS — I10 Essential (primary) hypertension: Secondary | ICD-10-CM | POA: Diagnosis not present

## 2018-04-22 DIAGNOSIS — R2689 Other abnormalities of gait and mobility: Secondary | ICD-10-CM | POA: Insufficient documentation

## 2018-04-22 DIAGNOSIS — R82998 Other abnormal findings in urine: Secondary | ICD-10-CM | POA: Diagnosis not present

## 2018-04-22 DIAGNOSIS — R42 Dizziness and giddiness: Secondary | ICD-10-CM | POA: Diagnosis not present

## 2018-04-22 DIAGNOSIS — R2681 Unsteadiness on feet: Secondary | ICD-10-CM | POA: Diagnosis not present

## 2018-04-22 DIAGNOSIS — R41842 Visuospatial deficit: Secondary | ICD-10-CM | POA: Insufficient documentation

## 2018-04-22 DIAGNOSIS — E7849 Other hyperlipidemia: Secondary | ICD-10-CM | POA: Diagnosis not present

## 2018-04-22 DIAGNOSIS — I69315 Cognitive social or emotional deficit following cerebral infarction: Secondary | ICD-10-CM | POA: Diagnosis not present

## 2018-04-22 DIAGNOSIS — Z125 Encounter for screening for malignant neoplasm of prostate: Secondary | ICD-10-CM | POA: Diagnosis not present

## 2018-04-22 DIAGNOSIS — R7301 Impaired fasting glucose: Secondary | ICD-10-CM | POA: Diagnosis not present

## 2018-04-22 NOTE — Therapy (Signed)
[facility] [address] [place], [place], [address] Phone: [phone]   Fax:  [phone]  Physical Therapy Treatment  Patient Details  Name: Chris [name] MRN: [id number] Date of Birth: 74/09/1946 Referring Provider: Dr. Leonie Man   Encounter Date: 04/22/2018  PT End of Session - 04/22/18 0850    Visit Number  4    Number of Visits  18    Date for PT Re-Evaluation  07/07/18    Authorization Type  A[employer]na Medicare    PT Start Time  0848    PT Stop Time  0928    PT Time Calculation (min)  40 min    Equipment Utilized During Treatment  Gait belt    Activity Tolerance  Patient tolerated treatment well    Behavior During Therapy  [facility] for tasks assessed/performed       Past Medical History:  Diagnosis Date  . Acute CVA (cerebrovascular accident) ([place]) 03/25/2018   "double vision and balance issues" (03/26/2018)  . ADD (attention deficit disorder)   . Arthritis    "thumbs and fingers" (03/26/2018)  . Diverticulosis   . GERD (gastroesophageal reflux disease)   . Heart murmur    "when I was young; not noted since age 61" (03/26/2018)  . History of kidney stones   . Hyperlipidemia   . Hypertension   . IFG (impaired fasting glucose)   . Metabolic syndrome   . OSA on CPAP   . Pneumonia    "a time or 2" (03/26/2018)  . PONV (postoperative nausea and vomiting)   . Shingles ~ 2002  . TIA (transient ischemic attack) 2013    Past Surgical History:  Procedure Laterality Date  . CARPAL TUNNEL RELEASE Right   . CYSTOSCOPY W/ STONE MANIPULATION    . FINGER SURGERY Left    thumb surgery; "took bone out; put tendon in"  . TONSILLECTOMY AND ADENOIDECTOMY      There were no vitals filed for this visit.  Subjective Assessment - 04/22/18 0849    Subjective  No new complaints. Does endorse some lower back pain that is chronic in nature. Reports doing HEP with some aspects getting too easy to do.     Patient is accompained by:   Family member spouse [place]    Patient Stated Goals  Pt's goals for PT are to get back to normal, do things around the house, and drive    Currently in Pain?  Yes    Pain Score  2     Pain Location  Back    Pain Orientation  Lower    Pain Descriptors / Indicators  Aching    Pain Type  Chronic pain    Pain Onset  More than a month ago    Pain Frequency  Intermittent    Aggravating Factors   sleeping positions    Pain Relieving Factors  movements      treatment: Issued the following to HEP today with min guard assist for balance. Cues on form and technique. Perform these in a corner with a chair in front of you for safety: Feet Together (Compliant Surface) Varied Arm Positions - Eyes Closed    Stand on compliant surface: pillow/s with feet together and arms at sides. Close eyes and visualize upright position. Hold_30_ seconds. Repeat _2-3_ times per session. Do 1-2 sessions per day.  Copyright  VHI. All rights reserved.    Feet Apart (Compliant Surface) Head Motion - Eyes Closed    Stand  on compliant surface: pillow/s_ with feet shoulder width apart. Close eyes and move head slowly: 1. Up and down x 10 reps 2. Left and right x 10 reps 3. Up toward right/down toward left (diagonal) x 10 reps 4. Up toward left/down toward right (diagonal) x 10 reps  Do _1-2_ sessions per day.  Copyright  VHI. All rights reserved.    Single Leg - Eyes Open    Holding support, lift right leg while maintaining balance over other leg. Progress to removing hands from support surface for longer periods of time. Hold_20_ seconds. Repeat with standing on other leg. Repeat _2-3_ times on each leg per session. Do _1-2_ sessions per day.  Copyright  VHI. All rights reserved.    Perform these at the counter for balance assist as needed:   Feet Heel-Toe "Tandem"    Holding counter, walk a straight line forward by bringing one foot directly in front of the other, then walk a straight  line backwards by bringing one foot directly behind the other one.   Repeat for 3 laps each way per session. Do _1-2_ sessions per day.    Copyright  VHI. All rights reserved.  Side to Side Head Motion    Hold counter for support: Walking on solid surface, turn head and eyes to left for __2__ steps. Then, turn head and eyes to opposite side for __2__ steps.  Repeat sequence _4_ laps total per session. Do _1-2_ sessions per day.  Copyright  VHI. All rights reserved.     Balance Exercises - 04/22/18 0927      Balance Exercises: Standing   Rockerboard  Anterior/posterior;Lateral;Head turns;EO;EC;30 seconds;10 reps;Other (comment) light fingertip touch to bars      Balance Exercises: Standing   Rebounder Limitations  performed both ways on balance board: EO rocking board with all posture, progressing to EC with rocking the board; holding board steady- EC no head movements, progressing to EO head movements left<>right and up<>down. min guard assist with cues on posture and weight shifting to correct balance losses.         PT Education - 04/22/18 1244    Education provided  Yes    Education Details  consolidated and advanced HEP today as pt reported some tasks "too easy".     Person(s) Educated  Patient;Spouse    Methods  Explanation;Demonstration;Verbal cues;Handout;Tactile cues    Comprehension  Verbalized understanding;Returned demonstration;Verbal cues required;Need further instruction       PT Short Term Goals - 04/08/18 1218      PT SHORT TERM GOAL #1   Title  Pt will be independent with HEP for improved balance and gait.  TARGET 05/08/18    Time  5    Period  Weeks    Status  New    Target Date  05/08/18      PT SHORT TERM GOAL #2   Title  Pt will improve TUG score to less than or equal to 13.5 seconds for decreased fall risk.    Time  5    Period  Weeks    Status  New    Target Date  05/08/18      PT SHORT TERM GOAL #3   Title  Pt will improve Berg score to  at least 51/56 for decreased fall risk.    Time  5    Period  Weeks    Status  New    Target Date  05/08/18      PT SHORT TERM  GOAL #4   Title  Pt will improve Dynamic Gait Index score to at least 13/24 for decreased fall risk.    Time  5    Period  Weeks    Status  New    Target Date  05/08/18      PT SHORT TERM GOAL #5   Title  Pt will negotiate 2 steps no rail, independently for safe entry/exit into home.    Time  5    Period  Weeks    Status  New    Target Date  05/08/18        PT Long Term Goals - 04/08/18 1221      PT LONG TERM GOAL #1   Title  Pt will verbalize understanding of fall prevention in home environment.  TARGET 06/05/18    Time  9    Period  Weeks    Status  New    Target Date  06/05/18      PT LONG TERM GOAL #2   Title  Pt will improve DGI score to at least 19/24 for decreased fall risk.    Time  9    Period  Weeks    Status  New    Target Date  06/05/18      PT LONG TERM GOAL #3   Title  Pt will improve gait velocity to at least 2.62 ft/sec for improved community gait efficiency and safety.    Time  9    Period  Weeks    Status  New    Target Date  06/05/18      PT LONG TERM GOAL #4   Title  Pt will ambulate at least 1000 ft, indoor and outdoor gait, with least restrictive assistive device, modified independently, for improved safety with community gait.    Time  9    Period  Weeks    Status  New    Target Date  06/05/18      PT LONG TERM GOAL #5   Title  Pt will improve FOTO score by at least 20% for pt-reported improved functional mobility at discharge.    Time  9    Period  Weeks    Status  New    Target Date  06/05/18            Plan - 04/22/18 0851    Clinical Impression Statement  Today's skilled session initially focused on advancement of pt's HEP issued at last session as he stated some were "too easy". Min guard assist for safety due to 1st time performing them needed in session, no issues reported in session. Remainder  of session continued to address balance reactions on complaint surfaces without and with vision removed. Pt is progressing well toward goals and should benefit from continued PT to progress toward unmet goals.                      Rehab Potential  Good    PT Frequency  2x / week    PT Duration  -- 9 weeks    PT Treatment/Interventions  ADLs/Self Care Home Management;DME Instruction;Gait training;Stair training;Functional mobility training;Therapeutic activities;Therapeutic exercise;Balance training;Patient/family education;Neuromuscular re-education;Vestibular    PT Next Visit Plan  hip/ankle strategy work, compliant surfaces, gait training without assistive device (work on safe progression at home)    Consulted and Agree with Plan of Care  Patient;Family member/caregiver    Family Member Consulted  wife  Patient will benefit from skilled therapeutic intervention in order to improve the following deficits and impairments:  Abnormal gait, Decreased activity tolerance, Decreased balance, Decreased coordination, Decreased mobility, Difficulty walking, Impaired vision/preception  Visit Diagnosis: Unsteadiness on feet  Other abnormalities of gait and mobility  Dizziness and giddiness     Problem List Patient Active Problem List   Diagnosis Date Noted  . Acute CVA (cerebrovascular accident) ([place]) 03/26/2018  . Obesity (BMI 30.0-34.9) 03/26/2018  . Hyperlipidemia 03/26/2018  . OSA on CPAP 03/26/2018  . Hypertension 03/26/2018  . GERD (gastroesophageal reflux disease) 03/26/2018  . Leukocytosis 03/26/2018  . Acute hyperglycemia 03/26/2018    Willow Ora, PTA, [facility] [address], [place], [place] [address] [phone] 04/22/18, 12:51 PM   Name: AYCEN PORRECA MRN: [id number] Date of Birth: 07/09/1946

## 2018-04-22 NOTE — Patient Instructions (Addendum)
Perform these in a corner with a chair in front of you for safety: Feet Together (Compliant Surface) Varied Arm Positions - Eyes Closed    Stand on compliant surface: pillow/s with feet together and arms at sides. Close eyes and visualize upright position. Hold_30_ seconds. Repeat _2-3_ times per session. Do 1-2 sessions per day.  Copyright  VHI. All rights reserved.    Feet Apart (Compliant Surface) Head Motion - Eyes Closed    Stand on compliant surface: pillow/s_ with feet shoulder width apart. Close eyes and move head slowly: 1. Up and down x 10 reps 2. Left and right x 10 reps 3. Up toward right/down toward left (diagonal) x 10 reps 4. Up toward left/down toward right (diagonal) x 10 reps  Do _1-2_ sessions per day.  Copyright  VHI. All rights reserved.    Single Leg - Eyes Open    Holding support, lift right leg while maintaining balance over other leg. Progress to removing hands from support surface for longer periods of time. Hold_20_ seconds. Repeat with standing on other leg. Repeat _2-3_ times on each leg per session. Do _1-2_ sessions per day.  Copyright  VHI. All rights reserved.    Perform these at the counter for balance assist as needed:   Feet Heel-Toe "Tandem"    Holding counter, walk a straight line forward by bringing one foot directly in front of the other, then walk a straight line backwards by bringing one foot directly behind the other one.   Repeat for 3 laps each way per session. Do _1-2_ sessions per day.    Copyright  VHI. All rights reserved.  Side to Side Head Motion    Hold counter for support: Walking on solid surface, turn head and eyes to left for __2__ steps. Then, turn head and eyes to opposite side for __2__ steps.  Repeat sequence _4_ laps total per session. Do _1-2_ sessions per day.  Copyright  VHI. All rights reserved.

## 2018-04-22 NOTE — Patient Instructions (Signed)

## 2018-04-22 NOTE — Therapy (Signed)
[facility] [address] [place], [place], [address] Phone: [phone]   Fax:  [phone]  Occupational Therapy Treatment  Patient Details  Name: Chris [name] MRN: [id number] Date of Birth: 1946-07-20 Referring Provider: Dr. Leonie Man   Encounter Date: 04/22/2018  OT End of Session - 04/22/18 0934    Visit Number  3    Number of Visits  17    Date for OT Re-Evaluation  06/07/18    Authorization Type  [employer] Medicare    OT Start Time  [phone]    OT Stop Time  1015    OT Time Calculation (min)  41 min       Past Medical History:  Diagnosis Date  . Acute CVA (cerebrovascular accident) ([place]) 03/25/2018   "double vision and balance issues" (03/26/2018)  . ADD (attention deficit disorder)   . Arthritis    "thumbs and fingers" (03/26/2018)  . Diverticulosis   . GERD (gastroesophageal reflux disease)   . Heart murmur    "when I was young; not noted since age 73" (03/26/2018)  . History of kidney stones   . Hyperlipidemia   . Hypertension   . IFG (impaired fasting glucose)   . Metabolic syndrome   . OSA on CPAP   . Pneumonia    "a time or 2" (03/26/2018)  . PONV (postoperative nausea and vomiting)   . Shingles ~ 2002  . TIA (transient ischemic attack) 2013    Past Surgical History:  Procedure Laterality Date  . CARPAL TUNNEL RELEASE Right   . CYSTOSCOPY W/ STONE MANIPULATION    . FINGER SURGERY Left    thumb surgery; "took bone out; put tendon in"  . TONSILLECTOMY AND ADENOIDECTOMY      There were no vitals filed for this visit.  Subjective Assessment - 04/22/18 0934    Subjective   Denies pain    Pertinent History  TIA, HTN, dyslipidemia, glucose intolerance sleep apnea with CPAP    Patient Stated Goals  get back to normal    Currently in Pain?  No/denies             treatment: tabletop scanning number cancellation 1.5 M to for number that repeats in each row, 100% accuracy. Environmental scanning with  cognitive component to locate items in sequential order,  12/13 items located in sequential order on first pass, pt required extra time to locate final item.. Education provided regarding memory compensation, pt verbalized understanding. Therapist checked progress towards goals.                OT Short Term Goals - 04/22/18 1008      OT SHORT TERM GOAL #1   Title  I with HEP.    Status  Achieved      OT SHORT TERM GOAL #2   Title  Pt will verbalize understanding of compensatory strategies for visual deficits.    Status  Achieved      OT SHORT TERM GOAL #3   Title  Pt will perform all basic ADLs modified independently    Status  Achieved      OT SHORT TERM GOAL #4   Title  Pt will report ability to read for 30 mins with minimal reports of diplopia.    Status  Achieved        OT Long Term Goals - 04/08/18 1050      OT LONG TERM GOAL #1   Title  Pt will perform home managment  activities at a modified independent level without LOB.    Time  8    Period  Weeks    Status  New      OT LONG TERM GOAL #2   Title  Pt will perform mod complex environmental scanning activities with 95% accuracy and no LOB.    Time  8    Period  Weeks    Status  New      OT LONG TERM GOAL #3   Title  Pt will verbalize understanding of compensatory strategies for short term memory deficits    Time  8    Period  Weeks    Status  New      OT LONG TERM GOAL #4   Title  Pt will demonstrate ability to perfrom alternating attention task with a visual componant with 90% or better accuracy in prep for driving.    Time  8    Period  Weeks    Status  New            Plan - 04/22/18 1029    Clinical Impression Statement  Pt is progressing towards goals.Pt reports perfroming HEP and pt's visual skills appear to be improving. He is now able to read for greater than 30 mins without diplopia.    Occupational Profile and client history currently impacting functional performance  PMH: TIA,  HTN, hyperlipidemia, OSA on CPAP. 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, currently he requires supervision with ADLs and is unable to drive    Rehab Potential  Good    Current Impairments/barriers affecting progress:  visual perceptual deficits, diplopia, decreased balance, cognitive deficits    OT Frequency  2x / week    OT Duration  8 weeks    OT Treatment/Interventions  Self-care/ADL training;Therapeutic exercise;Visual/perceptual remediation/compensation;Patient/family education;Neuromuscular education;Moist Heat;Energy conservation;Therapist, nutritional;Therapeutic activities;Balance training;Cognitive remediation/compensation;Manual Therapy;DME and/or AE instruction    Plan   dynamic balance activities, visul perceptual skills    Consulted and Agree with Plan of Care  Patient;Family member/caregiver       Patient will benefit from skilled therapeutic intervention in order to improve the following deficits and impairments:  Abnormal gait, Decreased cognition, Decreased knowledge of use of DME, Impaired vision/preception, Decreased mobility, Decreased activity tolerance, Decreased coordination, Impaired perceived functional ability, Difficulty walking, Decreased safety awareness, Decreased knowledge of precautions, Decreased balance  Visit Diagnosis: Visuospatial deficit  Cognitive social or emotional deficit following cerebral infarction  Unsteadiness on feet    Problem List Patient Active Problem List   Diagnosis Date Noted  . Acute CVA (cerebrovascular accident) ([facility]) 03/26/2018  . Obesity (BMI 30.0-34.9) 03/26/2018  . Hyperlipidemia 03/26/2018  . OSA on CPAP 03/26/2018  . Hypertension 03/26/2018  . GERD (gastroesophageal reflux disease) 03/26/2018  . Leukocytosis 03/26/2018  . Acute hyperglycemia 03/26/2018    Dollene Mallery 04/22/2018, 10:31 AM  [facility] [address] [place], [place], [address] Phone: [phone]   Fax:  [phone]  Name: JARRED PURTEE MRN: [id number] Date of Birth: 1946-12-15

## 2018-04-23 ENCOUNTER — Encounter: Payer: Medicare HMO | Admitting: Internal Medicine

## 2018-04-24 ENCOUNTER — Encounter: Payer: Self-pay | Admitting: Physical Therapy

## 2018-04-24 ENCOUNTER — Ambulatory Visit: Payer: Medicare HMO | Admitting: Occupational Therapy

## 2018-04-24 ENCOUNTER — Ambulatory Visit: Payer: Medicare HMO | Admitting: Physical Therapy

## 2018-04-24 DIAGNOSIS — R42 Dizziness and giddiness: Secondary | ICD-10-CM

## 2018-04-24 DIAGNOSIS — R2681 Unsteadiness on feet: Secondary | ICD-10-CM | POA: Diagnosis not present

## 2018-04-24 DIAGNOSIS — R2689 Other abnormalities of gait and mobility: Secondary | ICD-10-CM | POA: Diagnosis not present

## 2018-04-24 DIAGNOSIS — R41842 Visuospatial deficit: Secondary | ICD-10-CM

## 2018-04-24 DIAGNOSIS — I69315 Cognitive social or emotional deficit following cerebral infarction: Secondary | ICD-10-CM | POA: Diagnosis not present

## 2018-04-24 NOTE — Therapy (Signed)
[facility] [address] [place], [place], [address] Phone: [phone]   Fax:  [phone]  Occupational Therapy Treatment  Patient Details  Name: ANTHONEY [name] MRN: [id number] Date of Birth: 1946/11/02 Referring Provider: Dr. Leonie Man   Encounter Date: 04/24/2018  OT End of Session - 04/24/18 0955    Visit Number  4    Number of Visits  17    Date for OT Re-Evaluation  06/07/18    Authorization Type  A[employer]na Medicare    OT Start Time  [phone]    OT Stop Time  1016    OT Time Calculation (min)  38 min    Activity Tolerance  Patient tolerated treatment well    Behavior During Therapy  [facility] for tasks assessed/performed       Past Medical History:  Diagnosis Date  . Acute CVA (cerebrovascular accident) ([facility]) 03/25/2018   "double vision and balance issues" (03/26/2018)  . ADD (attention deficit disorder)   . Arthritis    "thumbs and fingers" (03/26/2018)  . Diverticulosis   . GERD (gastroesophageal reflux disease)   . Heart murmur    "when I was young; not noted since age 46" (03/26/2018)  . History of kidney stones   . Hyperlipidemia   . Hypertension   . IFG (impaired fasting glucose)   . Metabolic syndrome   . OSA on CPAP   . Pneumonia    "a time or 2" (03/26/2018)  . PONV (postoperative nausea and vomiting)   . Shingles ~ 2002  . TIA (transient ischemic attack) 2013    Past Surgical History:  Procedure Laterality Date  . CARPAL TUNNEL RELEASE Right   . CYSTOSCOPY W/ STONE MANIPULATION    . FINGER SURGERY Left    thumb surgery; "took bone out; put tendon in"  . TONSILLECTOMY AND ADENOIDECTOMY      There were no vitals filed for this visit.  Subjective Assessment - 04/24/18 0938    Subjective   Denies pain    Pertinent History  TIA, HTN, dyslipidemia, glucose intolerance sleep apnea with CPAP    Currently in Pain?  No/denies          Treatment: Environmental scanning without using walker, close  supervision, 1 LOB retrieving a low item, minguard provided. Pt missed 1 items of first pass, minimal reports of dizziness. Computer activities for visual scanning, reaction time to a moving target, then activities for alternating attention, memory with a cognitive component, min v.c Pt was provided with information regarding constant therapy. Tabletop scanning number cancellation that repeats, with a cognitive component, increased time, 1 error                   OT Short Term Goals - 04/22/18 1008      OT SHORT TERM GOAL #1   Title  I with HEP.    Status  Achieved      OT SHORT TERM GOAL #2   Title  Pt will verbalize understanding of compensatory strategies for visual deficits.    Status  Achieved      OT SHORT TERM GOAL #3   Title  Pt will perform all basic ADLs modified independently    Status  Achieved      OT SHORT TERM GOAL #4   Title  Pt will report ability to read for 30 mins with minimal reports of diplopia.    Status  Achieved        OT Long Term  Goals - 04/24/18 0941      OT LONG TERM GOAL #1   Title  Pt will perform home managment activities at a modified independent level without LOB.    Time  8    Period  Weeks    Status  New      OT LONG TERM GOAL #2   Title  Pt will perform mod complex environmental scanning activities with 95% accuracy and no LOB.    Time  8    Period  Weeks    Status  New      OT LONG TERM GOAL #3   Title  Pt will verbalize understanding of compensatory strategies for short term memory deficits    Time  8    Period  Weeks    Status  New      OT LONG TERM GOAL #4   Title  Pt will demonstrate ability to perfrom alternating attention task with a visual componant with 90% or better accuracy in prep for driving.    Time  8    Period  Weeks    Status  New            Plan - 04/24/18 0955    Clinical Impression Statement  Pt is progressing towards goals. He demonstrates improved visual skills and balance. Pt  reports spreading fertilizer in yard yesterday without difficulty.    Occupational Profile and client history currently impacting functional performance  PMH: TIA, HTN, hyperlipidemia, OSA on CPAP. 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, currently he requires supervision with ADLs and is unable to drive    Occupational performance deficits (Please refer to evaluation for details):  ADL's;IADL's;Play;Leisure;Social Participation    Rehab Potential  Good    Current Impairments/barriers affecting progress:  visual perceptual deficits, diplopia, decreased balance, cognitive deficits    OT Frequency  2x / week    OT Duration  8 weeks    OT Treatment/Interventions  Self-care/ADL training;Therapeutic exercise;Visual/perceptual remediation/compensation;Patient/family education;Neuromuscular education;Moist Heat;Energy conservation;Therapist, nutritional;Therapeutic activities;Balance training;Cognitive remediation/compensation;Manual Therapy;DME and/or AE instruction    Plan   dynamic balance activities, visual perceptual skills    Consulted and Agree with Plan of Care  Patient;Family member/caregiver       Patient will benefit from skilled therapeutic intervention in order to improve the following deficits and impairments:  Abnormal gait, Decreased cognition, Decreased knowledge of use of DME, Impaired vision/preception, Decreased mobility, Decreased activity tolerance, Decreased coordination, Impaired perceived functional ability, Difficulty walking, Decreased safety awareness, Decreased knowledge of precautions, Decreased balance  Visit Diagnosis: Visuospatial deficit  Cognitive social or emotional deficit following cerebral infarction    Problem List Patient Active Problem List   Diagnosis Date Noted  . Acute CVA (cerebrovascular accident) ([facility]) 03/26/2018  . Obesity (BMI 30.0-34.9) 03/26/2018  . Hyperlipidemia 03/26/2018  . OSA on CPAP  03/26/2018  . Hypertension 03/26/2018  . GERD (gastroesophageal reflux disease) 03/26/2018  . Leukocytosis 03/26/2018  . Acute hyperglycemia 03/26/2018    Chris [name] 04/24/2018, 10:05 AM  [facility] [address] [place], [place], [address] Phone: [phone]   Fax:  [phone]  Name: Chris [name] MRN: [id number] Date of Birth: [birth year]

## 2018-04-24 NOTE — Therapy (Signed)
[facility] [address] [place], [place], [address] Phone: [phone]   Fax:  [phone]  Physical Therapy Treatment  Patient Details  Name: Chris [name] MRN: [id number] Date of Birth: 05/25/1946 Referring Provider: Dr. Leonie Man   Encounter Date: 04/24/2018  PT End of Session - 04/24/18 0852    Visit Number  5    Number of Visits  18    Date for PT Re-Evaluation  07/07/18    Authorization Type  Aetna Medicare    PT Start Time  [phone]    PT Stop Time  0930    PT Time Calculation (min)  41 min    Equipment Utilized During Treatment  Gait belt    Activity Tolerance  Patient tolerated treatment well    Behavior During Therapy  [facility] for tasks assessed/performed       Past Medical History:  Diagnosis Date  . Acute CVA (cerebrovascular accident) ([place] [facility]) 03/25/2018   "double vision and balance issues" (03/26/2018)  . ADD (attention deficit disorder)   . Arthritis    "thumbs and fingers" (03/26/2018)  . Diverticulosis   . GERD (gastroesophageal reflux disease)   . Heart murmur    "when I was young; not noted since age 72" (03/26/2018)  . History of kidney stones   . Hyperlipidemia   . Hypertension   . IFG (impaired fasting glucose)   . Metabolic syndrome   . OSA on CPAP   . Pneumonia    "a time or 2" (03/26/2018)  . PONV (postoperative nausea and vomiting)   . Shingles ~ 2002  . TIA (transient ischemic attack) 2013    Past Surgical History:  Procedure Laterality Date  . CARPAL TUNNEL RELEASE Right   . CYSTOSCOPY W/ STONE MANIPULATION    . FINGER SURGERY Left    thumb surgery; "took bone out; put tendon in"  . TONSILLECTOMY AND ADENOIDECTOMY      There were no vitals filed for this visit.  Subjective Assessment - 04/24/18 0851    Subjective  No new complaints. HEP is still challenging since being updated last session. No falls.     Patient Stated Goals  Pt's goals for PT are to get back to normal, do things around  the house, and drive    Currently in Pain?  Yes    Pain Score  2     Pain Location  Back    Pain Orientation  Lower butt and back of legs    Pain Descriptors / Indicators  Aching    Pain Type  Chronic pain    Pain Onset  More than a month ago    Pain Frequency  Intermittent    Aggravating Factors   sleeping positions    Pain Relieving Factors  movements during the day          [facility] Adult PT Treatment/Exercise - 04/24/18 0853      Transfers   Transfers  Sit to Stand;Stand to Sit    Sit to Stand  5: Supervision;Without upper extremity assist;From chair/3-in-1    Stand to Sit  5: Supervision;Without upper extremity assist;To chair/3-in-1      Ambulation/Gait   Ambulation/Gait  Yes    Ambulation/Gait Assistance  4: Min guard    Ambulation Distance (Feet)  630 Feet    Assistive device  None    Gait Pattern  Step-through pattern;Decreased arm swing - right;Decreased arm swing - left;Decreased step length - right;Decreased step length - left;Wide base  of support    Ambulation Surface  Level;Unlevel;Indoor;Outdoor;Paved;Gravel;Grass      High Level Balance   High Level Balance Activities  Marching forwards;Marching backwards;Tandem walking tandem/toe/heel walk fwd/bwd    High Level Balance Comments  on both red mats next to counter with intermittent touch to counter for balance: 3 laps each with min guard to min assist for balance. cues on posture, weight shifting and ex form.           Balance Exercises - 04/24/18 0923      Balance Exercises: Standing   SLS with Vectors  Foam/compliant surface;Other reps (comment);Limitations    Rockerboard  Anterior/posterior;Lateral;Head turns;EO;EC;30 seconds;10 reps    Other Standing Exercises  seated with feet across red beam: sit<>stands x10 reps no UE support, cues for full standing and slow, controlled descent. min guard assist for balance.       Balance Exercises: Standing   SLS with Vectors Limitations  6 cones along edges of both  red mats: alternating toe taps to each with side stepping left<>right x 1 lap each way, then alternating double toe taps to each with side stepping left<>right x 1 lap each. up to min assist for balance with cues on stance at cones and weight shifting .     Rebounder Limitations  performed both ways on balance board: EO rocking board with all posture, progressing to EC with rocking the board; holding board steady- EC no head movements, progressing to EC head movements left<>right and up<>down. min guard assist with cues on posture and weight shifting to correct balance losses.         PT Education - 04/24/18 0930    Education provided  Yes    Education Details  walking program    Person(s) Educated  Patient    Methods  Explanation;Demonstration;Verbal cues;Handout    Comprehension  Verbalized understanding;Returned demonstration       PT Short Term Goals - 04/08/18 1218      PT SHORT TERM GOAL #1   Title  Pt will be independent with HEP for improved balance and gait.  TARGET 05/08/18    Time  5    Period  Weeks    Status  New    Target Date  05/08/18      PT SHORT TERM GOAL #2   Title  Pt will improve TUG score to less than or equal to 13.5 seconds for decreased fall risk.    Time  5    Period  Weeks    Status  New    Target Date  05/08/18      PT SHORT TERM GOAL #3   Title  Pt will improve Berg score to at least 51/56 for decreased fall risk.    Time  5    Period  Weeks    Status  New    Target Date  05/08/18      PT SHORT TERM GOAL #4   Title  Pt will improve Dynamic Gait Index score to at least 13/24 for decreased fall risk.    Time  5    Period  Weeks    Status  New    Target Date  05/08/18      PT SHORT TERM GOAL #5   Title  Pt will negotiate 2 steps no rail, independently for safe entry/exit into home.    Time  5    Period  Weeks    Status  New    Target Date  05/08/18        PT Long Term Goals - 04/08/18 1221      PT LONG TERM GOAL #1   Title  Pt will  verbalize understanding of fall prevention in home environment.  TARGET 06/05/18    Time  9    Period  Weeks    Status  New    Target Date  06/05/18      PT LONG TERM GOAL #2   Title  Pt will improve DGI score to at least 19/24 for decreased fall risk.    Time  9    Period  Weeks    Status  New    Target Date  06/05/18      PT LONG TERM GOAL #3   Title  Pt will improve gait velocity to at least 2.62 ft/sec for improved community gait efficiency and safety.    Time  9    Period  Weeks    Status  New    Target Date  06/05/18      PT LONG TERM GOAL #4   Title  Pt will ambulate at least 1000 ft, indoor and outdoor gait, with least restrictive assistive device, modified independently, for improved safety with community gait.    Time  9    Period  Weeks    Status  New    Target Date  06/05/18      PT LONG TERM GOAL #5   Title  Pt will improve FOTO score by at least 20% for pt-reported improved functional mobility at discharge.    Time  9    Period  Weeks    Status  New    Target Date  06/05/18            Plan - 04/24/18 6659    Clinical Impression Statement  Today's skilled session continued to focus on high level balance activities and gait on various surfaces without an AD. Pt now safe to ambulate in home and short, familiar community distances without cane. Advised pt to use walking stick with walking program (HEP) for balance assist and to decrease fatigue. Pt is to continue to use RW with longer distances, when fatigue or going to an unfamiliar place. Pt is progressing toward goals and should benefit from continued PT to progress toward unmet goals.                                     Rehab Potential  Good    PT Frequency  2x / week    PT Duration  -- 9 weeks    PT Treatment/Interventions  ADLs/Self Care Home Management;DME Instruction;Gait training;Stair training;Functional mobility training;Therapeutic activities;Therapeutic exercise;Balance training;Patient/family  education;Neuromuscular re-education;Vestibular    PT Next Visit Plan  hip/ankle strategy work, compliant surfaces, gait training without assistive device for longer distances and uneven/compliant surfaces    Consulted and Agree with Plan of Care  Patient;Family member/caregiver    Family Member Consulted  wife       Patient will benefit from skilled therapeutic intervention in order to improve the following deficits and impairments:  Abnormal gait, Decreased activity tolerance, Decreased balance, Decreased coordination, Decreased mobility, Difficulty walking, Impaired vision/preception  Visit Diagnosis: Unsteadiness on feet  Other abnormalities of gait and mobility  Dizziness and giddiness     Problem List Patient Active Problem List   Diagnosis Date Noted  . Acute CVA (cerebrovascular accident) (  [place]) 03/26/2018  . Obesity (BMI 30.0-34.9) 03/26/2018  . Hyperlipidemia 03/26/2018  . OSA on CPAP 03/26/2018  . Hypertension 03/26/2018  . GERD (gastroesophageal reflux disease) 03/26/2018  . Leukocytosis 03/26/2018  . Acute hyperglycemia 03/26/2018    Willow Ora, PTA, [facility] [address] [place], [place] [address] [phone] 04/24/18, 4:53 PM   Name: MRK BUZBY MRN: [id number] Date of Birth: May 07, 1946

## 2018-04-24 NOTE — Patient Instructions (Signed)
WALKING  Walking is a great form of exercise to increase your strength, endurance and overall fitness.  A walking program can help you start slowly and gradually build endurance as you go.  Everyone's ability is different, so each person's starting point will be different.  You do not have to follow them exactly.  The are just samples. You should simply find out what's right for you and stick to that program.   In the beginning, you'll start off walking 2-3 times a day for short distances.  As you get stronger, you'll be walking further at just 1-2 times per day.  A. You Can Walk For A Certain Length Of Time Each Day    Walk 10 minutes 2 times per day.  Increase 2-3 minutes every 3-4 days (2 times per day).  Work up to 25-30 minutes (1-2 times per day).

## 2018-04-27 ENCOUNTER — Ambulatory Visit: Payer: Medicare HMO | Admitting: Adult Health

## 2018-04-27 ENCOUNTER — Encounter: Payer: Self-pay | Admitting: Adult Health

## 2018-04-27 ENCOUNTER — Telehealth: Payer: Self-pay | Admitting: Adult Health

## 2018-04-27 VITALS — BP 126/73 | HR 68 | Ht 70.0 in | Wt 209.8 lb

## 2018-04-27 DIAGNOSIS — M5441 Lumbago with sciatica, right side: Secondary | ICD-10-CM

## 2018-04-27 DIAGNOSIS — I10 Essential (primary) hypertension: Secondary | ICD-10-CM

## 2018-04-27 DIAGNOSIS — G8929 Other chronic pain: Secondary | ICD-10-CM

## 2018-04-27 DIAGNOSIS — G4733 Obstructive sleep apnea (adult) (pediatric): Secondary | ICD-10-CM | POA: Diagnosis not present

## 2018-04-27 DIAGNOSIS — M5442 Lumbago with sciatica, left side: Secondary | ICD-10-CM

## 2018-04-27 DIAGNOSIS — Z9989 Dependence on other enabling machines and devices: Secondary | ICD-10-CM

## 2018-04-27 DIAGNOSIS — I63512 Cerebral infarction due to unspecified occlusion or stenosis of left middle cerebral artery: Secondary | ICD-10-CM

## 2018-04-27 DIAGNOSIS — E785 Hyperlipidemia, unspecified: Secondary | ICD-10-CM

## 2018-04-27 MED ORDER — GABAPENTIN 300 MG PO CAPS: 300.0000 mg | ORAL_CAPSULE | Freq: Two times a day (BID) | ORAL | 3 refills | Status: DC

## 2018-04-27 NOTE — Progress Notes (Addendum)
[facility] [address] [place]. [place] [address] [phone]       OFFICE FOLLOW UP NOTE  Chris [name] Date of Birth:  May 23, 1946 Medical Record Number:  [id number]   Reason for Referral:  hospital stroke follow up  CHIEF COMPLAINT:  Chief Complaint  Patient presents with  . New Patient (Initial Visit)    stroke follow up.    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 History obtained from patient and chart review. Reviewed all radiology images and labs personally.   Chris [name] is a 72 year old male with PMH of HTN, HLD, glucose intolerance, OSA on CPAP, history of TIA who presented with dizziness upon waking and had difficulty walking.  CT scan reviewed and showed no acute stroke or abnormality.  MRI brain reviewed and showed punctate left parietal WM infarct.  Due to his symptoms, this was likely due to a small brainstem infarct that was not visualized on MRI with etiology likely small vessel disease.  CTA head and CTA neck showed no significant stenosis, occlusion or dissection.  2D echo was unremarkable.  LDL 45 and A1c 6.1.  Patient was previously on aspirin 81 mg and recommended to start Plavix.  Patient was previously on Lipitor 40 mg and this was increased to 80 mg.  Therapy recommended outpatient PT.  Patient discharged in stable condition.   Since discharge, patient has been doing well.  He continues to improve as far as his balance, diplopia and vertigo are concerned.  He continues PT and OT at our neuro rehabilitation center.  He does state that he was having blurry and double vision up to one year prior to his stroke for which he did see ophthalmologist for.  These vision difficulties come and go and increased when he is fatigued.  His vision did worsen slightly with the stroke but is currently back to his  baseline vision prior to the stroke.  He is still able to see and read, but just feels as though his vision is off.  He was referred to an neuro-ophthalmologist Dr. Hassell Done by PCP Dr. Brigitte Pulse and will be seeing him in August.  He does continue to take his Plavix without side effects of bleeding or bruising.  Continues to take Lipitor without side effects of myalgias.  He is compliant with his CPAP at night but has not been reevaluated by sleep specialist since moving to this area and 2006.  Blood pressure satisfactory at 126/73.  He does have complaints of back pain which he has experienced increased pain for approximately 6 months now after "twisting wrong".  His back pain is worsened when he wakes up in the morning and in the evening when he has increased fatigue.  He does have pain that radiates down his back and into the back of his thighs and this will be relieved when he sits but states if he sits for too long this pain worsens.  He was seeing a chiropractor that was helping but now he feels as though is not been getting better.  Denies bowel or bladder incontinence or issues and denies numbness or tingling.  No imaging studies have been done.  Patient also has concerns of occasional palpitations while he is sitting without recent activity.  Denies increased dizziness, lightheadedness or diaphoresis.  Denies chest pain or any other type of  pain during this time.  Patient wondering if he is able to start driving at this time as he has been driving since his stroke.  Denies new or worsening stroke/TIA symptoms.    ROS:   14 system review of systems performed and negative with exception of ringing in ears, and vitamins allergies, apnea and back pain  PMH:  Past Medical History:  Diagnosis Date  . Acute CVA (cerebrovascular accident) ([place]) 03/25/2018   "double vision and balance issues" (03/26/2018)  . ADD (attention deficit disorder)   . Arthritis    "thumbs and fingers" (03/26/2018)  . Diverticulosis   .  GERD (gastroesophageal reflux disease)   . Heart murmur    "when I was young; not noted since age 22" (03/26/2018)  . History of kidney stones   . Hyperlipidemia   . Hypertension   . IFG (impaired fasting glucose)   . Metabolic syndrome   . OSA on CPAP   . Pneumonia    "a time or 2" (03/26/2018)  . PONV (postoperative nausea and vomiting)   . Shingles ~ 2002  . TIA (transient ischemic attack) 2013    PSH:  Past Surgical History:  Procedure Laterality Date  . CARPAL TUNNEL RELEASE Right   . CYSTOSCOPY W/ STONE MANIPULATION    . FINGER SURGERY Left    thumb surgery; "took bone out; put tendon in"  . TONSILLECTOMY AND ADENOIDECTOMY      Social History:  Social History   Socioeconomic History  . Marital status: Married    Spouse name: Not on file  . Number of children: Not on file  . Years of education: Not on file  . Highest education level: Not on file  Occupational History  . Not on file  Social Needs  . Financial resource strain: Not on file  . Food insecurity:    Worry: Not on file    Inability: Not on file  . Transportation needs:    Medical: Not on file    Non-medical: Not on file  Tobacco Use  . Smoking status: Never Smoker  . Smokeless tobacco: Never Used  Substance and Sexual Activity  . Alcohol use: No    Frequency: Never  . Drug use: Never  . Sexual activity: Not on file  Lifestyle  . Physical activity:    Days per week: Not on file    Minutes per session: Not on file  . Stress: Not on file  Relationships  . Social connections:    Talks on phone: Not on file    Gets together: Not on file    Attends religious service: Not on file    Active member of club or organization: Not on file    Attends meetings of clubs or organizations: Not on file    Relationship status: Not on file  . Intimate partner violence:    Fear of current or ex partner: Not on file    Emotionally abused: Not on file    Physically abused: Not on file    Forced sexual activity:  Not on file  Other Topics Concern  . Not on file  Social History Narrative  . Not on file    Family History:  Family History  Problem Relation Age of Onset  . Prostate cancer Father     Medications:   Current Outpatient Medications on File Prior to Visit  Medication Sig Dispense Refill  . atorvastatin (LIPITOR) 40 MG tablet Take 40 mg by mouth daily.    Marland Kitchen  clopidogrel (PLAVIX) 75 MG tablet Take 1 tablet (75 mg total) by mouth daily. 30 tablet 0  . fluticasone (FLONASE) 50 MCG/ACT nasal spray Place 1 spray into both nostrils daily as needed for allergies.     . pantoprazole (PROTONIX) 40 MG tablet Take 40 mg by mouth daily.    Marland Kitchen telmisartan (MICARDIS) 80 MG tablet Take 80 mg by mouth daily.     No current facility-administered medications on file prior to visit.     Allergies:   Allergies  Allergen Reactions  . Morphine And Related Nausea And Vomiting     Physical Exam  Vitals:   04/27/18 0930  BP: 126/73  Pulse: 68  Weight: 209 lb 12.8 oz (95.2 kg)  Height: 5\' 10"  (1.778 m)   Body mass index is 30.1 kg/m. No exam data present  General: well developed, pleasant elderly Caucasian male, well nourished, seated, in no evident distress Head: head normocephalic and atraumatic.   Neck: supple with no carotid or supraclavicular bruits Cardiovascular: regular rate and rhythm, no murmurs Musculoskeletal: no deformity Skin:  no rash/petichiae Vascular:  Normal pulses all extremities  Neurologic Exam Mental Status: Awake and fully alert. Oriented to place and time. Recent and remote memory intact. Attention span, concentration and fund of knowledge appropriate. Mood and affect appropriate.  Cranial Nerves: Fundoscopic exam reveals sharp disc margins. Pupils equal, briskly reactive to light. Extraocular movements full without nystagmus. Visual fields full to confrontation. Hearing intact. Facial sensation intact. Face, tongue, palate moves normally and symmetrically.  Motor:  Normal bulk and tone. Normal strength in all tested extremity muscles. Sensory.: intact to touch , pinprick , position and vibratory sensation.  Coordination: Rapid alternating movements normal in all extremities. Finger-to-nose and heel-to-shin performed accurately bilaterally. Gait and Station: Arises from chair without difficulty. Stance is normal. Gait demonstrates normal stride length and balance . Able to heel, and toe without difficulty.  Mild difficulty with tandem gait. Reflexes: 1+ and symmetric. Toes downgoing.    NIHSS  0 Modified Rankin  2    Diagnostic Data (Labs, Imaging, Testing)  CT HEAD WO CONTRAST 03/25/18 IMPRESSION: 1. No CT evidence for acute intracranial abnormality. 2. Mild atrophy.  MR BRAIN WO CONTRAST 03/26/18 IMPRESSION: Punctate focus of acute ischemia within the left parietal white matter, without hemorrhage or mass effect. Otherwise normal MRI of the brain.  CT ANGIO HEAD W OR WO CONTRAST CT ANGIO NECK W OR WO CONTRAST 03/26/18 MPRESSION: No intracranial or extracranial stenosis, occlusion, or dissection.  ECHOCARDIOGRAM COMPLETE 03/26/18 Impressions: - Normal LV size with mild LV hypertrophy. EF 55-60%. Normal RV   size and systolic function. Mild aortic insufficiency.    ASSESSMENT: Chris [name] is a 72 y.o. 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 Vascular risk factors include HTN, HLD, and OSA.     PLAN: -Continue clopidogrel 75 mg daily  and lipitor  for secondary stroke prevention -F/u with PCP regarding your HLD and HTN management -continue to monitor BP at home -Referral to [facility] for OSA management -CT lumbar spine as patient has been undergoing conservative therapy without improvement - will send results to PCP once available -Gabapentin 300 mg twice daily for sciatica/nerve pain associated to back  pain -recommended patient f/u with PCP regarding palpitations and possible need for referral to cardiologist. At todays visit, rate and rhythm regular. Patient has appointment with PCP Wednesday -  patient will involve PCP in this decision -Maintain strict control of hypertension with blood pressure goal below 130/90, diabetes with hemoglobin A1c goal below 6.5% and cholesterol with LDL cholesterol (bad cholesterol) goal below 70 mg/dL. I also advised the patient to eat a healthy diet with plenty of whole grains, cereals, fruits and vegetables, exercise regularly and maintain ideal body weight.  Follow up in 3 months or call earlier if needed   Greater than 50% time during this 25 minute consultation visit was spent on counseling and coordination of care about HLD, HTN and OSA, discussion about risk benefit of anticoagulation and answering questions.     Venancio Poisson, AGNP-BC  [facility] [address] [place], [place] [address]  Phone [phone] Fax [phone]

## 2018-04-27 NOTE — Patient Instructions (Signed)
Continue clopidogrel 75 mg daily  and lipitor  for secondary stroke prevention  Continue to follow up with PCP regarding hypertension and cholesterol  Referral to to [place] [facility] for OSA management  CT lumbar spine - back pain  Gabapentin 300mg  twice a day for nerve pain   Maintain strict control of hypertension with blood pressure goal below 130/90, diabetes with hemoglobin A1c goal below 6.5% and cholesterol with LDL cholesterol (bad cholesterol) goal below 70 mg/dL. I also advised the patient to eat a healthy diet with plenty of whole grains, cereals, fruits and vegetables, exercise regularly and maintain ideal body weight.  Followup in the future with me in 3 months

## 2018-04-27 NOTE — Telephone Encounter (Signed)
[employer] medicare order sent to [facility]. They will obtain the auth and will reach out to the pt to schedule.  °

## 2018-04-27 NOTE — Progress Notes (Signed)
I agree with the above plan 

## 2018-04-28 ENCOUNTER — Telehealth: Payer: Self-pay | Admitting: Adult Health

## 2018-04-28 ENCOUNTER — Encounter: Payer: Self-pay | Admitting: Physical Therapy

## 2018-04-28 ENCOUNTER — Ambulatory Visit: Payer: Medicare HMO | Admitting: Physical Therapy

## 2018-04-28 ENCOUNTER — Ambulatory Visit: Payer: Medicare HMO | Admitting: Occupational Therapy

## 2018-04-28 DIAGNOSIS — R2681 Unsteadiness on feet: Secondary | ICD-10-CM

## 2018-04-28 DIAGNOSIS — R42 Dizziness and giddiness: Secondary | ICD-10-CM | POA: Diagnosis not present

## 2018-04-28 DIAGNOSIS — I69315 Cognitive social or emotional deficit following cerebral infarction: Secondary | ICD-10-CM

## 2018-04-28 DIAGNOSIS — R2689 Other abnormalities of gait and mobility: Secondary | ICD-10-CM

## 2018-04-28 DIAGNOSIS — R41842 Visuospatial deficit: Secondary | ICD-10-CM | POA: Diagnosis not present

## 2018-04-28 NOTE — Telephone Encounter (Signed)
Miriam with [facility] informed me that due to the diagnosis the CT should be switched to a CT Lumbar spine wo contrast.

## 2018-04-28 NOTE — Addendum Note (Signed)
Addended by: Venancio Poisson on: 04/28/2018 11:42 AM   Modules accepted: Orders

## 2018-04-28 NOTE — Telephone Encounter (Signed)
Noted, thank you

## 2018-04-28 NOTE — Telephone Encounter (Signed)
Just changed the order. Thank you.

## 2018-04-28 NOTE — Therapy (Signed)
[facility] [address] [place], [place], [address] Phone: [phone]   Fax:  [phone]  Physical Therapy Treatment  Patient Details  Name: Chris [name] MRN: [id number] Date of Birth: 29-Jun-1946 Referring Provider: Dr. Leonie Man   Encounter Date: 04/28/2018  PT End of Session - 04/28/18 2056    Visit Number  6    Number of Visits  18    Date for PT Re-Evaluation  07/07/18    Authorization Type  [employer] Medicare    PT Start Time  [phone]    PT Stop Time  1017    PT Time Calculation (min)  44 min    Equipment Utilized During Treatment  Gait belt    Activity Tolerance  Patient tolerated treatment well    Behavior During Therapy  [facility] for tasks assessed/performed       Past Medical History:  Diagnosis Date  . Acute CVA (cerebrovascular accident) ([place]) 03/25/2018   "double vision and balance issues" (03/26/2018)  . ADD (attention deficit disorder)   . Arthritis    "thumbs and fingers" (03/26/2018)  . Diverticulosis   . GERD (gastroesophageal reflux disease)   . Heart murmur    "when I was young; not noted since age 40" (03/26/2018)  . History of kidney stones   . Hyperlipidemia   . Hypertension   . IFG (impaired fasting glucose)   . Metabolic syndrome   . OSA on CPAP   . Pneumonia    "a time or 2" (03/26/2018)  . PONV (postoperative nausea and vomiting)   . Shingles ~ 2002  . TIA (transient ischemic attack) 2013    Past Surgical History:  Procedure Laterality Date  . CARPAL TUNNEL RELEASE Right   . CYSTOSCOPY W/ STONE MANIPULATION    . FINGER SURGERY Left    thumb surgery; "took bone out; put tendon in"  . TONSILLECTOMY AND ADENOIDECTOMY      There were no vitals filed for this visit.  Subjective Assessment - 04/28/18 0934    Subjective  Walking about 15 minutes outdoors with walking pole and have tried it by myself.  No difficulty.    Patient Stated Goals  Pt's goals for PT are to get back to normal, do things  around the house, and drive    Currently in Pain?  No/denies    Pain Onset  More than a month ago                       [facility] Adult PT Treatment/Exercise - 04/28/18 0001      Ambulation/Gait   Ambulation/Gait  Yes    Ambulation/Gait Assistance  4: Min guard    Ambulation Distance (Feet)  800 Feet outdoors, 100 ft x 2 indoors    Assistive device  None;Straight cane trial of cane x 230 ft    Gait Pattern  Step-through pattern;Decreased arm swing - right;Decreased arm swing - left;Decreased step length - right;Decreased step length - left;Wide base of support    Ambulation Surface  Level;Indoor;Unlevel;Outdoor;Paved    Gait Comments  Indoor gait trial with single point cane, with initial cues for placement and technique.  Pt feels he'd like to try cane for potential community use.  PT recommended that patient walk into therapy without walker next visit.      High Level Balance   High Level Balance Activities  Marching forwards;Figure 8 turns;Weight-shifting turns on red mat surfaces  Balance Exercises - 04/28/18 0953      Balance Exercises: Standing   SLS with Vectors  Solid surface;Intermittent upper extremity assist Ball rolling forward/back and side to side 10 reps    Rockerboard  Anterior/posterior;Lateral;Head turns;EO;EC;30 seconds;10 reps;Intermittent UE support Ankle/hip strategy, head nods, arm swing    Gait with Head Turns  Forward;Foam/compliant surface;2 reps Red/blue mat surfaces    Tandem Gait  Forward;Retro;Upper extremity support;Foam/compliant surface;2 reps min assist    Retro Gait  Foam/compliant surface;2 reps Red/blue mat forward/back walking    Sidestepping  Upper extremity support;Foam/compliant support;2 reps R and L in parallel bars; 2 reps on red/blue mats          PT Short Term Goals - 04/08/18 1218      PT SHORT TERM GOAL #1   Title  Pt will be independent with HEP for improved balance and gait.  TARGET 05/08/18    Time  5     Period  Weeks    Status  New    Target Date  05/08/18      PT SHORT TERM GOAL #2   Title  Pt will improve TUG score to less than or equal to 13.5 seconds for decreased fall risk.    Time  5    Period  Weeks    Status  New    Target Date  05/08/18      PT SHORT TERM GOAL #3   Title  Pt will improve Berg score to at least 51/56 for decreased fall risk.    Time  5    Period  Weeks    Status  New    Target Date  05/08/18      PT SHORT TERM GOAL #4   Title  Pt will improve Dynamic Gait Index score to at least 13/24 for decreased fall risk.    Time  5    Period  Weeks    Status  New    Target Date  05/08/18      PT SHORT TERM GOAL #5   Title  Pt will negotiate 2 steps no rail, independently for safe entry/exit into home.    Time  5    Period  Weeks    Status  New    Target Date  05/08/18        PT Long Term Goals - 04/08/18 1221      PT LONG TERM GOAL #1   Title  Pt will verbalize understanding of fall prevention in home environment.  TARGET 06/05/18    Time  9    Period  Weeks    Status  New    Target Date  06/05/18      PT LONG TERM GOAL #2   Title  Pt will improve DGI score to at least 19/24 for decreased fall risk.    Time  9    Period  Weeks    Status  New    Target Date  06/05/18      PT LONG TERM GOAL #3   Title  Pt will improve gait velocity to at least 2.62 ft/sec for improved community gait efficiency and safety.    Time  9    Period  Weeks    Status  New    Target Date  06/05/18      PT LONG TERM GOAL #4   Title  Pt will ambulate at least 1000 ft, indoor and outdoor gait, with least restrictive assistive device, modified  independently, for improved safety with community gait.    Time  9    Period  Weeks    Status  New    Target Date  06/05/18      PT LONG TERM GOAL #5   Title  Pt will improve FOTO score by at least 20% for pt-reported improved functional mobility at discharge.    Time  9    Period  Weeks    Status  New    Target Date  06/05/18             Plan - 04/28/18 2057    Clinical Impression Statement  Continued work on compliant surface on inddor and outdoor surfaces.  Pt continues to be slowed and more hesitant on compliant surfaces, but no LOB noted.  He will continue to benefit from skilled PT to address balance and gait.    Rehab Potential  Good    PT Frequency  2x / week    PT Duration  -- 9 weeks    PT Treatment/Interventions  ADLs/Self Care Home Management;DME Instruction;Gait training;Stair training;Functional mobility training;Therapeutic activities;Therapeutic exercise;Balance training;Patient/family education;Neuromuscular re-education;Vestibular    PT Next Visit Plan  Continue compliant surfaces, indoor and outdoor gait training activities; gait training with cane    Consulted and Agree with Plan of Care  Patient;Family member/caregiver    Family Member Consulted  wife       Patient will benefit from skilled therapeutic intervention in order to improve the following deficits and impairments:  Abnormal gait, Decreased activity tolerance, Decreased balance, Decreased coordination, Decreased mobility, Difficulty walking, Impaired vision/preception  Visit Diagnosis: Other abnormalities of gait and mobility  Unsteadiness on feet     Problem List Patient Active Problem List   Diagnosis Date Noted  . Acute CVA (cerebrovascular accident) ([place]) 03/26/2018  . Obesity (BMI 30.0-34.9) 03/26/2018  . Hyperlipidemia 03/26/2018  . OSA on CPAP 03/26/2018  . Hypertension 03/26/2018  . GERD (gastroesophageal reflux disease) 03/26/2018  . Leukocytosis 03/26/2018  . Acute hyperglycemia 03/26/2018    Porcha Deblanc W. 04/28/2018, 9:01 PM  Frazier Butt., PT   [facility] [address] [place], [place], [address] Phone: [phone]   Fax:  [phone]  Name: NASARIO CZERNIAK MRN: [id number] Date of Birth: August 15, 1946

## 2018-04-28 NOTE — Therapy (Signed)
[facility] [address] [place], [place], [address] Phone: [phone]   Fax:  [phone]  Occupational Therapy Treatment  Patient Details  Name: Chris [name] MRN: [id number] Date of Birth: 11-27-1946 Referring Provider: Dr. Leonie Man   Encounter Date: 04/28/2018  OT End of Session - 04/28/18 1418    Visit Number  5    Number of Visits  17    Date for OT Re-Evaluation  06/07/18    Authorization Type  [employer] Medicare    OT Start Time  [phone]    OT Stop Time  0930    OT Time Calculation (min)  39 min    Activity Tolerance  Patient tolerated treatment well    Behavior During Therapy  [facility] for tasks assessed/performed       Past Medical History:  Diagnosis Date  . Acute CVA (cerebrovascular accident) ([place]) 03/25/2018   "double vision and balance issues" (03/26/2018)  . ADD (attention deficit disorder)   . Arthritis    "thumbs and fingers" (03/26/2018)  . Diverticulosis   . GERD (gastroesophageal reflux disease)   . Heart murmur    "when I was young; not noted since age 38" (03/26/2018)  . History of kidney stones   . Hyperlipidemia   . Hypertension   . IFG (impaired fasting glucose)   . Metabolic syndrome   . OSA on CPAP   . Pneumonia    "a time or 2" (03/26/2018)  . PONV (postoperative nausea and vomiting)   . Shingles ~ 2002  . TIA (transient ischemic attack) 2013    Past Surgical History:  Procedure Laterality Date  . CARPAL TUNNEL RELEASE Right   . CYSTOSCOPY W/ STONE MANIPULATION    . FINGER SURGERY Left    thumb surgery; "took bone out; put tendon in"  . TONSILLECTOMY AND ADENOIDECTOMY      There were no vitals filed for this visit.  Subjective Assessment - 04/28/18 1418    Subjective   Pt reports that NP at neurologist cleared him to drive with his wife    Pertinent History  TIA, HTN, dyslipidemia, glucose intolerance sleep apnea with CPAP    Patient Stated Goals  get back to normal    Currently in  Pain?  No/denies               Treatment: standing on non compliant surface, to copy peg design to left side and  With pegs positioned to right side for head turns for increased challenge, no LOB and pt copied design correctly. Environmental  scanning task while ambulating and tossing a ball , pt correctly located 10/13 items first pas, pt required increased time to locate remainder. Visual memory task with 93% accuracy following initial v.c Ambulating while performing category generation, min v.c for generating words, no LOB, however pt did gently bump wall on right side while navigating around someone. He was unharmed.              OT Short Term Goals - 04/22/18 1008      OT SHORT TERM GOAL #1   Title  I with HEP.    Status  Achieved      OT SHORT TERM GOAL #2   Title  Pt will verbalize understanding of compensatory strategies for visual deficits.    Status  Achieved      OT SHORT TERM GOAL #3   Title  Pt will perform all basic ADLs modified independently    Status  Achieved      OT SHORT TERM GOAL #4   Title  Pt will report ability to read for 30 mins with minimal reports of diplopia.    Status  Achieved        OT Long Term Goals - 04/24/18 1022      OT LONG TERM GOAL #1   Title  Pt will perform home managment activities at a modified independent level without LOB.    Time  8    Period  Weeks    Status  Achieved      OT LONG TERM GOAL #2   Title  Pt will perform mod complex environmental scanning activities with 95% accuracy and no LOB.    Time  8    Period  Weeks            Plan - 04/28/18 1419    Clinical Impression Statement  Pt is progressing towards goals. pt reports driving with his wife today with minimal difficulty. Pt repors that NP at [facility] cleared him to drive.    Occupational Profile and client history currently impacting functional performance  PMH: TIA, HTN, hyperlipidemia, OSA on CPAP. Pt is a retired Furniture conservator/restorer, he was completely I and driving prior to CVA, currently he requires supervision with ADLs and is unable to drive    Occupational performance deficits (Please refer to evaluation for details):  ADL's;IADL's;Play;Leisure;Social Participation    Rehab Potential  Good    Current Impairments/barriers affecting progress:  visual perceptual deficits, diplopia, decreased balance, cognitive deficits    OT Frequency  2x / week    OT Duration  8 weeks    OT Treatment/Interventions  Self-care/ADL training;Therapeutic exercise;Visual/perceptual remediation/compensation;Patient/family education;Neuromuscular education;Moist Heat;Energy conservation;Therapist, nutritional;Therapeutic activities;Balance training;Cognitive remediation/compensation;Manual Therapy;DME and/or AE instruction    Plan  continue to address environmental scanning, multi tasking     Consulted and Agree with Plan of Care  Patient       Patient will benefit from skilled therapeutic intervention in order to improve the following deficits and impairments:  Abnormal gait, Decreased cognition, Decreased knowledge of use of DME, Impaired vision/preception, Decreased mobility, Decreased activity tolerance, Decreased coordination, Impaired perceived functional ability, Difficulty walking, Decreased safety awareness, Decreased knowledge of precautions, Decreased balance  Visit Diagnosis: Unsteadiness on feet  Other abnormalities of gait and mobility  Visuospatial deficit  Cognitive social or emotional deficit following cerebral infarction    Problem List Patient Active Problem List   Diagnosis Date Noted  . Acute CVA (cerebrovascular accident) ([place]) 03/26/2018  . Obesity (BMI 30.0-34.9) 03/26/2018  . Hyperlipidemia 03/26/2018  . OSA on CPAP 03/26/2018  . Hypertension 03/26/2018  . GERD (gastroesophageal reflux disease) 03/26/2018  . Leukocytosis 03/26/2018  . Acute hyperglycemia 03/26/2018     Azell Bill 04/28/2018, 2:22 PM Theone Murdoch, OTR/L [phone] Phone: [phone] 2:23 PM 04/28/18 [facility] [address] [place], [place], [address] Phone: [phone]   Fax:  [phone]  Name: SHRIHAAN PORZIO MRN: [id number] Date of Birth: May 31, 1946

## 2018-04-29 DIAGNOSIS — E8881 Metabolic syndrome: Secondary | ICD-10-CM | POA: Diagnosis not present

## 2018-04-29 DIAGNOSIS — I10 Essential (primary) hypertension: Secondary | ICD-10-CM | POA: Diagnosis not present

## 2018-04-29 DIAGNOSIS — G4733 Obstructive sleep apnea (adult) (pediatric): Secondary | ICD-10-CM | POA: Diagnosis not present

## 2018-04-29 DIAGNOSIS — E7849 Other hyperlipidemia: Secondary | ICD-10-CM | POA: Diagnosis not present

## 2018-04-29 DIAGNOSIS — Z8673 Personal history of transient ischemic attack (TIA), and cerebral infarction without residual deficits: Secondary | ICD-10-CM | POA: Diagnosis not present

## 2018-04-29 DIAGNOSIS — R7301 Impaired fasting glucose: Secondary | ICD-10-CM | POA: Diagnosis not present

## 2018-04-29 DIAGNOSIS — Z0000 Encounter for general adult medical examination without abnormal findings: Secondary | ICD-10-CM | POA: Diagnosis not present

## 2018-04-29 DIAGNOSIS — G250 Essential tremor: Secondary | ICD-10-CM | POA: Diagnosis not present

## 2018-04-29 DIAGNOSIS — Z6830 Body mass index (BMI) 30.0-30.9, adult: Secondary | ICD-10-CM | POA: Diagnosis not present

## 2018-04-29 DIAGNOSIS — R270 Ataxia, unspecified: Secondary | ICD-10-CM | POA: Diagnosis not present

## 2018-04-30 ENCOUNTER — Ambulatory Visit: Payer: Medicare HMO | Admitting: Occupational Therapy

## 2018-04-30 ENCOUNTER — Encounter: Payer: Self-pay | Admitting: Physical Therapy

## 2018-04-30 ENCOUNTER — Ambulatory Visit: Payer: Medicare HMO | Admitting: Physical Therapy

## 2018-04-30 DIAGNOSIS — R41842 Visuospatial deficit: Secondary | ICD-10-CM | POA: Diagnosis not present

## 2018-04-30 DIAGNOSIS — R42 Dizziness and giddiness: Secondary | ICD-10-CM

## 2018-04-30 DIAGNOSIS — I69315 Cognitive social or emotional deficit following cerebral infarction: Secondary | ICD-10-CM

## 2018-04-30 DIAGNOSIS — R2681 Unsteadiness on feet: Secondary | ICD-10-CM

## 2018-04-30 DIAGNOSIS — R2689 Other abnormalities of gait and mobility: Secondary | ICD-10-CM

## 2018-04-30 NOTE — Therapy (Signed)
[facility] [address] [place], [place], [address] Phone: [phone]   Fax:  [phone]  Occupational Therapy Treatment  Patient Details  Name: Chris [name] MRN: [id number] Date of Birth: 1946/04/24 Referring Provider: Dr. Leonie Man   Encounter Date: 04/30/2018  OT End of Session - 04/30/18 1456    Visit Number  6    Number of Visits  17    Date for OT Re-Evaluation  06/07/18    Authorization Type  [employer]na Medicare    OT Start Time  1403    OT Stop Time  1445    OT Time Calculation (min)  42 min    Activity Tolerance  Patient tolerated treatment well    Behavior During Therapy  [facility] for tasks assessed/performed       Past Medical History:  Diagnosis Date  . Acute CVA (cerebrovascular accident) ([place]) 03/25/2018   "double vision and balance issues" (03/26/2018)  . ADD (attention deficit disorder)   . Arthritis    "thumbs and fingers" (03/26/2018)  . Diverticulosis   . GERD (gastroesophageal reflux disease)   . Heart murmur    "when I was young; not noted since age 27" (03/26/2018)  . History of kidney stones   . Hyperlipidemia   . Hypertension   . IFG (impaired fasting glucose)   . Metabolic syndrome   . OSA on CPAP   . Pneumonia    "a time or 2" (03/26/2018)  . PONV (postoperative nausea and vomiting)   . Shingles ~ 2002  . TIA (transient ischemic attack) 2013    Past Surgical History:  Procedure Laterality Date  . CARPAL TUNNEL RELEASE Right   . CYSTOSCOPY W/ STONE MANIPULATION    . FINGER SURGERY Left    thumb surgery; "took bone out; put tendon in"  . TONSILLECTOMY AND ADENOIDECTOMY      There were no vitals filed for this visit.  Subjective Assessment - 04/30/18 1405    Pertinent History  TIA, HTN, dyslipidemia, glucose intolerance sleep apnea with CPAP    Patient Stated Goals  get back to normal    Currently in Pain?  Yes  (Pended)     Pain Score  2   (Pended)     Pain Location  Back  (Pended)      Pain Orientation  Lower  (Pended)     Pain Descriptors / Indicators  Aching  (Pended)     Pain Type  Chronic pain  (Pended)     Pain Onset  More than a month ago  (Pended)     Pain Frequency  Intermittent  (Pended)                Treatment Environemental scanning with a cognitive component, 13/14 items located on first pass, with only 1 LOB(pt self recovered) and no AD.  Activities to challenge visual perceptual skills, word search with list of words place a diagonal position requiring head and eye movements, mod v.c then coping a peg design with design to the side requiring head and eye movements min reports of dizziness.              OT Short Term Goals - 04/22/18 1008      OT SHORT TERM GOAL #1   Title  I with HEP.    Status  Achieved      OT SHORT TERM GOAL #2   Title  Pt will verbalize understanding of compensatory strategies for visual deficits.  Status  Achieved      OT SHORT TERM GOAL #3   Title  Pt will perform all basic ADLs modified independently    Status  Achieved      OT SHORT TERM GOAL #4   Title  Pt will report ability to read for 30 mins with minimal reports of diplopia.    Status  Achieved        OT Long Term Goals - 04/30/18 1457      OT LONG TERM GOAL #1   Title  Pt will perform home managment activities at a modified independent level without LOB.    Time  8    Period  Weeks    Status  Achieved      OT LONG TERM GOAL #2   Title  Pt will perform mod complex environmental scanning activities with 95% accuracy and no LOB.    Time  8    Period  Weeks      OT LONG TERM GOAL #3   Title  Pt will verbalize understanding of compensatory strategies for short term memory deficits    Status  On-going      OT LONG TERM GOAL #4   Title  Pt will demonstrate ability to perfrom alternating attention task with a visual componant with 90% or better accuracy in prep for driving.    Status  On-going            Plan - 04/30/18 1457     Clinical Impression Statement  Pt is progressing towards goals. He demonstrates improved environmental scanning and decreased reports of dizziness.    Occupational Profile and client history currently impacting functional performance  PMH: TIA, HTN, hyperlipidemia, OSA on CPAP. 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, currently he requires supervision with ADLs and is unable to drive    Occupational performance deficits (Please refer to evaluation for details):  ADL's;IADL's;Play;Leisure;Social Participation    Rehab Potential  Good    Current Impairments/barriers affecting progress:  visual perceptual deficits, diplopia, decreased balance, cognitive deficits    OT Frequency  2x / week    OT Duration  8 weeks    Plan   check short term goals, possible d/c in next 1-2 visits, continue to address environmental scanning, multi tasking     Consulted and Agree with Plan of Care  Patient       Patient will benefit from skilled therapeutic intervention in order to improve the following deficits and impairments:  Abnormal gait, Decreased cognition, Decreased knowledge of use of DME, Impaired vision/preception, Decreased mobility, Decreased activity tolerance, Decreased coordination, Impaired perceived functional ability, Difficulty walking, Decreased safety awareness, Decreased knowledge of precautions, Decreased balance  Visit Diagnosis: Visuospatial deficit  Cognitive social or emotional deficit following cerebral infarction  Unsteadiness on feet    Problem List Patient Active Problem List   Diagnosis Date Noted  . Acute CVA (cerebrovascular accident) ([place]) 03/26/2018  . Obesity (BMI 30.0-34.9) 03/26/2018  . Hyperlipidemia 03/26/2018  . OSA on CPAP 03/26/2018  . Hypertension 03/26/2018  . GERD (gastroesophageal reflux disease) 03/26/2018  . Leukocytosis 03/26/2018  . Acute hyperglycemia 03/26/2018    RINE,KATHRYN 04/30/2018, 2:59 PM Theone Murdoch, OTR/L [phone] Phone: [phone] 4:51 PM 04/30/18 [facility] [address] [place], [place], [address] Phone: [phone]   Fax:  [phone]  Name: Chris [name] MRN: [id number] Date of Birth: [birth year]

## 2018-05-01 NOTE — Therapy (Signed)
[facility] [address] [place], [place], [address] Phone: [phone]   Fax:  [phone]  Physical Therapy Treatment  Patient Details  Name: Chris [name] MRN: [id number] Date of Birth: 1946-12-10 Referring Provider: Dr. Leonie Man   Encounter Date: 04/30/2018  PT End of Session - 04/30/18 1449    Visit Number  7    Number of Visits  18    Date for PT Re-Evaluation  07/07/18    Authorization Type  A[employer]na Medicare    PT Start Time  9833    PT Stop Time  1529    PT Time Calculation (min)  42 min    Equipment Utilized During Treatment  Gait belt    Activity Tolerance  Patient tolerated treatment well    Behavior During Therapy  [facility] for tasks assessed/performed       Past Medical History:  Diagnosis Date  . Acute CVA (cerebrovascular accident) ([place]) 03/25/2018   "double vision and balance issues" (03/26/2018)  . ADD (attention deficit disorder)   . Arthritis    "thumbs and fingers" (03/26/2018)  . Diverticulosis   . GERD (gastroesophageal reflux disease)   . Heart murmur    "when I was young; not noted since age 68" (03/26/2018)  . History of kidney stones   . Hyperlipidemia   . Hypertension   . IFG (impaired fasting glucose)   . Metabolic syndrome   . OSA on CPAP   . Pneumonia    "a time or 2" (03/26/2018)  . PONV (postoperative nausea and vomiting)   . Shingles ~ 2002  . TIA (transient ischemic attack) 2013    Past Surgical History:  Procedure Laterality Date  . CARPAL TUNNEL RELEASE Right   . CYSTOSCOPY W/ STONE MANIPULATION    . FINGER SURGERY Left    thumb surgery; "took bone out; put tendon in"  . TONSILLECTOMY AND ADENOIDECTOMY      There were no vitals filed for this visit.  Subjective Assessment - 04/30/18 1447    Subjective  No new complaints. Using cane to/from PT today. Reports he feels more stable with using cane.     Patient Stated Goals  Pt's goals for PT are to get back to normal, do things  around the house, and drive    Currently in Pain?  Yes    Pain Score  1     Pain Location  Back    Pain Orientation  Lower    Pain Descriptors / Indicators  Aching    Pain Type  Chronic pain    Pain Radiating Towards  butt into back of both legs    Pain Onset  More than a month ago    Pain Frequency  Intermittent    Aggravating Factors   sleeping positions    Pain Relieving Factors  movements during the day          [facility] Adult PT Treatment/Exercise - 04/30/18 1449      Transfers   Transfers  Sit to Stand;Stand to Sit    Sit to Stand  5: Supervision;Without upper extremity assist;From chair/3-in-1    Stand to Sit  5: Supervision;Without upper extremity assist;To chair/3-in-1      Ambulation/Gait   Ambulation/Gait  Yes    Ambulation/Gait Assistance  5: Supervision;4: Min guard    Ambulation/Gait Assistance Details  minor veering toward right noted at times, no significant loss of balance noted    Ambulation Distance (Feet)  800 Feet  Assistive device  Straight cane    Gait Pattern  Step-through pattern;Decreased arm swing - right;Decreased arm swing - left;Decreased step length - right;Decreased step length - left;Wide base of support    Ambulation Surface  Level;Indoor;Unlevel;Outdoor;Paved;Grass;Other (comment) rubber mulch      High Level Balance   High Level Balance Activities  Marching forwards;Marching backwards;Tandem walking;Braiding;Head turns toe walking    High Level Balance Comments  on red/blue mats next to counter top: high knee marching fwd/bwd, tandem gait fwd/bwd, toe walking fwd/bwd x 3 laps each/each way. on both red mats: fwd/bwd gait with head turns left<>right, then fwd/bwd gait with head turns up<>down, braiding left<>right- all x 3 laps each with min assist for balance. mod assist at times with head movements due to lateral loss of balance (right > left direction);  hurdles of varied heights on both red mats next to counter top: reciprocal stepping x 6 laps  forward with up to min assist for balance with touch to counter as needed for balance assistance; 6 cones along one edge of mat- alternating fwd toe tapping to each cone with side stepping left<>right x 1 lap each way with min guard to min assist for balance, cues on stance position and weight shifting; with cones along both edges of red mats: lateral toe tap to cones with tandem gait down middle x 6 laps total with mod assist downgrading to min assist for balance with cues on posture/weight shifitng.            PT Short Term Goals - 04/08/18 1218      PT SHORT TERM GOAL #1   Title  Pt will be independent with HEP for improved balance and gait.  TARGET 05/08/18    Time  5    Period  Weeks    Status  New    Target Date  05/08/18      PT SHORT TERM GOAL #2   Title  Pt will improve TUG score to less than or equal to 13.5 seconds for decreased fall risk.    Time  5    Period  Weeks    Status  New    Target Date  05/08/18      PT SHORT TERM GOAL #3   Title  Pt will improve Berg score to at least 51/56 for decreased fall risk.    Time  5    Period  Weeks    Status  New    Target Date  05/08/18      PT SHORT TERM GOAL #4   Title  Pt will improve Dynamic Gait Index score to at least 13/24 for decreased fall risk.    Time  5    Period  Weeks    Status  New    Target Date  05/08/18      PT SHORT TERM GOAL #5   Title  Pt will negotiate 2 steps no rail, independently for safe entry/exit into home.    Time  5    Period  Weeks    Status  New    Target Date  05/08/18        PT Long Term Goals - 04/08/18 1221      PT LONG TERM GOAL #1   Title  Pt will verbalize understanding of fall prevention in home environment.  TARGET 06/05/18    Time  9    Period  Weeks    Status  New    Target Date  06/05/18      PT LONG TERM GOAL #2   Title  Pt will improve DGI score to at least 19/24 for decreased fall risk.    Time  9    Period  Weeks    Status  New    Target Date  06/05/18       PT LONG TERM GOAL #3   Title  Pt will improve gait velocity to at least 2.62 ft/sec for improved community gait efficiency and safety.    Time  9    Period  Weeks    Status  New    Target Date  06/05/18      PT LONG TERM GOAL #4   Title  Pt will ambulate at least 1000 ft, indoor and outdoor gait, with least restrictive assistive device, modified independently, for improved safety with community gait.    Time  9    Period  Weeks    Status  New    Target Date  06/05/18      PT LONG TERM GOAL #5   Title  Pt will improve FOTO score by at least 20% for pt-reported improved functional mobility at discharge.    Time  9    Period  Weeks    Status  New    Target Date  06/05/18            Plan - 04/30/18 1449    Clinical Impression Statement  Today's skilled session continued to address gait on various surfaces and high level balance activities. Pt continues to be challenged by compliant surfaces in single leg stance, narrowed base of support and/or vision removed. Pt is progressing toward goals and should benefit from continued PT to progress toward unmet goals.     Rehab Potential  Good    PT Frequency  2x / week    PT Duration  -- 9 weeks    PT Treatment/Interventions  ADLs/Self Care Home Management;DME Instruction;Gait training;Stair training;Functional mobility training;Therapeutic activities;Therapeutic exercise;Balance training;Patient/family education;Neuromuscular re-education;Vestibular    PT Next Visit Plan  Continue compliant surfaces, indoor and outdoor gait training activities; gait training with cane    Consulted and Agree with Plan of Care  Patient;Family member/caregiver    Family Member Consulted  wife       Patient will benefit from skilled therapeutic intervention in order to improve the following deficits and impairments:  Abnormal gait, Decreased activity tolerance, Decreased balance, Decreased coordination, Decreased mobility, Difficulty walking, Impaired  vision/preception  Visit Diagnosis: Unsteadiness on feet  Other abnormalities of gait and mobility  Dizziness and giddiness     Problem List Patient Active Problem List   Diagnosis Date Noted  . Acute CVA (cerebrovascular accident) ([place]) 03/26/2018  . Obesity (BMI 30.0-34.9) 03/26/2018  . Hyperlipidemia 03/26/2018  . OSA on CPAP 03/26/2018  . Hypertension 03/26/2018  . GERD (gastroesophageal reflux disease) 03/26/2018  . Leukocytosis 03/26/2018  . Acute hyperglycemia 03/26/2018    Willow Ora, PTA, [facility] [address], [place], [place] [address] [phone] 05/01/18, 3:29 PM   Name: NASHAWN HILLOCK MRN: [id number] Date of Birth: [birth year]

## 2018-05-05 ENCOUNTER — Ambulatory Visit: Payer: Medicare HMO | Admitting: Occupational Therapy

## 2018-05-05 ENCOUNTER — Ambulatory Visit: Payer: Medicare HMO | Admitting: Physical Therapy

## 2018-05-05 ENCOUNTER — Encounter: Payer: Self-pay | Admitting: Physical Therapy

## 2018-05-05 ENCOUNTER — Encounter: Payer: Self-pay | Admitting: Occupational Therapy

## 2018-05-05 DIAGNOSIS — I69315 Cognitive social or emotional deficit following cerebral infarction: Secondary | ICD-10-CM

## 2018-05-05 DIAGNOSIS — R42 Dizziness and giddiness: Secondary | ICD-10-CM | POA: Diagnosis not present

## 2018-05-05 DIAGNOSIS — R2689 Other abnormalities of gait and mobility: Secondary | ICD-10-CM | POA: Diagnosis not present

## 2018-05-05 DIAGNOSIS — R2681 Unsteadiness on feet: Secondary | ICD-10-CM

## 2018-05-05 DIAGNOSIS — R41842 Visuospatial deficit: Secondary | ICD-10-CM | POA: Diagnosis not present

## 2018-05-05 NOTE — Therapy (Signed)
[facility] [address] [place], [place], [address] Phone: [phone]   Fax:  [phone]  Physical Therapy Treatment  Patient Details  Name: Chris [name] MRN: [id number] Date of Birth: 1946-05-29 Referring Provider: Dr. Leonie Man   Encounter Date: 05/05/2018  PT End of Session - 05/05/18 2114    Visit Number  8    Number of Visits  18    Date for PT Re-Evaluation  07/07/18    Authorization Type  [employer] Medicare    PT Start Time  1316    PT Stop Time  1358    PT Time Calculation (min)  42 min    Equipment Utilized During Treatment  Gait belt    Activity Tolerance  Patient tolerated treatment well    Behavior During Therapy  [facility] for tasks assessed/performed       Past Medical History:  Diagnosis Date  . Acute CVA (cerebrovascular accident) ([place]) 03/25/2018   "double vision and balance issues" (03/26/2018)  . ADD (attention deficit disorder)   . Arthritis    "thumbs and fingers" (03/26/2018)  . Diverticulosis   . GERD (gastroesophageal reflux disease)   . Heart murmur    "when I was young; not noted since age 66" (03/26/2018)  . History of kidney stones   . Hyperlipidemia   . Hypertension   . IFG (impaired fasting glucose)   . Metabolic syndrome   . OSA on CPAP   . Pneumonia    "a time or 2" (03/26/2018)  . PONV (postoperative nausea and vomiting)   . Shingles ~ 2002  . TIA (transient ischemic attack) 2013    Past Surgical History:  Procedure Laterality Date  . CARPAL TUNNEL RELEASE Right   . CYSTOSCOPY W/ STONE MANIPULATION    . FINGER SURGERY Left    thumb surgery; "took bone out; put tendon in"  . TONSILLECTOMY AND ADENOIDECTOMY      There were no vitals filed for this visit.  Subjective Assessment - 05/05/18 1317    Subjective  Walking alot now (with cane); 20-25 minutes twice a day.  That is coming along.    Patient Stated Goals  Pt's goals for PT are to get back to normal, do things around the  house, and drive    Currently in Pain?  No/denies    Pain Onset  More than a month ago         [facility] PT Assessment - 05/05/18 0001      Timed Up and Go Test   TUG  Normal TUG;Cognitive TUG    Normal TUG (seconds)  11.56    Cognitive TUG (seconds)  11.84 unsteadiness on first step      Functional Gait  Assessment   Gait assessed   Yes    Gait Level Surface  Walks 20 ft, slow speed, abnormal gait pattern, evidence for imbalance or deviates 10-15 in outside of the 12 in walkway width. Requires more than 7 sec to ambulate 20 ft. 7.72    Change in Gait Speed  Able to change speed, demonstrates mild gait deviations, deviates 6-10 in outside of the 12 in walkway width, or no gait deviations, unable to achieve a major change in velocity, or uses a change in velocity, or uses an assistive device.    Gait with Horizontal Head Turns  Performs head turns smoothly with slight change in gait velocity (eg, minor disruption to smooth gait path), deviates 6-10 in outside 12 in walkway width,  or uses an assistive device.    Gait with Vertical Head Turns  Performs task with slight change in gait velocity (eg, minor disruption to smooth gait path), deviates 6 - 10 in outside 12 in walkway width or uses assistive device    Gait and Pivot Turn  Pivot turns safely within 3 sec and stops quickly with no loss of balance.    Step Over Obstacle  Is able to step over one shoe box (4.5 in total height) without changing gait speed. No evidence of imbalance.    Gait with Narrow Base of Support  Ambulates 7-9 steps.    Gait with Eyes Closed  Cannot walk 20 ft without assistance, severe gait deviations or imbalance, deviates greater than 15 in outside 12 in walkway width or will not attempt task.    Ambulating Backwards  Walks 20 ft, uses assistive device, slower speed, mild gait deviations, deviates 6-10 in outside 12 in walkway width. 9.19 sec    Steps  Alternating feet, no rail.    Total Score  19    FGA comment:   Scores <19/30 indicate increased fall risk                   [facility] Adult PT Treatment/Exercise - 05/05/18 0001      Standardized Balance Assessment   Standardized Balance Assessment  Berg Balance Test;Dynamic Gait Index      Berg Balance Test   Sit to Stand  Able to stand without using hands and stabilize independently    Standing Unsupported  Able to stand safely 2 minutes    Sitting with Back Unsupported but Feet Supported on Floor or Stool  Able to sit safely and securely 2 minutes    Stand to Sit  Sits safely with minimal use of hands    Transfers  Able to transfer safely, minor use of hands    Standing Unsupported with Eyes Closed  Able to stand 10 seconds with supervision    Standing Ubsupported with Feet Together  Able to place feet together independently and stand 1 minute safely    From Standing, Reach Forward with Outstretched Arm  Can reach confidently >25 cm (10")    From Standing Position, Pick up Object from Floor  Able to pick up shoe safely and easily    From Standing Position, Turn to Look Behind Over each Shoulder  Looks behind from both sides and weight shifts well    Turn 360 Degrees  Able to turn 360 degrees safely in 4 seconds or less    Standing Unsupported, Alternately Place Feet on Step/Stool  Able to stand independently and safely and complete 8 steps in 20 seconds    Standing Unsupported, One Foot in Front  Able to plae foot ahead of the other independently and hold 30 seconds    Standing on One Leg  Able to lift leg independently and hold equal to or more than 3 seconds 4.56 LLE; 9.56 RLE    Total Score  52      Dynamic Gait Index   Level Surface  Mild Impairment    Change in Gait Speed  Normal    Gait with Horizontal Head Turns  Mild Impairment    Gait with Vertical Head Turns  Mild Impairment    Gait and Pivot Turn  Normal    Step Over Obstacle  Mild Impairment    Step Around Obstacles  Mild Impairment    Steps  Normal  Total Score  19       High Level Balance   High Level Balance Comments  Incorporating visual exercises (pt has had some of these from hospital), as pt says that turning head with gait makes him "dizzy" at times.  Seated x1 viewing, then standing x1 viewing, with pt rating dizziness as 4/10 in standing (2/10 in sitting).  Explained rules for x1 viewing in standing and added to HEP.             PT Education - 05/05/18 2114    Education provided  Yes    Education Details  HEP-added x1 viewing in standing    Person(s) Educated  Patient    Methods  Explanation;Demonstration;Handout    Comprehension  Verbalized understanding       PT Short Term Goals - 05/05/18 2115      PT SHORT TERM GOAL #1   Title  Pt will be independent with HEP for improved balance and gait.  TARGET 05/08/18    Time  5    Period  Weeks    Status  New      PT SHORT TERM GOAL #2   Title  Pt will improve TUG score to less than or equal to 13.5 seconds for decreased fall risk.    Time  5    Period  Weeks    Status  Achieved      PT SHORT TERM GOAL #3   Title  Pt will improve Berg score to at least 51/56 for decreased fall risk.    Time  5    Period  Weeks    Status  Achieved      PT SHORT TERM GOAL #4   Title  Pt will improve Dynamic Gait Index score to at least 13/24 for decreased fall risk.    Time  5    Period  Weeks    Status  Achieved      PT SHORT TERM GOAL #5   Title  Pt will negotiate 2 steps no rail, independently for safe entry/exit into home.    Time  5    Period  Weeks    Status  New        PT Long Term Goals - 05/05/18 2116      PT LONG TERM GOAL #1   Title  Pt will verbalize understanding of fall prevention in home environment.  TARGET 06/05/18    Time  9    Period  Weeks    Status  New      PT LONG TERM GOAL #2   Title  Pt will improve DGI score to at least 19/24 for decreased fall risk.    Time  9    Period  Weeks    Status  Achieved      PT LONG TERM GOAL #3   Title  Pt will improve gait  velocity to at least 2.62 ft/sec for improved community gait efficiency and safety.    Time  9    Period  Weeks    Status  New      PT LONG TERM GOAL #4   Title  Pt will ambulate at least 1000 ft, indoor and outdoor gait, with least restrictive assistive device, modified independently, for improved safety with community gait.    Time  9    Period  Weeks    Status  New      PT LONG TERM GOAL #5   Title  Pt  will improve FOTO score by at least 20% for pt-reported improved functional mobility at discharge.    Time  9    Period  Weeks    Status  New            Plan - 05/05/18 2116    Clinical Impression Statement  Assessed STGs this visit, with pt meeting STG 2, 3, 4 as well as LTG 2 for DGI.  Assessed most gait activities wtihout cane today, but pt is using cane consistently with gait at this time outside of therapy.  FGA performed today with pt scoring 19/30, which indicates increased fall risk.  Pt is making good progress with PT, but remains at fall risk and exhibits unsteadiness with gait with dual tasking, with fatigue, and with quick changes of direction.  Pt will continue to beneift from skilled PT to address balance and gait towards LTGs.    Rehab Potential  Good    PT Frequency  2x / week    PT Duration  -- 9 weeks    PT Treatment/Interventions  ADLs/Self Care Home Management;DME Instruction;Gait training;Stair training;Functional mobility training;Therapeutic activities;Therapeutic exercise;Balance training;Patient/family education;Neuromuscular re-education;Vestibular    PT Next Visit Plan  Check remaining STGs.  Continue compliant surfaces, indoor and outdoor gait training; visual exercises (gaze staiblization varied positions)    Consulted and Agree with Plan of Care  Patient    Family Member Consulted          Patient will benefit from skilled therapeutic intervention in order to improve the following deficits and impairments:  Abnormal gait, Decreased activity  tolerance, Decreased balance, Decreased coordination, Decreased mobility, Difficulty walking, Impaired vision/preception  Visit Diagnosis: Unsteadiness on feet  Other abnormalities of gait and mobility     Problem List Patient Active Problem List   Diagnosis Date Noted  . Acute CVA (cerebrovascular accident) ([place]) 03/26/2018  . Obesity (BMI 30.0-34.9) 03/26/2018  . Hyperlipidemia 03/26/2018  . OSA on CPAP 03/26/2018  . Hypertension 03/26/2018  . GERD (gastroesophageal reflux disease) 03/26/2018  . Leukocytosis 03/26/2018  . Acute hyperglycemia 03/26/2018    MARRIOTT,AMY W. 05/05/2018, 9:21 PM  Frazier Butt., PT   [facility] [address] [place], [place], [address] Phone: [phone]   Fax:  [phone]  Name: Chris [name] MRN: [id number] Date of Birth: Oct 31, 1946

## 2018-05-05 NOTE — Patient Instructions (Addendum)
Gaze Stabilization: Standing Feet Apart    Feet shoulder width apart, keeping eyes on target on wall __3__ feet away, tilt head down 15-30 and move head side to side for _30___ seconds. Repeat while moving head up and down for __30__ seconds. Do _1-2___ sessions per day. Repeat using target on pattern background.  Copyright  VHI. All rights reserved.

## 2018-05-05 NOTE — Telephone Encounter (Signed)
Anderson Malta with [facility] informed me that the CT is pending right now.. I called [employer] to check the status on the case and I spoke to the nurse and she went over all the notes with me that was submitted on 05/01/18.. The nurse stated that it is MD review right now and a peer to peer cannot be done yet until it has been denied. The case number is [id number].

## 2018-05-05 NOTE — Therapy (Signed)
[facility] [address] [place], [place], [address] Phone: [phone]   Fax:  [phone]  Occupational Therapy Treatment  Patient Details  Name: Chris [name] MRN: [id number] Date of Birth: 30-Jun-1946 Referring Provider: Dr. Leonie Man   Encounter Date: 05/05/2018  OT End of Session - 05/05/18 1445    Visit Number  7    Number of Visits  17    Date for OT Re-Evaluation  06/07/18    Authorization Type  [employer]na Medicare    OT Start Time  1400    OT Stop Time  1444    OT Time Calculation (min)  44 min    Activity Tolerance  Patient tolerated treatment well    Behavior During Therapy  [facility] for tasks assessed/performed       Past Medical History:  Diagnosis Date  . Acute CVA (cerebrovascular accident) ([place]) 03/25/2018   "double vision and balance issues" (03/26/2018)  . ADD (attention deficit disorder)   . Arthritis    "thumbs and fingers" (03/26/2018)  . Diverticulosis   . GERD (gastroesophageal reflux disease)   . Heart murmur    "when I was young; not noted since age 20" (03/26/2018)  . History of kidney stones   . Hyperlipidemia   . Hypertension   . IFG (impaired fasting glucose)   . Metabolic syndrome   . OSA on CPAP   . Pneumonia    "a time or 2" (03/26/2018)  . PONV (postoperative nausea and vomiting)   . Shingles ~ 2002  . TIA (transient ischemic attack) 2013    Past Surgical History:  Procedure Laterality Date  . CARPAL TUNNEL RELEASE Right   . CYSTOSCOPY W/ STONE MANIPULATION    . FINGER SURGERY Left    thumb surgery; "took bone out; put tendon in"  . TONSILLECTOMY AND ADENOIDECTOMY      There were no vitals filed for this visit.  Subjective Assessment - 05/05/18 1430    Subjective   If I didn't get any better than this, I would be ok.  Ihave come along way.      Pertinent History  TIA, HTN, dyslipidemia, glucose intolerance sleep apnea with CPAP    Patient Stated Goals  get back to normal    Currently in Pain?  No/denies    Pain Score  0-No pain                   OT Treatments/Exercises (OP) - 05/05/18 0001      ADLs   ADL Comments  Reviewed remaining long term goals - patient continues to progress.        Cognitive Exercises   Other Cognitive Exercises 1  Worked on challenging patient to alternate his attention between two unique  visual / cognitive tasks.  Patient completed word search.  Patient able to follow discreet instruction - sorting cards by suit , and switch set to different task with even numbered balck card.  Patient able to recognize error and correct without prompting.      Other Cognitive Exercises 2  Reviewed strategies to address short term memory issues.               OT Education - 05/05/18 1444    Education provided  Yes    Education Details  reviewed remaining long term goals    Person(s) Educated  Patient    Methods  Explanation;Demonstration    Comprehension  Verbalized understanding  OT Short Term Goals - 04/22/18 1008      OT SHORT TERM GOAL #1   Title  I with HEP.    Status  Achieved      OT SHORT TERM GOAL #2   Title  Pt will verbalize understanding of compensatory strategies for visual deficits.    Status  Achieved      OT SHORT TERM GOAL #3   Title  Pt will perform all basic ADLs modified independently    Status  Achieved      OT SHORT TERM GOAL #4   Title  Pt will report ability to read for 30 mins with minimal reports of diplopia.    Status  Achieved        OT Long Term Goals - 05/05/18 1446      OT LONG TERM GOAL #1   Title  Pt will perform home managment activities at a modified independent level without LOB.    Status  Achieved      OT LONG TERM GOAL #2   Title  Pt will perform mod complex environmental scanning activities with 95% accuracy and no LOB.    Status  On-going      OT LONG TERM GOAL #3   Title  Pt will verbalize understanding of compensatory strategies for short term memory  deficits    Status  Achieved      OT LONG TERM GOAL #4   Title  Pt will demonstrate ability to perfrom alternating attention task with a visual componant with 90% or better accuracy in prep for driving.    Status  Achieved            Plan - 05/05/18 1445    Clinical Impression Statement  Pt is progressing towards goals. He has returned to driving on back roads with supervision without difficulty    Occupational Profile and client history currently impacting functional performance  PMH: TIA, HTN, hyperlipidemia, OSA on CPAP. 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, currently he requires supervision with ADLs and is unable to drive    Occupational performance deficits (Please refer to evaluation for details):  ADL's;IADL's;Play;Leisure;Social Participation    Rehab Potential  Good    Current Impairments/barriers affecting progress:  visual perceptual deficits, diplopia, decreased balance, cognitive deficits    OT Frequency  2x / week    OT Duration  8 weeks    OT Treatment/Interventions  Self-care/ADL training;Therapeutic exercise;Visual/perceptual remediation/compensation;Patient/family education;Neuromuscular education;Moist Heat;Energy conservation;Therapist, nutritional;Therapeutic activities;Balance training;Cognitive remediation/compensation;Manual Therapy;DME and/or AE instruction    Plan  Possible discharge visit- one remaining long term goal    Clinical Decision Making  Several treatment options, min-mod task modification necessary    Consulted and Agree with Plan of Care  Patient       Patient will benefit from skilled therapeutic intervention in order to improve the following deficits and impairments:  Abnormal gait, Decreased cognition, Decreased knowledge of use of DME, Impaired vision/preception, Decreased mobility, Decreased activity tolerance, Decreased coordination, Impaired perceived functional ability, Difficulty walking,  Decreased safety awareness, Decreased knowledge of precautions, Decreased balance  Visit Diagnosis: Visuospatial deficit  Cognitive social or emotional deficit following cerebral infarction  Unsteadiness on feet    Problem List Patient Active Problem List   Diagnosis Date Noted  . Acute CVA (cerebrovascular accident) ([place]) 03/26/2018  . Obesity (BMI 30.0-34.9) 03/26/2018  . Hyperlipidemia 03/26/2018  . OSA on CPAP 03/26/2018  . Hypertension 03/26/2018  .  GERD (gastroesophageal reflux disease) 03/26/2018  . Leukocytosis 03/26/2018  . Acute hyperglycemia 03/26/2018    Mariah Milling, OTR/L 05/05/2018, 2:49 PM  [facility] [address] [place], [place], [address] Phone: [phone]   Fax:  [phone]  Name: Chris [name] MRN: [id number] Date of Birth: 25-Feb-1946

## 2018-05-06 ENCOUNTER — Other Ambulatory Visit: Payer: Medicare HMO

## 2018-05-07 ENCOUNTER — Ambulatory Visit: Payer: Medicare HMO | Admitting: Occupational Therapy

## 2018-05-07 ENCOUNTER — Ambulatory Visit: Payer: Medicare HMO | Admitting: Physical Therapy

## 2018-05-07 ENCOUNTER — Encounter: Payer: Self-pay | Admitting: Physical Therapy

## 2018-05-07 DIAGNOSIS — R41842 Visuospatial deficit: Secondary | ICD-10-CM | POA: Diagnosis not present

## 2018-05-07 DIAGNOSIS — R2689 Other abnormalities of gait and mobility: Secondary | ICD-10-CM

## 2018-05-07 DIAGNOSIS — R42 Dizziness and giddiness: Secondary | ICD-10-CM | POA: Diagnosis not present

## 2018-05-07 DIAGNOSIS — I69315 Cognitive social or emotional deficit following cerebral infarction: Secondary | ICD-10-CM | POA: Diagnosis not present

## 2018-05-07 DIAGNOSIS — R2681 Unsteadiness on feet: Secondary | ICD-10-CM | POA: Diagnosis not present

## 2018-05-07 NOTE — Therapy (Signed)
[facility] [address] [place], [place], [address] Phone: [phone]   Fax:  [phone]  Occupational Therapy Treatment  Patient Details  Name: Chris [name] MRN: [id number] Date of Birth: Aug 16, 1946 Referring Provider: Dr. Leonie Man   Encounter Date: 05/07/2018  OT End of Session - 05/07/18 0939    Visit Number  8    Number of Visits  17    Date for OT Re-Evaluation  06/07/18    Authorization Type  [employer]    OT Start Time  [phone]    OT Stop Time  1015    OT Time Calculation (min)  39 min       Past Medical History:  Diagnosis Date  . Acute CVA (cerebrovascular accident) ([place]) 03/25/2018   "double vision and balance issues" (03/26/2018)  . ADD (attention deficit disorder)   . Arthritis    "thumbs and fingers" (03/26/2018)  . Diverticulosis   . GERD (gastroesophageal reflux disease)   . Heart murmur    "when I was young; not noted since age 62" (03/26/2018)  . History of kidney stones   . Hyperlipidemia   . Hypertension   . IFG (impaired fasting glucose)   . Metabolic syndrome   . OSA on CPAP   . Pneumonia    "a time or 2" (03/26/2018)  . PONV (postoperative nausea and vomiting)   . Shingles ~ 2002  . TIA (transient ischemic attack) 2013    Past Surgical History:  Procedure Laterality Date  . CARPAL TUNNEL RELEASE Right   . CYSTOSCOPY W/ STONE MANIPULATION    . FINGER SURGERY Left    thumb surgery; "took bone out; put tendon in"  . TONSILLECTOMY AND ADENOIDECTOMY      There were no vitals filed for this visit.  Subjective Assessment - 05/07/18 0938    Pertinent History  TIA, HTN, dyslipidemia, glucose intolerance sleep apnea with CPAP    Patient Stated Goals  get back to normal    Currently in Pain?  Yes    Pain Score  2     Pain Location  Leg    Pain Orientation  Right;Left    Pain Descriptors / Indicators  Aching    Pain Type  Acute pain    Pain Onset  More than a month ago    Pain  Frequency  Intermittent    Aggravating Factors   yardwork    Pain Relieving Factors  rest         Treatment: Therapist checked progress towards remaining goal. Pt reports he feels comfortable with d/c today. Pt performed visual memory task on I pad from constant therapy, pt found tasks requiring him to recall a 6 items sequence to be challenging. Environmental scanning task mod complex to locate items in sequence while tossing a ball, pt locate 12/14 items in sequence on first pass. Pt missed items on left side. therapist reinforced with pt importance of double checking left side while driving before changing lanes.  Dynamic balance activity standing on non compliant surface to retrieve items from a lower surface reaching to left and right sides, no LOB.                     OT Short Term Goals - 05/07/18 1001      OT SHORT TERM GOAL #1   Title  I with HEP.    Status  Achieved      OT SHORT TERM GOAL #2  Title  Pt will verbalize understanding of compensatory strategies for visual deficits.    Status  Achieved      OT SHORT TERM GOAL #3   Title  Pt will perform all basic ADLs modified independently    Status  Achieved      OT SHORT TERM GOAL #4   Title  Pt will report ability to read for 30 mins with minimal reports of diplopia.    Status  Achieved        OT Long Term Goals - 05/07/18 1001      OT LONG TERM GOAL #1   Title  Pt will perform home managment activities at a modified independent level without LOB.    Status  Achieved      OT LONG TERM GOAL #2   Title  Pt will perform mod complex environmental scanning activities with 95% accuracy and no LOB.    Status  Not Met 86 %, pt aware to double check left side during driving activities      OT LONG TERM GOAL #3   Title  Pt will verbalize understanding of compensatory strategies for short term memory deficits    Status  Achieved      OT LONG TERM GOAL #4   Title  Pt will demonstrate ability to  perfrom alternating attention task with a visual componant with 90% or better accuracy in prep for driving.    Status  Achieved            Plan - 05/07/18 1003    Clinical Impression Statement  Pt is progressing towards goals. He is in agreement with plans for d/c.    Occupational Profile and client history currently impacting functional performance  PMH: TIA, HTN, hyperlipidemia, OSA on CPAP. 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, currently he requires supervision with ADLs and is unable to drive    Occupational performance deficits (Please refer to evaluation for details):  ADL's;IADL's;Play;Leisure;Social Participation    Rehab Potential  Good    Current Impairments/barriers affecting progress:  visual perceptual deficits, diplopia, decreased balance, cognitive deficits    OT Frequency  2x / week    OT Duration  8 weeks    OT Treatment/Interventions  Self-care/ADL training;Therapeutic exercise;Visual/perceptual remediation/compensation;Patient/family education;Neuromuscular education;Moist Heat;Energy conservation;Therapist, nutritional;Therapeutic activities;Balance training;Cognitive remediation/compensation;Manual Therapy;DME and/or AE instruction    Plan  d/c OT    Consulted and Agree with Plan of Care  Patient       Patient will benefit from skilled therapeutic intervention in order to improve the following deficits and impairments:  Abnormal gait, Decreased cognition, Decreased knowledge of use of DME, Impaired vision/preception, Decreased mobility, Decreased activity tolerance, Decreased coordination, Impaired perceived functional ability, Difficulty walking, Decreased safety awareness, Decreased knowledge of precautions, Decreased balance  Visit Diagnosis: Visuospatial deficit  Cognitive social or emotional deficit following cerebral infarction  Other abnormalities of gait and mobility   OCCUPATIONAL THERAPY DISCHARGE  SUMMARY   Current functional level related to goals / functional outcomes: Pt demonstrates good overall progress. See goals.   Remaining deficits: Visual perceptual deficits, decreased balance   Education / Equipment: Pt  demonstrates good overall progress towards goals. He did not fully meet goal for environmental scanning however he plans to continue to work on this searching for items in a store. Pt was cautioned to double check his left side before merging when driving. Pt has returned to driving with his wife and he reports  he is doing okay. Pt was educated regarding HEP and compensatory strategies for visual deficits. Pt verbalized understanding. Pt agrees with plans for d/c. Plan: Patient agrees to discharge.  Patient goals were partially met. Patient is being discharged due to meeting the stated rehab goals.  ?????      Problem List Patient Active Problem List   Diagnosis Date Noted  . Acute CVA (cerebrovascular accident) ([place]) 03/26/2018  . Obesity (BMI 30.0-34.9) 03/26/2018  . Hyperlipidemia 03/26/2018  . OSA on CPAP 03/26/2018  . Hypertension 03/26/2018  . GERD (gastroesophageal reflux disease) 03/26/2018  . Leukocytosis 03/26/2018  . Acute hyperglycemia 03/26/2018    Shanara Schnieders 05/07/2018, 12:15 PM Theone Murdoch, OTR/L [phone] Phone: [phone] 12:21 PM 05/07/18 [facility] [address] [place], [place], [address] Phone: [phone]   Fax:  [phone]  Name: WINIFRED BALOGH MRN: [id number] Date of Birth: 1946-10-28

## 2018-05-08 NOTE — Therapy (Signed)
[facility] [address] [place], [place], [address] Phone: [phone]   Fax:  [phone]  Physical Therapy Treatment  Patient Details  Name: Chris [name] MRN: [id number] Date of Birth: 1946/12/08 Referring Provider: Dr. Leonie Man   Encounter Date: 05/07/2018  PT End of Session - 05/07/18 1111    Visit Number  9    Number of Visits  18    Date for PT Re-Evaluation  07/07/18    Authorization Type  [employer] Medicare    PT Start Time  1106    PT Stop Time  1146    PT Time Calculation (min)  40 min    Equipment Utilized During Treatment  Gait belt    Activity Tolerance  Patient tolerated treatment well    Behavior During Therapy  [name] for tasks assessed/performed       Past Medical History:  Diagnosis Date  . Acute CVA (cerebrovascular accident) ([place]) 03/25/2018   "double vision and balance issues" (03/26/2018)  . ADD (attention deficit disorder)   . Arthritis    "thumbs and fingers" (03/26/2018)  . Diverticulosis   . GERD (gastroesophageal reflux disease)   . Heart murmur    "when I was young; not noted since age 77" (03/26/2018)  . History of kidney stones   . Hyperlipidemia   . Hypertension   . IFG (impaired fasting glucose)   . Metabolic syndrome   . OSA on CPAP   . Pneumonia    "a time or 2" (03/26/2018)  . PONV (postoperative nausea and vomiting)   . Shingles ~ 2002  . TIA (transient ischemic attack) 2013    Past Surgical History:  Procedure Laterality Date  . CARPAL TUNNEL RELEASE Right   . CYSTOSCOPY W/ STONE MANIPULATION    . FINGER SURGERY Left    thumb surgery; "took bone out; put tendon in"  . TONSILLECTOMY AND ADENOIDECTOMY      There were no vitals filed for this visit.  Subjective Assessment - 05/07/18 1109    Subjective  No new complaints. Continues to do the x1 exercises issued at last session with continued mild symptoms reported. No falls. Woke up with some lower back/leg pain, gone now as  activity improves it.     Patient Stated Goals  Pt's goals for PT are to get back to normal, do things around the house, and drive    Currently in Pain?  No/denies    Pain Score  0-No pain          [facility] Adult PT Treatment/Exercise - 05/07/18 1112      Transfers   Transfers  Sit to Stand;Stand to Sit    Sit to Stand  5: Supervision;Without upper extremity assist;From chair/3-in-1    Stand to Sit  5: Supervision;Without upper extremity assist;To chair/3-in-1      Ambulation/Gait   Ambulation/Gait  Yes    Ambulation/Gait Assistance  5: Supervision    Ambulation/Gait Assistance Details  no balance issues noted.     Ambulation Distance (Feet)  600 Feet    Assistive device  Straight cane    Gait Pattern  Step-through pattern;Decreased arm swing - right;Decreased arm swing - left;Decreased step length - right;Decreased step length - left;Wide base of support    Ambulation Surface  Level;Indoor;Unlevel;Outdoor;Paved;Gravel;Grass    Stairs  Yes    Stairs Assistance  5: Supervision    Stair Management Technique  No rails;Alternating pattern;Forwards    Number of Stairs  4  Self-Care   Self-Care  Other Self-Care Comments    Other Self-Care Comments   reviewed current HEP issued to date with handouts. Pt reports doing them with continued challenge, no issues                          Balance Exercises - 05/07/18 1128      Balance Exercises: Standing   Rockerboard  Anterior/posterior;Lateral;Head turns;EO;EC;30 seconds;10 reps    Tandem Gait  Forward;Retro;Foam/compliant surface;3 reps;Limitations    Sidestepping  Foam/compliant support;3 reps;Limitations    Sit to Stand Time  with feet over red beam: sit<>stand x 10 reps with no to little UE support. cues on full upright standing and slow, controlled descent.       Balance Exercises: Standing   Rebounder Limitations  performed both ways on balance board: EO rocking board with all posture, progressing to EC with rocking the  board; holding board steady- EO alternating UE raises, then bil UE raises; EC no head movements, progressing to EC head movements left<>right and up<>down. min guard assist with cues on posture and weight shifting to correct balance losses.     Tandem Gait Limitations  on outdoor rubber mulch: 3 laps each way, cane assist with min guard to min assist needed for balance. cues on posture and ex form.     Sidestepping Limitations  on outdoor gravel- no cane used, min guard to min assist for balance for 3 laps each way. cues on posture and foot clearnance with each step,           PT Short Term Goals - 05/07/18 1111      PT SHORT TERM GOAL #1   Title  Pt will be independent with HEP for improved balance and gait.  TARGET 05/08/18    Baseline  05/07/18: met today    Time  --    Period  --    Status  Achieved      PT SHORT TERM GOAL #2   Title  Pt will improve TUG score to less than or equal to 13.5 seconds for decreased fall risk.    Baseline  met on 05/05/18    Status  Achieved      PT SHORT TERM GOAL #3   Title  Pt will improve Berg score to at least 51/56 for decreased fall risk.    Baseline  met on 05/05/18    Status  Achieved      PT SHORT TERM GOAL #4   Title  Pt will improve Dynamic Gait Index score to at least 13/24 for decreased fall risk.    Baseline  met on 05/05/18    Status  Achieved      PT SHORT TERM GOAL #5   Title  Pt will negotiate 2 steps no rail, independently for safe entry/exit into home.    Baseline  05/07/18; met today    Time  --    Period  --    Status  Achieved        PT Long Term Goals - 05/05/18 2116      PT LONG TERM GOAL #1   Title  Pt will verbalize understanding of fall prevention in home environment.  TARGET 06/05/18    Time  9    Period  Weeks    Status  New      PT LONG TERM GOAL #2   Title  Pt will improve DGI score to at least  19/24 for decreased fall risk.    Time  9    Period  Weeks    Status  Achieved      PT LONG TERM GOAL #3    Title  Pt will improve gait velocity to at least 2.62 ft/sec for improved community gait efficiency and safety.    Time  9    Period  Weeks    Status  New      PT LONG TERM GOAL #4   Title  Pt will ambulate at least 1000 ft, indoor and outdoor gait, with least restrictive assistive device, modified independently, for improved safety with community gait.    Time  9    Period  Weeks    Status  New      PT LONG TERM GOAL #5   Title  Pt will improve FOTO score by at least 20% for pt-reported improved functional mobility at discharge.    Time  9    Period  Weeks    Status  New            Plan - 05/07/18 1111    Clinical Impression Statement  Today's skilled session focused on checking progress toward remaining STGs with both goals met. Remainder of session  continued to focus on balance reactions with pt continuing to be challenged by compliant surfaces and with vision removed. Pt is making steady progress toward goals and should benefit from continued PT to progress toward unmet goals.     Rehab Potential  Good    PT Frequency  2x / week    PT Duration  -- 9 weeks    PT Treatment/Interventions  ADLs/Self Care Home Management;DME Instruction;Gait training;Stair training;Functional mobility training;Therapeutic activities;Therapeutic exercise;Balance training;Patient/family education;Neuromuscular re-education;Vestibular    PT Next Visit Plan  Continue compliant surfaces, indoor and outdoor gait training; visual exercises (gaze staiblization varied positions)    Consulted and Agree with Plan of Care  Patient    Family Member Consulted          Patient will benefit from skilled therapeutic intervention in order to improve the following deficits and impairments:  Abnormal gait, Decreased activity tolerance, Decreased balance, Decreased coordination, Decreased mobility, Difficulty walking, Impaired vision/preception  Visit Diagnosis: Other abnormalities of gait and mobility  Dizziness  and giddiness  Unsteadiness on feet     Problem List Patient Active Problem List   Diagnosis Date Noted  . Acute CVA (cerebrovascular accident) ([place]) 03/26/2018  . Obesity (BMI 30.0-34.9) 03/26/2018  . Hyperlipidemia 03/26/2018  . OSA on CPAP 03/26/2018  . Hypertension 03/26/2018  . GERD (gastroesophageal reflux disease) 03/26/2018  . Leukocytosis 03/26/2018  . Acute hyperglycemia 03/26/2018    Willow Ora, PTA, [facility] [address] [place], [place] [address] [phone] 05/08/18, 4:02 PM   Name: Chris [name] MRN: [id number] Date of Birth: 1946/10/10

## 2018-05-11 NOTE — Telephone Encounter (Signed)
[employer] did not approve the CT Lumbar.   Holland Falling stated " based on their guidelines we cannot approve this request. CT Might be supported in the evaluation of suspected or known spinal disease with one of the following. Failure to improve after a recent (within 3 month) 6 week trial of provider- directed treatment with clinical re-evaluation or any signs or symptoms such as significant motor weakness, malignancy, infection, cauda equina syndrome, or planned surgery, for which conservative treatment is not needed. The clinical information received fails to support meeting these requirements and therefore the request  Is not indicated at this time."  If you would like to do a peer to peer their phone number is [phone] select option 4 and case number is [id number].

## 2018-05-12 ENCOUNTER — Ambulatory Visit: Payer: Medicare HMO | Admitting: Physical Therapy

## 2018-05-12 ENCOUNTER — Encounter: Payer: Self-pay | Admitting: Physical Therapy

## 2018-05-12 ENCOUNTER — Ambulatory Visit: Payer: Medicare HMO | Admitting: Occupational Therapy

## 2018-05-12 DIAGNOSIS — R2681 Unsteadiness on feet: Secondary | ICD-10-CM

## 2018-05-12 DIAGNOSIS — I69315 Cognitive social or emotional deficit following cerebral infarction: Secondary | ICD-10-CM | POA: Diagnosis not present

## 2018-05-12 DIAGNOSIS — R42 Dizziness and giddiness: Secondary | ICD-10-CM | POA: Diagnosis not present

## 2018-05-12 DIAGNOSIS — R2689 Other abnormalities of gait and mobility: Secondary | ICD-10-CM | POA: Diagnosis not present

## 2018-05-12 DIAGNOSIS — R41842 Visuospatial deficit: Secondary | ICD-10-CM | POA: Diagnosis not present

## 2018-05-12 NOTE — Telephone Encounter (Signed)
Recommend patient following up with PCP in order to obtain possible xray and consideration of PT for back pain as insurance did not approve CT lumbar. We will contact patient and also send this to PCP. Thank you.

## 2018-05-12 NOTE — Telephone Encounter (Signed)
I spoke with patient's wife and relayed to her that insurance was denying the CT Lumbar and that he should follow up with PCP for possible xray or PT. She will make patient aware and he will call back with any questions. I have forwarded this to his PCP as well.

## 2018-05-13 ENCOUNTER — Ambulatory Visit: Payer: Medicare HMO | Admitting: Physical Therapy

## 2018-05-13 ENCOUNTER — Encounter: Payer: Self-pay | Admitting: Physical Therapy

## 2018-05-13 ENCOUNTER — Ambulatory Visit: Payer: Medicare HMO | Admitting: Occupational Therapy

## 2018-05-13 DIAGNOSIS — R2681 Unsteadiness on feet: Secondary | ICD-10-CM | POA: Diagnosis not present

## 2018-05-13 DIAGNOSIS — R42 Dizziness and giddiness: Secondary | ICD-10-CM

## 2018-05-13 DIAGNOSIS — R2689 Other abnormalities of gait and mobility: Secondary | ICD-10-CM | POA: Diagnosis not present

## 2018-05-13 DIAGNOSIS — R41842 Visuospatial deficit: Secondary | ICD-10-CM | POA: Diagnosis not present

## 2018-05-13 DIAGNOSIS — I69315 Cognitive social or emotional deficit following cerebral infarction: Secondary | ICD-10-CM | POA: Diagnosis not present

## 2018-05-13 NOTE — Therapy (Signed)
[facility] [address] [place], [place], [address] Phone: [phone]   Fax:  [phone]  Physical Therapy Treatment  Patient Details  Name: Chris [name] MRN: [id number] Date of Birth: April 21, 1946 Referring Provider: Dr. Leonie Man   Encounter Date: 05/12/2018  PT End of Session - 05/13/18 0550    Visit Number  10    Number of Visits  18    Date for PT Re-Evaluation  07/07/18    Authorization Type  [employer]    PT Start Time  1318    PT Stop Time  1400    PT Time Calculation (min)  42 min    Activity Tolerance  Patient tolerated treatment well    Behavior During Therapy  [facility] for tasks assessed/performed       Past Medical History:  Diagnosis Date  . Acute CVA (cerebrovascular accident) ([place]) 03/25/2018   "double vision and balance issues" (03/26/2018)  . ADD (attention deficit disorder)   . Arthritis    "thumbs and fingers" (03/26/2018)  . Diverticulosis   . GERD (gastroesophageal reflux disease)   . Heart murmur    "when I was young; not noted since age 65" (03/26/2018)  . History of kidney stones   . Hyperlipidemia   . Hypertension   . IFG (impaired fasting glucose)   . Metabolic syndrome   . OSA on CPAP   . Pneumonia    "a time or 2" (03/26/2018)  . PONV (postoperative nausea and vomiting)   . Shingles ~ 2002  . TIA (transient ischemic attack) 2013    Past Surgical History:  Procedure Laterality Date  . CARPAL TUNNEL RELEASE Right   . CYSTOSCOPY W/ STONE MANIPULATION    . FINGER SURGERY Left    thumb surgery; "took bone out; put tendon in"  . TONSILLECTOMY AND ADENOIDECTOMY      There were no vitals filed for this visit.  Subjective Assessment - 05/12/18 1320    Subjective  Doing a lot of walking outside.  Would really like to do balance exercises today.  Brought in my papers to make sure I'm doing correctly.    Patient Stated Goals  Pt's goals for PT are to get back to normal, do things around  the house, and drive    Currently in Pain?  Yes    Pain Score  2     Pain Location  Leg    Pain Orientation  Right;Left    Pain Descriptors / Indicators  Aching    Pain Type  Chronic pain    Pain Onset  More than a month ago    Pain Frequency  Intermittent    Aggravating Factors   worse in the morning    Pain Relieving Factors  moving around through the day    Multiple Pain Sites  No                            Balance Exercises - 05/13/18 0542      Balance Exercises: Standing   Standing Eyes Closed  Narrow base of support (BOS);Wide (BOA);10 secs;30 secs Head turns, nods, diagonals x 10 EC feet together    SLS  Eyes open;Intermittent upper extremity support;Eyes closed;Upper extremity support 2;Solid surface;2 reps;10 secs    Rockerboard  Anterior/posterior;Lateral;Head turns;EO;EC;30 seconds;10 reps Arm swing, UE lifts x 10 reps    Balance Beam  Squats x 10 reps on blue beam  Tandem Gait  Forward;Retro;3 reps along counter solid surface, in bars-on balance beam    Retro Gait  3 reps in parallel bars    Sidestepping  Foam/compliant support;3 reps;Upper extremity support    Other Standing Exercises  Reviewed pt's HEP from 04/13/18 and 04/22/18.  Pt performs these exercises appropriately.  HEP from 4/22 d/c'd due to ease of performance of exercises.  Performed standing on BOSU gentle ant/post and lateral weightshifting.  Reviewed standing x 1 viewing, with no c/o dizziness.  Progressed to partial standing, x1 viewing x 30 seconds each foot position.     Performed x 1 viewing in standing with partial tandem stance position on busy background, x 5 reps each.  Verbally recommended pt could try partial tandem stance and possibly busier background, such as wallpaper at home.  Does describe increase in double vision on busy background. Also performed gait with head turns, 10-15 ft, 6 reps, with cues to widen BOS (increased bilateral foot internal rotation noted)   PT  Education - 05/13/18 0549    Education provided  Yes    Education Details  Discharged HEP from 04/13/18; kept HEP from 04/22/18 as well as x1 viewing in standing    Person(s) Educated  Patient    Methods  Explanation;Demonstration    Comprehension  Verbalized understanding;Returned demonstration       PT Short Term Goals - 05/07/18 1111      PT SHORT TERM GOAL #1   Title  Pt will be independent with HEP for improved balance and gait.  TARGET 05/08/18    Baseline  05/07/18: met today    Time  --    Period  --    Status  Achieved      PT SHORT TERM GOAL #2   Title  Pt will improve TUG score to less than or equal to 13.5 seconds for decreased fall risk.    Baseline  met on 05/05/18    Status  Achieved      PT SHORT TERM GOAL #3   Title  Pt will improve Berg score to at least 51/56 for decreased fall risk.    Baseline  met on 05/05/18    Status  Achieved      PT SHORT TERM GOAL #4   Title  Pt will improve Dynamic Gait Index score to at least 13/24 for decreased fall risk.    Baseline  met on 05/05/18    Status  Achieved      PT SHORT TERM GOAL #5   Title  Pt will negotiate 2 steps no rail, independently for safe entry/exit into home.    Baseline  05/07/18; met today    Time  --    Period  --    Status  Achieved        PT Long Term Goals - 05/05/18 2116      PT LONG TERM GOAL #1   Title  Pt will verbalize understanding of fall prevention in home environment.  TARGET 06/05/18    Time  9    Period  Weeks    Status  New      PT LONG TERM GOAL #2   Title  Pt will improve DGI score to at least 19/24 for decreased fall risk.    Time  9    Period  Weeks    Status  Achieved      PT LONG TERM GOAL #3   Title  Pt will improve gait velocity to  at least 2.62 ft/sec for improved community gait efficiency and safety.    Time  9    Period  Weeks    Status  New      PT LONG TERM GOAL #4   Title  Pt will ambulate at least 1000 ft, indoor and outdoor gait, with least restrictive  assistive device, modified independently, for improved safety with community gait.    Time  9    Period  Weeks    Status  New      PT LONG TERM GOAL #5   Title  Pt will improve FOTO score by at least 20% for pt-reported improved functional mobility at discharge.    Time  9    Period  Weeks    Status  New            Plan - 05/13/18 0550    Clinical Impression Statement  Focused on thorough review/performance of pt's HEP; he is able to perform HEP from 04/13/18 with ease; therefore these exercises discharged.  HEP from 04/22/18 remains challenging as well as x1 viewing in multiple foot positions.  Pt has difficulty maintaining balance on BOSU, even with UE support, and with dynamic walking with head turns, he needs cues to widen BOS for stability.  Pt will continue to benefit from skilled PT to address balance and gait.    Rehab Potential  Good    PT Frequency  2x / week    PT Duration  -- 9 weeks    PT Treatment/Interventions  ADLs/Self Care Home Management;DME Instruction;Gait training;Stair training;Functional mobility training;Therapeutic activities;Therapeutic exercise;Balance training;Patient/family education;Neuromuscular re-education;Vestibular    PT Next Visit Plan  Continue compliant surfaces, indoor and outdoor gait training; visual exercises (gaze staiblization varied positions)    Consulted and Agree with Plan of Care  Patient    Family Member Consulted          Patient will benefit from skilled therapeutic intervention in order to improve the following deficits and impairments:  Abnormal gait, Decreased activity tolerance, Decreased balance, Decreased coordination, Decreased mobility, Difficulty walking, Impaired vision/preception  Visit Diagnosis: Unsteadiness on feet     Problem List Patient Active Problem List   Diagnosis Date Noted  . Acute CVA (cerebrovascular accident) ([place]) 03/26/2018  . Obesity (BMI 30.0-34.9) 03/26/2018  . Hyperlipidemia 03/26/2018  . OSA  on CPAP 03/26/2018  . Hypertension 03/26/2018  . GERD (gastroesophageal reflux disease) 03/26/2018  . Leukocytosis 03/26/2018  . Acute hyperglycemia 03/26/2018    MARRIOTT,AMY W. 05/13/2018, 5:53 AM Frazier Butt., PT  [facility] [address] [place], [place], [address] Phone: [phone]   Fax:  [phone]  Name: Chris [name] MRN: [id number] Date of Birth: October 20, 1946

## 2018-05-14 NOTE — Therapy (Signed)
[facility] [address] [place], [place], [address] Phone: [phone]   Fax:  [phone]  Physical Therapy Treatment  Patient Details  Name: Chris [name] MRN: [id number] Date of Birth: [birth year] Referring Provider: Dr. Leonie Man   Encounter Date: 05/13/2018  PT End of Session - 05/13/18 0938    Visit Number  11    Number of Visits  18    Date for PT Re-Evaluation  07/07/18    Authorization Type  [employer]na Medicare    PT Start Time  0935    PT Stop Time  1015    PT Time Calculation (min)  40 min    Equipment Utilized During Treatment  Gait belt    Activity Tolerance  Patient tolerated treatment well    Behavior During Therapy  [facility] for tasks assessed/performed       Past Medical History:  Diagnosis Date  . Acute CVA (cerebrovascular accident) ([place]) 03/25/2018   "double vision and balance issues" (03/26/2018)  . ADD (attention deficit disorder)   . Arthritis    "thumbs and fingers" (03/26/2018)  . Diverticulosis   . GERD (gastroesophageal reflux disease)   . Heart murmur    "when I was young; not noted since age 31" (03/26/2018)  . History of kidney stones   . Hyperlipidemia   . Hypertension   . IFG (impaired fasting glucose)   . Metabolic syndrome   . OSA on CPAP   . Pneumonia    "a time or 2" (03/26/2018)  . PONV (postoperative nausea and vomiting)   . Shingles ~ 2002  . TIA (transient ischemic attack) 2013    Past Surgical History:  Procedure Laterality Date  . CARPAL TUNNEL RELEASE Right   . CYSTOSCOPY W/ STONE MANIPULATION    . FINGER SURGERY Left    thumb surgery; "took bone out; put tendon in"  . TONSILLECTOMY AND ADENOIDECTOMY      There were no vitals filed for this visit.  Subjective Assessment - 05/13/18 0938    Subjective  No new complaints. No falls or pain to report.     Patient is accompained by:  Family member    Patient Stated Goals  Pt's goals for PT are to get back to normal, do things  around the house, and drive    Currently in Pain?  No/denies    Pain Score  0-No pain         [facility] Adult PT Treatment/Exercise - 05/13/18 0939      Ambulation/Gait   Ambulation/Gait  Yes    Ambulation/Gait Assistance  5: Supervision    Ambulation Distance (Feet)  -- around gym with activity    Assistive device  None    Gait Pattern  Step-through pattern;Decreased arm swing - right;Decreased arm swing - left;Decreased step length - right;Decreased step length - left;Wide base of support    Ambulation Surface  Level;Indoor      Neuro Re-ed    Neuro Re-ed Details   for balance/muscle re-ed: along ~50 foot hallway: forward gait with head movements left<>fwd<>right, up<>fwd<>down x 4 laps each, min guard to min assist. decr gait speed and veering noted with gait;       Vestibular Treatment/Exercise - 05/13/18 1001      Vestibular Treatment/Exercise   Gaze Exercises  X2 Viewing Horizontal;X2 Viewing Vertical      X2 Viewing Horizontal   Foot Position  wide (hip width apart) on open blue foam    Time  --  30 sec's    Reps  3     Comments  no dizziness or symtoms reported with any reps      X2 Viewing Vertical   Foot Position  wide (hip width apart) on open blue foam    Time  -- 30 seconds    Reps  3    Comments  no dizziness or other symptoms reported with any reps          Balance Exercises - 05/13/18 0947      Balance Exercises: Standing   Standing Eyes Closed  Wide (BOA);Foam/compliant surface;Other reps (comment);30 secs;Limitations    SLS with Vectors  Foam/compliant surface;Other reps (comment);Limitations    Balance Beam  standing across blue foam beam: alternating fwd heel taps to floor/back onto beam, alternating bwd toe taps to floor/back onto beam. cues on posture, stance position and step length/height for improved balance.     Partial Tandem Stance  Eyes closed;Foam/compliant surface;Other reps (comment);Limitations      Balance Exercises: Standing   Standing  Eyes Closed Limitations  feet hip width apart on open blue foam mat: EC head movements left<>right, up<>down and diagonals both ways x 10 reps each with up to min assist for balance, intermittent touch to chair/wall for balance assistance.     Tandem Stance Limitations  modified tandem stance on open blue foam: EC head movements left<>right and up<>down with each foot forward, min to mod assist for balance.     SLS with Vectors Limitations  standing across blue foam beam with 2 cones in front : alternating fwd toe taps to cones ,then alternating cross toe taps to cones. min to mod assist for balance with cues on posture and weight shifitng.           PT Short Term Goals - 05/07/18 1111      PT SHORT TERM GOAL #1   Title  Pt will be independent with HEP for improved balance and gait.  TARGET 05/08/18    Baseline  05/07/18: met today    Time  --    Period  --    Status  Achieved      PT SHORT TERM GOAL #2   Title  Pt will improve TUG score to less than or equal to 13.5 seconds for decreased fall risk.    Baseline  met on 05/05/18    Status  Achieved      PT SHORT TERM GOAL #3   Title  Pt will improve Berg score to at least 51/56 for decreased fall risk.    Baseline  met on 05/05/18    Status  Achieved      PT SHORT TERM GOAL #4   Title  Pt will improve Dynamic Gait Index score to at least 13/24 for decreased fall risk.    Baseline  met on 05/05/18    Status  Achieved      PT SHORT TERM GOAL #5   Title  Pt will negotiate 2 steps no rail, independently for safe entry/exit into home.    Baseline  05/07/18; met today    Time  --    Period  --    Status  Achieved        PT Long Term Goals - 05/05/18 2116      PT LONG TERM GOAL #1   Title  Pt will verbalize understanding of fall prevention in home environment.  TARGET 06/05/18    Time  9    Period  Weeks    Status  New      PT LONG TERM GOAL #2   Title  Pt will improve DGI score to at least 19/24 for decreased fall risk.    Time   9    Period  Weeks    Status  Achieved      PT LONG TERM GOAL #3   Title  Pt will improve gait velocity to at least 2.62 ft/sec for improved community gait efficiency and safety.    Time  9    Period  Weeks    Status  New      PT LONG TERM GOAL #4   Title  Pt will ambulate at least 1000 ft, indoor and outdoor gait, with least restrictive assistive device, modified independently, for improved safety with community gait.    Time  9    Period  Weeks    Status  New      PT LONG TERM GOAL #5   Title  Pt will improve FOTO score by at least 20% for pt-reported improved functional mobility at discharge.    Time  9    Period  Weeks    Status  New         Plan - 05/13/18 4098    Clinical Impression Statement  Today's skilled session continued to focus on high level balance for improved balance reactions and dynamic gait activities without an AD with no issues noted. Pt is making steady progress toward goals and should benefit from continued PT to progress toward unmet goals.    Rehab Potential  Good    PT Frequency  2x / week    PT Duration  -- 9 weeks    PT Treatment/Interventions  ADLs/Self Care Home Management;DME Instruction;Gait training;Stair training;Functional mobility training;Therapeutic activities;Therapeutic exercise;Balance training;Patient/family education;Neuromuscular re-education;Vestibular    PT Next Visit Plan  Continue compliant surfaces, indoor and outdoor gait training; visual exercises (gaze staiblization varied positions)    Consulted and Agree with Plan of Care  Patient    Family Member Consulted          Patient will benefit from skilled therapeutic intervention in order to improve the following deficits and impairments:  Abnormal gait, Decreased activity tolerance, Decreased balance, Decreased coordination, Decreased mobility, Difficulty walking, Impaired vision/preception  Visit Diagnosis: Unsteadiness on feet  Other abnormalities of gait and  mobility  Dizziness and giddiness     Problem List Patient Active Problem List   Diagnosis Date Noted  . Acute CVA (cerebrovascular accident) ([place]) 03/26/2018  . Obesity (BMI 30.0-34.9) 03/26/2018  . Hyperlipidemia 03/26/2018  . OSA on CPAP 03/26/2018  . Hypertension 03/26/2018  . GERD (gastroesophageal reflux disease) 03/26/2018  . Leukocytosis 03/26/2018  . Acute hyperglycemia 03/26/2018    Willow Ora, PTA, [facility] [place] [address] [place], [place] [address] [phone] 05/14/18, 1:40 PM  Name: AHSAN ESTERLINE MRN: [id number] Date of Birth: 1946-11-13

## 2018-05-15 ENCOUNTER — Other Ambulatory Visit: Payer: Medicare HMO

## 2018-05-19 ENCOUNTER — Other Ambulatory Visit: Payer: Self-pay | Admitting: Adult Health

## 2018-05-19 ENCOUNTER — Ambulatory Visit: Payer: Medicare HMO | Admitting: Physical Therapy

## 2018-05-19 DIAGNOSIS — M5442 Lumbago with sciatica, left side: Principal | ICD-10-CM

## 2018-05-19 DIAGNOSIS — M5441 Lumbago with sciatica, right side: Principal | ICD-10-CM

## 2018-05-19 DIAGNOSIS — G8929 Other chronic pain: Secondary | ICD-10-CM

## 2018-05-26 ENCOUNTER — Encounter: Payer: Medicare HMO | Admitting: Occupational Therapy

## 2018-05-26 ENCOUNTER — Ambulatory Visit: Payer: Medicare HMO | Attending: Family Medicine | Admitting: Physical Therapy

## 2018-05-26 DIAGNOSIS — R2681 Unsteadiness on feet: Secondary | ICD-10-CM | POA: Diagnosis not present

## 2018-05-26 DIAGNOSIS — R2689 Other abnormalities of gait and mobility: Secondary | ICD-10-CM

## 2018-05-27 ENCOUNTER — Encounter: Payer: Self-pay | Admitting: Physical Therapy

## 2018-05-27 NOTE — Therapy (Signed)
[facility] [address] [place], [place], [address] Phone: [phone]   Fax:  [phone]  Physical Therapy Treatment  Patient Details  Name: Chris [name] MRN: [id number] Date of Birth: 12-02-1946 Referring Provider: Dr. Leonie Man   Encounter Date: 05/26/2018  PT End of Session - 05/27/18 1429    Visit Number  12    Number of Visits  18    Date for PT Re-Evaluation  07/07/18    Authorization Type  [employer] Medicare    PT Start Time  1017    PT Stop Time  1101    PT Time Calculation (min)  44 min    Equipment Utilized During Treatment  Gait belt    Activity Tolerance  Patient tolerated treatment well       Past Medical History:  Diagnosis Date  . Acute CVA (cerebrovascular accident) ([place]) 03/25/2018   "double vision and balance issues" (03/26/2018)  . ADD (attention deficit disorder)   . Arthritis    "thumbs and fingers" (03/26/2018)  . Diverticulosis   . GERD (gastroesophageal reflux disease)   . Heart murmur    "when I was young; not noted since age 52" (03/26/2018)  . History of kidney stones   . Hyperlipidemia   . Hypertension   . IFG (impaired fasting glucose)   . Metabolic syndrome   . OSA on CPAP   . Pneumonia    "a time or 2" (03/26/2018)  . PONV (postoperative nausea and vomiting)   . Shingles ~ 2002  . TIA (transient ischemic attack) 2013    Past Surgical History:  Procedure Laterality Date  . CARPAL TUNNEL RELEASE Right   . CYSTOSCOPY W/ STONE MANIPULATION    . FINGER SURGERY Left    thumb surgery; "took bone out; put tendon in"  . TONSILLECTOMY AND ADENOIDECTOMY      There were no vitals filed for this visit.  Subjective Assessment - 05/27/18 1420    Subjective  Pt states he walked for about 1/2 hour yesterday and had cane with him but did not use ; states he hasn't used it much for past few days    Patient Stated Goals  Pt's goals for PT are to get back to normal, do things around the house,  and drive    Currently in Pain?  No/denies                       [facility] Adult PT Treatment/Exercise - 05/27/18 0001      Ambulation/Gait   Ambulation/Gait  Yes    Ambulation/Gait Assistance  4: Min guard    Ambulation Distance (Feet)  350 Feet    Assistive device  None    Gait Pattern  Within Functional Limits    Ambulation Surface  Level;Indoor    Gait Comments  Pt amb. tossing/catching ball & naming animals A-Z for multi-tasking with gait - needed < 5 cues for naming animals through alphabet with no LOB with tossing ball       High Level Balance   High Level Balance Activities  Side stepping;Backward walking;Head turns;Sudden stops;Other (comment) crossovers front and back inside //bars with UE support prn          Balance Exercises - 05/27/18 1424      Balance Exercises: Standing   Standing Eyes Opened  Narrow base of support (BOS);Head turns;Foam/compliant surface;5 reps;Wide (BOA) on 2 pillows in corner - horizontal & vertical    Standing  Eyes Closed  Narrow base of support (BOS);Wide (BOA);Head turns;Foam/compliant surface;5 reps horizontal & vertical head turns    SLS  Eyes open;Foam/compliant surface;Other (comment) on Bosu inside // bars with UE support    Balance Beam  walking tandem on blue balance beam inside // bars iwth UE support prn    Gait with Head Turns  Forward;Other (comment) approx. 20' around track; 50' vertical    Other Standing Exercises  Pt stood on blue Airex - rolled ball up/back and laterally 10 reps each LE - with UE support prn      Pt performed marching on blue mat on incline/decline - with EO and then with EC; initially no head movement with pt Focusing on object straight ahead; then progressed to horizontal and vertical head turns - 5 reps each with EO and then with EC  Alternate stepping up/back and then down/back on incline/decline - added horizontal head turns with min to mod assist for balance recovery  Stepping over and back  of balance beam while standing on blue Airex - 5 reps each LE  x1 viewing exercise performed with pt standing on 2 pillows - 60 secs horizontally and then 60 secs vertically - chair in front for UE support prn    PT Short Term Goals - 05/07/18 1111      PT SHORT TERM GOAL #1   Title  Pt will be independent with HEP for improved balance and gait.  TARGET 05/08/18    Baseline  05/07/18: met today    Time  --    Period  --    Status  Achieved      PT SHORT TERM GOAL #2   Title  Pt will improve TUG score to less than or equal to 13.5 seconds for decreased fall risk.    Baseline  met on 05/05/18    Status  Achieved      PT SHORT TERM GOAL #3   Title  Pt will improve Berg score to at least 51/56 for decreased fall risk.    Baseline  met on 05/05/18    Status  Achieved      PT SHORT TERM GOAL #4   Title  Pt will improve Dynamic Gait Index score to at least 13/24 for decreased fall risk.    Baseline  met on 05/05/18    Status  Achieved      PT SHORT TERM GOAL #5   Title  Pt will negotiate 2 steps no rail, independently for safe entry/exit into home.    Baseline  05/07/18; met today    Time  --    Period  --    Status  Achieved        PT Long Term Goals - 05/05/18 2116      PT LONG TERM GOAL #1   Title  Pt will verbalize understanding of fall prevention in home environment.  TARGET 06/05/18    Time  9    Period  Weeks    Status  New      PT LONG TERM GOAL #2   Title  Pt will improve DGI score to at least 19/24 for decreased fall risk.    Time  9    Period  Weeks    Status  Achieved      PT LONG TERM GOAL #3   Title  Pt will improve gait velocity to at least 2.62 ft/sec for improved community gait efficiency and safety.    Time  9    Period  Weeks    Status  New      PT LONG TERM GOAL #4   Title  Pt will ambulate at least 1000 ft, indoor and outdoor gait, with least restrictive assistive device, modified independently, for improved safety with community gait.    Time  9     Period  Weeks    Status  New      PT LONG TERM GOAL #5   Title  Pt will improve FOTO score by at least 20% for pt-reported improved functional mobility at discharge.    Time  9    Period  Weeks    Status  New            Plan - 05/27/18 1430    Clinical Impression Statement  Pt had signficant LOB with standing activities on compliant surface with EC, indicative of decreased vestibular function and input in maintaining balance.  Pt improved with practice and repetition.  Pt had more difficulty with maintaining balance with horizontal head turns than with vertical head turns.  Pt did not use cane during treatment session, demonstrating improved balance overall.      Rehab Potential  Good    PT Frequency  2x / week    PT Treatment/Interventions  ADLs/Self Care Home Management;DME Instruction;Gait training;Stair training;Functional mobility training;Therapeutic activities;Therapeutic exercise;Balance training;Patient/family education;Neuromuscular re-education;Vestibular    PT Next Visit Plan  Continue compliant surfaces, indoor and outdoor gait training; visual exercises (gaze staiblization varied positions)    Consulted and Agree with Plan of Care  Patient       Patient will benefit from skilled therapeutic intervention in order to improve the following deficits and impairments:  Abnormal gait, Decreased activity tolerance, Decreased balance, Decreased coordination, Decreased mobility, Difficulty walking, Impaired vision/preception  Visit Diagnosis: Unsteadiness on feet  Other abnormalities of gait and mobility     Problem List Patient Active Problem List   Diagnosis Date Noted  . Acute CVA (cerebrovascular accident) ([place]) 03/26/2018  . Obesity (BMI 30.0-34.9) 03/26/2018  . Hyperlipidemia 03/26/2018  . OSA on CPAP 03/26/2018  . Hypertension 03/26/2018  . GERD (gastroesophageal reflux disease) 03/26/2018  . Leukocytosis 03/26/2018  . Acute hyperglycemia 03/26/2018     DildayJenness Corner, PT 05/27/2018, 2:34 PM  [facility] [address] [place], [place], [address] Phone: [phone]   Fax:  [phone]  Name: Chris [name] MRN: [id number] Date of Birth: 03-08-1946

## 2018-05-28 ENCOUNTER — Encounter: Payer: Medicare HMO | Admitting: Occupational Therapy

## 2018-05-28 ENCOUNTER — Ambulatory Visit: Payer: Medicare HMO | Admitting: Physical Therapy

## 2018-05-28 DIAGNOSIS — R2681 Unsteadiness on feet: Secondary | ICD-10-CM

## 2018-05-28 DIAGNOSIS — R2689 Other abnormalities of gait and mobility: Secondary | ICD-10-CM | POA: Diagnosis not present

## 2018-05-28 NOTE — Therapy (Signed)
[facility] [address] [place], [place], [address] Phone: [phone]   Fax:  [phone]  Physical Therapy Treatment  Patient Details  Name: Chris [name] MRN: [id number] Date of Birth: Sep 10, 1946 Referring Provider: Dr. Leonie Man   Encounter Date: 05/28/2018  PT End of Session - 05/28/18 2315    Visit Number  13    Number of Visits  18    Date for PT Re-Evaluation  07/07/18    Authorization Type  [employer]na Medicare    PT Start Time  1106    PT Stop Time  1145    PT Time Calculation (min)  39 min    Equipment Utilized During Treatment  Gait belt    Activity Tolerance  Patient tolerated treatment well    Behavior During Therapy  [facility] for tasks assessed/performed       Past Medical History:  Diagnosis Date  . Acute CVA (cerebrovascular accident) ([place]) 03/25/2018   "double vision and balance issues" (03/26/2018)  . ADD (attention deficit disorder)   . Arthritis    "thumbs and fingers" (03/26/2018)  . Diverticulosis   . GERD (gastroesophageal reflux disease)   . Heart murmur    "when I was young; not noted since age 30" (03/26/2018)  . History of kidney stones   . Hyperlipidemia   . Hypertension   . IFG (impaired fasting glucose)   . Metabolic syndrome   . OSA on CPAP   . Pneumonia    "a time or 2" (03/26/2018)  . PONV (postoperative nausea and vomiting)   . Shingles ~ 2002  . TIA (transient ischemic attack) 2013    Past Surgical History:  Procedure Laterality Date  . CARPAL TUNNEL RELEASE Right   . CYSTOSCOPY W/ STONE MANIPULATION    . FINGER SURGERY Left    thumb surgery; "took bone out; put tendon in"  . TONSILLECTOMY AND ADENOIDECTOMY      There were no vitals filed for this visit.  Subjective Assessment - 05/28/18 2306    Subjective  Is trying to carry the cane, not really use it as much.  Still have trouble with focusing with head turns, causing me to lose balance-that's the main thing.    Patient Stated  Goals  Pt's goals for PT are to get back to normal, do things around the house, and drive    Currently in Pain?  No/denies                       [facility] Adult PT Treatment/Exercise - 05/28/18 0001      Ambulation/Gait   Ambulation/Gait  Yes    Ambulation/Gait Assistance  4: Min guard    Ambulation/Gait Assistance Details  Gait with head turns to look for cards, gait with trunk rotation to pass/take ball while walking    Ambulation Distance (Feet)  230 Feet x 2    Assistive device  None    Gait Pattern  Step-through pattern;Narrow base of support    Ambulation Surface  Level;Indoor    Gait Comments  With above tasks, pt needs occasional cues for relaxed arm swing with gait; gait with ball toss/catch with task to name restaurants in [place], 200 ft, no LOB.          Balance Exercises - 05/28/18 2308      Balance Exercises: Standing   Standing Eyes Closed  Wide (BOA);Narrow base of support (BOS);Head turns;5 reps Head nods on Incline, Decline of ramp  Balance Beam  walking tandem on blue balance beam inside // bars iwth UE support prn.  On balance beam:  marching in place x 20 reps, alternating forward step taps x 10 forward, x 10 back, alternating kicks x 10 reps, with intermittent UE support 4 reps    Gait with Head Turns  Forward;Other (comment) head turns around track, 50 ft x 2    Partial Tandem Stance  Eyes open;5 reps Head turns/nods, on Incline, decline of ramp    Sidestepping  Foam/compliant support;3 reps;Upper extremity support then 2 reps foam surface up and down ramp    Other Standing Exercises  On incline and decline of ramp, Marching in place x 10 reps, then marching in place with head turns x 10, then alternating forward step taps with min guard/supervision          PT Short Term Goals - 05/07/18 1111      PT SHORT TERM GOAL #1   Title  Pt will be independent with HEP for improved balance and gait.  TARGET 05/08/18    Baseline  05/07/18: met today     Time  --    Period  --    Status  Achieved      PT SHORT TERM GOAL #2   Title  Pt will improve TUG score to less than or equal to 13.5 seconds for decreased fall risk.    Baseline  met on 05/05/18    Status  Achieved      PT SHORT TERM GOAL #3   Title  Pt will improve Berg score to at least 51/56 for decreased fall risk.    Baseline  met on 05/05/18    Status  Achieved      PT SHORT TERM GOAL #4   Title  Pt will improve Dynamic Gait Index score to at least 13/24 for decreased fall risk.    Baseline  met on 05/05/18    Status  Achieved      PT SHORT TERM GOAL #5   Title  Pt will negotiate 2 steps no rail, independently for safe entry/exit into home.    Baseline  05/07/18; met today    Time  --    Period  --    Status  Achieved        PT Long Term Goals - 05/05/18 2116      PT LONG TERM GOAL #1   Title  Pt will verbalize understanding of fall prevention in home environment.  TARGET 06/05/18    Time  9    Period  Weeks    Status  New      PT LONG TERM GOAL #2   Title  Pt will improve DGI score to at least 19/24 for decreased fall risk.    Time  9    Period  Weeks    Status  Achieved      PT LONG TERM GOAL #3   Title  Pt will improve gait velocity to at least 2.62 ft/sec for improved community gait efficiency and safety.    Time  9    Period  Weeks    Status  New      PT LONG TERM GOAL #4   Title  Pt will ambulate at least 1000 ft, indoor and outdoor gait, with least restrictive assistive device, modified independently, for improved safety with community gait.    Time  9    Period  Weeks    Status  New  PT LONG TERM GOAL #5   Title  Pt will improve FOTO score by at least 20% for pt-reported improved functional mobility at discharge.    Time  9    Period  Weeks    Status  New            Plan - 05/28/18 2316    Clinical Impression Statement  Continued to work compliant surfaces, inclines/declines with head movements and EC, with initial difficulty  maintaining balance EC, but pt able to improve with practice/repetition.  With dynamic gait activities, pt experiences LOB with trunk rotation activities and to a lesser extent with head turns to look at cards.  Will continue to address balance and dynamic gait for improved functional mobility.    Rehab Potential  Good    PT Frequency  2x / week    PT Treatment/Interventions  ADLs/Self Care Home Management;DME Instruction;Gait training;Stair training;Functional mobility training;Therapeutic activities;Therapeutic exercise;Balance training;Patient/family education;Neuromuscular re-education;Vestibular    PT Next Visit Plan  Continue compliant surfaces, indoor and outdoor gait training; visual exercises (gaze staiblization varied positions); environmental scanning with gait activities    Consulted and Agree with Plan of Care  Patient       Patient will benefit from skilled therapeutic intervention in order to improve the following deficits and impairments:  Abnormal gait, Decreased activity tolerance, Decreased balance, Decreased coordination, Decreased mobility, Difficulty walking, Impaired vision/preception  Visit Diagnosis: Unsteadiness on feet  Other abnormalities of gait and mobility     Problem List Patient Active Problem List   Diagnosis Date Noted  . Acute CVA (cerebrovascular accident) ([place] [name]) 03/26/2018  . Obesity (BMI 30.0-34.9) 03/26/2018  . Hyperlipidemia 03/26/2018  . OSA on CPAP 03/26/2018  . Hypertension 03/26/2018  . GERD (gastroesophageal reflux disease) 03/26/2018  . Leukocytosis 03/26/2018  . Acute hyperglycemia 03/26/2018    Pamela Intrieri W. 05/28/2018, 11:19 PM  Frazier Butt., PT   [facility] [address] [place], [place], [address] Phone: [phone]   Fax:  [phone]  Name: Chris [name] MRN: [id number] Date of Birth: 07-May-1946

## 2018-06-02 ENCOUNTER — Ambulatory Visit: Payer: Medicare HMO | Admitting: Physical Therapy

## 2018-06-02 ENCOUNTER — Encounter: Payer: Medicare HMO | Admitting: Occupational Therapy

## 2018-06-02 ENCOUNTER — Encounter: Payer: Self-pay | Admitting: Internal Medicine

## 2018-06-02 ENCOUNTER — Ambulatory Visit: Payer: Medicare HMO | Admitting: Internal Medicine

## 2018-06-02 VITALS — BP 142/78 | HR 76 | Ht 70.0 in | Wt 204.2 lb

## 2018-06-02 DIAGNOSIS — Z1211 Encounter for screening for malignant neoplasm of colon: Secondary | ICD-10-CM

## 2018-06-02 DIAGNOSIS — R2681 Unsteadiness on feet: Secondary | ICD-10-CM | POA: Diagnosis not present

## 2018-06-02 DIAGNOSIS — K219 Gastro-esophageal reflux disease without esophagitis: Secondary | ICD-10-CM

## 2018-06-02 DIAGNOSIS — R2689 Other abnormalities of gait and mobility: Secondary | ICD-10-CM | POA: Diagnosis not present

## 2018-06-02 DIAGNOSIS — R1310 Dysphagia, unspecified: Secondary | ICD-10-CM | POA: Diagnosis not present

## 2018-06-02 NOTE — Progress Notes (Signed)
HISTORY OF PRESENT ILLNESS:  Chris [name] is a 72 y.o. male , retired Immunologist, with past medical history as listed below who was evaluated in this office 03/09/2018 regarding chronic GERD with intermittent solid food dysphagia and the need for follow-up screening colonoscopy. See that dictation for details.The patienttold to continue pantoprazole 40 mg daily, and tentatively scheduled for screening colonoscopy and upper endoscopy with probable esophageal dilation. Unfortunately, the following month he suffered a stroke. He is now on Plavix. Though improving, he does have residual nausea and dizziness. Due to his interval change in clinical history and current need for Plavix, this appointment made. First, the patient reports that his reflux symptoms are absent with once daily pantoprazole. He is pleased. He also reports that his long-standing intermittent solid food dysphagia has also improved on PPI. His GI review of systems is otherwise negative except for intermittent loose stools. No bleeding. He is not sure how long he will need to be on Plavix.  REVIEW OF SYSTEMS:  All non-GI ROS negative except for sinus and allergy, back pain, visual change, voice change  Past Medical History:  Diagnosis Date  . Acute CVA (cerebrovascular accident) ([place]) 03/25/2018   "double vision and balance issues" (03/26/2018)  . ADD (attention deficit disorder)   . Arthritis    "thumbs and fingers" (03/26/2018)  . Diverticulosis   . GERD (gastroesophageal reflux disease)   . Heart murmur    "when I was young; not noted since age 8" (03/26/2018)  . History of kidney stones   . Hyperlipidemia   . Hypertension   . IFG (impaired fasting glucose)   . Metabolic syndrome   . OSA on CPAP   . Pneumonia    "a time or 2" (03/26/2018)  . PONV (postoperative nausea and vomiting)   . Shingles ~ 2002  . TIA (transient ischemic attack) 2013    Past Surgical History:  Procedure Laterality Date  . CARPAL TUNNEL  RELEASE Right   . CYSTOSCOPY W/ STONE MANIPULATION    . FINGER SURGERY Left    thumb surgery; "took bone out; put tendon in"  . TONSILLECTOMY AND ADENOIDECTOMY      Social History Chris [name]  reports that he has never smoked. He has never used smokeless tobacco. He reports that he does not drink alcohol or use drugs.  family history includes Prostate cancer in his father.  Allergies  Allergen Reactions  . Morphine And Related Nausea And Vomiting       PHYSICAL EXAMINATION: Vital signs: BP (!) 142/78   [name] 74   Ht 5\' 10"  (1.778 m)   Wt 204 lb 4 oz (92.6 kg)   BMI 29.31 kg/m   Constitutional: generally well-appearing, no acute distress Psychiatric: alert and oriented x3, cooperative Abdomen: not reexamined Neuro: alert and oriented  ASSESSMENT:  #1. Chronic GERD. Symptoms controlled with once daily PPI #2. Chronic intermittent solid food dysphagia. Improved on daily PPI. Suspect that he had a significant component of esophageal edema now improved with PPI. May have underlying mild stricture as well #3. Colon cancer screening. Colonoscopy 2009 with diverticulosis and hyperplastic polyp. Appropriate time for routine repeat screening, save recent stroke #4. Recent stroke now on Plavix  PLAN:  #1. Continue PPI #2. We discussed colon cancer screening strategies including stool studies such as FIT, Cologuard. We also discussed optical colonoscopy. At this point, the patient has elected to be reevaluated in about 6 months. If clinically stable, we can repeat visit his workup  and evaluation. I discussed with him the issues related to Plavix therapy and endoscopic procedures. He understands. He also agrees to contact this office in the interim should he develop any relevant interval gastrointestinal symptoms or signs. In the interim he will resume his general medical care with Dr. Brigitte [name].  20 minutes spent face-to-face with the patient. Greater than 50% a time use for counseling  regarding his chronic GERD, intermittent dysphagia, and a full imbalance discussion regarding colon cancer screening as it pertains to his personal clinical status

## 2018-06-02 NOTE — Therapy (Signed)
[facility] [address] [place], [place], [address] Phone: [phone]   Fax:  [phone]  Physical Therapy Treatment  Patient Details  Name: Chris [name] MRN: [id number] Date of Birth: [birth year] Referring Provider: Dr. Leonie Man   Encounter Date: 06/02/2018  PT End of Session - 06/02/18 1502    Visit Number  14    Number of Visits  18    Date for PT Re-Evaluation  07/07/18    Authorization Type  [employer]na Medicare    PT Start Time  1443    PT Stop Time  1444    PT Time Calculation (min)  40 min    Equipment Utilized During Treatment  Gait belt    Activity Tolerance  Patient tolerated treatment well    Behavior During Therapy  [facility] for tasks assessed/performed       Past Medical History:  Diagnosis Date  . Acute CVA (cerebrovascular accident) ([place]) 03/25/2018   "double vision and balance issues" (03/26/2018)  . ADD (attention deficit disorder)   . Arthritis    "thumbs and fingers" (03/26/2018)  . Diverticulosis   . GERD (gastroesophageal reflux disease)   . Heart murmur    "when I was young; not noted since age 56" (03/26/2018)  . History of kidney stones   . Hyperlipidemia   . Hypertension   . IFG (impaired fasting glucose)   . Metabolic syndrome   . OSA on CPAP   . Pneumonia    "a time or 2" (03/26/2018)  . PONV (postoperative nausea and vomiting)   . Shingles ~ 2002  . TIA (transient ischemic attack) 2013    Past Surgical History:  Procedure Laterality Date  . CARPAL TUNNEL RELEASE Right   . CYSTOSCOPY W/ STONE MANIPULATION    . FINGER SURGERY Left    thumb surgery; "took bone out; put tendon in"  . TONSILLECTOMY AND ADENOIDECTOMY      There were no vitals filed for this visit.                    [facility] Adult PT Treatment/Exercise - 06/02/18 0001      Ambulation/Gait   Ambulation/Gait  Yes    Ambulation/Gait Assistance  5: Supervision    Ambulation/Gait Assistance Details  In gym,  gait with head turns to look at cards, to pass ball and take ball from PT (in gym area 230 ft, 400 ft)    Ambulation Distance (Feet)  800 Feet outdoors, including grass, gravel, obstacles, turns    Assistive device  None    Gait Pattern  Step-through pattern;Narrow base of support;Poor foot clearance - left;Poor foot clearance - right outdoor surfaces-needs cues for incr. foot clearance    Ambulation Surface  Level;Indoor    Gait Comments  With above tasks, pt needs occasional cues for relaxed arm swing with gait; gait with ball toss/catch with conversation tasks, no overt LOB., occasional veering R and L, but pt able to self-correct.  Gait with ball circles with head movements to follow ball.  Pt has more veering with gait and head movements/ball movements, than with keeping head steady with ball movements.          Balance Exercises - 06/02/18 1415      Balance Exercises: Standing   Standing Eyes Closed  --    Wall Bumps  Hip    Wall Bumps-Hips  Eyes opened;Anterior/posterior;10 reps 2 sets, in parallel bars    Balance Beam  walking  tandem on blue balance beam 4 reps forward and back inside // bars iwth UE support prn.  On balance beam:  marching in place x 20 reps, alternating forward step taps x 10, alternating back step taps x 10, with intermittent UE support    Sidestepping  Foam/compliant support;3 reps;Upper extremity support then 1 rep with head turns side to side    Heel Raises Limitations  2 sets x 10 reps     Toe Raise Limitations  2 sets x 10 reps          PT Short Term Goals - 05/07/18 1111      PT SHORT TERM GOAL #1   Title  Pt will be independent with HEP for improved balance and gait.  TARGET 05/08/18    Baseline  05/07/18: met today    Time  --    Period  --    Status  Achieved      PT SHORT TERM GOAL #2   Title  Pt will improve TUG score to less than or equal to 13.5 seconds for decreased fall risk.    Baseline  met on 05/05/18    Status  Achieved      PT  SHORT TERM GOAL #3   Title  Pt will improve Berg score to at least 51/56 for decreased fall risk.    Baseline  met on 05/05/18    Status  Achieved      PT SHORT TERM GOAL #4   Title  Pt will improve Dynamic Gait Index score to at least 13/24 for decreased fall risk.    Baseline  met on 05/05/18    Status  Achieved      PT SHORT TERM GOAL #5   Title  Pt will negotiate 2 steps no rail, independently for safe entry/exit into home.    Baseline  05/07/18; met today    Time  --    Period  --    Status  Achieved        PT Long Term Goals - 05/05/18 2116      PT LONG TERM GOAL #1   Title  Pt will verbalize understanding of fall prevention in home environment.  TARGET 06/05/18    Time  9    Period  Weeks    Status  New      PT LONG TERM GOAL #2   Title  Pt will improve DGI score to at least 19/24 for decreased fall risk.    Time  9    Period  Weeks    Status  Achieved      PT LONG TERM GOAL #3   Title  Pt will improve gait velocity to at least 2.62 ft/sec for improved community gait efficiency and safety.    Time  9    Period  Weeks    Status  New      PT LONG TERM GOAL #4   Title  Pt will ambulate at least 1000 ft, indoor and outdoor gait, with least restrictive assistive device, modified independently, for improved safety with community gait.    Time  9    Period  Weeks    Status  New      PT LONG TERM GOAL #5   Title  Pt will improve FOTO score by at least 20% for pt-reported improved functional mobility at discharge.    Time  9    Period  Weeks    Status  New  Plan - 06/02/18 1502    Clinical Impression Statement  Dynamic balance activities in parallel bars on compliant surfaces with intermittent UE support and gait activities performed with no cane today.  Pt is progressing with dynamic gait activities, able to self-correct balance with decreased left and right veering today.  Pt needs cueing for increased foot clearance on outdoor surfaces.    Rehab  Potential  Good    PT Frequency  2x / week    PT Treatment/Interventions  ADLs/Self Care Home Management;DME Instruction;Gait training;Stair training;Functional mobility training;Therapeutic activities;Therapeutic exercise;Balance training;Patient/family education;Neuromuscular re-education;Vestibular    PT Next Visit Plan  Review of HEP and updates/progression as needed; environmental scanning and dynamic gait activities; begin looking at goals and plan for d/c next week    Consulted and Agree with Plan of Care  Patient       Patient will benefit from skilled therapeutic intervention in order to improve the following deficits and impairments:  Abnormal gait, Decreased activity tolerance, Decreased balance, Decreased coordination, Decreased mobility, Difficulty walking, Impaired vision/preception  Visit Diagnosis: Other abnormalities of gait and mobility  Unsteadiness on feet     Problem List Patient Active Problem List   Diagnosis Date Noted  . Acute CVA (cerebrovascular accident) ([place]) 03/26/2018  . Obesity (BMI 30.0-34.9) 03/26/2018  . Hyperlipidemia 03/26/2018  . OSA on CPAP 03/26/2018  . Hypertension 03/26/2018  . GERD (gastroesophageal reflux disease) 03/26/2018  . Leukocytosis 03/26/2018  . Acute hyperglycemia 03/26/2018    Arlet Marter W. 06/02/2018, 3:05 PM  Frazier Butt., PT   [facility] [address] [place], [place], [address] Phone: [phone]   Fax:  [phone]  Name: Chris [name] MRN: [id number] Date of Birth: [birth year]

## 2018-06-02 NOTE — Patient Instructions (Signed)
Call our office at the end of the year to plan to see Dr. Henrene Pastor after the new year.  In the meantime follow up as needed

## 2018-06-03 DIAGNOSIS — G4733 Obstructive sleep apnea (adult) (pediatric): Secondary | ICD-10-CM | POA: Diagnosis not present

## 2018-06-04 ENCOUNTER — Ambulatory Visit: Payer: Medicare HMO | Admitting: Physical Therapy

## 2018-06-04 ENCOUNTER — Encounter: Payer: Medicare HMO | Admitting: Occupational Therapy

## 2018-06-04 ENCOUNTER — Encounter: Payer: Self-pay | Admitting: Physical Therapy

## 2018-06-04 DIAGNOSIS — R2689 Other abnormalities of gait and mobility: Secondary | ICD-10-CM | POA: Diagnosis not present

## 2018-06-04 DIAGNOSIS — R2681 Unsteadiness on feet: Secondary | ICD-10-CM | POA: Diagnosis not present

## 2018-06-04 NOTE — Patient Instructions (Addendum)
Braiding    Move to side: 1) cross right leg in front of left, 2) bring back leg out to side, then 3) cross right leg behind left, 4) bring left leg out to side. Continue sequence in same direction. Reverse sequence, moving in opposite direction. Repeat sequence length of counter or hallway, __3__ times each direction, per session. Do __1__ session per day.   Copyright  VHI. All rights reserved.  Backward Walking    Walk backward, toes of each foot coming down first. Take long, even strides. Make sure you have a clear pathway with no obstructions when you do this.  Then smoothly transition to forward walking.  Repeat this the length of the hallway or counter, 3 reps.   Copyright  VHI. All rights reserved.

## 2018-06-04 NOTE — Therapy (Signed)
[facility] [address] [place], [place], [address] Phone: [phone]   Fax:  [phone]  Physical Therapy Treatment  Patient Details  Name: Chris [name] MRN: [id number] Date of Birth: 1946/12/05 Referring Provider: Dr. Leonie Man   Encounter Date: 06/04/2018  PT End of Session - 06/04/18 2028    Visit Number  15    Number of Visits  18    Date for PT Re-Evaluation  07/07/18    Authorization Type  Aetna Medicare    PT Start Time  0932    PT Stop Time  1013    PT Time Calculation (min)  41 min    Activity Tolerance  Patient tolerated treatment well    Behavior During Therapy  [facility] for tasks assessed/performed       Past Medical History:  Diagnosis Date  . Acute CVA (cerebrovascular accident) ([place]) 03/25/2018   "double vision and balance issues" (03/26/2018)  . ADD (attention deficit disorder)   . Arthritis    "thumbs and fingers" (03/26/2018)  . Diverticulosis   . GERD (gastroesophageal reflux disease)   . Heart murmur    "when I was young; not noted since age 72" (03/26/2018)  . History of kidney stones   . Hyperlipidemia   . Hypertension   . IFG (impaired fasting glucose)   . Metabolic syndrome   . OSA on CPAP   . Pneumonia    "a time or 2" (03/26/2018)  . PONV (postoperative nausea and vomiting)   . Shingles ~ 2002  . TIA (transient ischemic attack) 2013    Past Surgical History:  Procedure Laterality Date  . CARPAL TUNNEL RELEASE Right   . CYSTOSCOPY W/ STONE MANIPULATION    . FINGER SURGERY Left    thumb surgery; "took bone out; put tendon in"  . TONSILLECTOMY AND ADENOIDECTOMY      There were no vitals filed for this visit.  Subjective Assessment - 06/04/18 0933    Subjective  Walked in today without the cane-trying without it.      Patient Stated Goals  Pt's goals for PT are to get back to normal, do things around the house, and drive    Currently in Pain?  No/denies                        [facility] Adult PT Treatment/Exercise - 06/04/18 0001      Ambulation/Gait   Ambulation/Gait  Yes    Ambulation/Gait Assistance  7: Independent    Ambulation/Gait Assistance Details  Pt does not have cane today, and reports he feels more balanced without having to carry cane.    Ambulation Distance (Feet)  1000 Feet outdoors, 200 ft indoors    Assistive device  None    Gait Pattern  Step-through pattern;Poor foot clearance - right    Ambulation Surface  Level;Unlevel;Indoor;Outdoor    [employer]  Environmental scanning tasks outdoors on sidewalk, occasional R veering, no LOB.    Gait Comments  Treadmill gait 1.6>2.2 mph, with light bilateral UE support x 5 minutes, to prepare patient for return to possible treadmill use in gym.  Cues provided for increased R foot clearance and heelstike while on treadmill.      High Level Balance   High Level Balance Activities  Backward walking;Tandem walking;Braiding along hallway and counter, multiple reps    High Level Balance Comments  Worked on transition between forward/back walking for improved balance with transition from back>forward  directions.      Self-Care   Self-Care  Other Self-Care Comments    Other Self-Care Comments   Discussed options for continued community fitness, including return to [employer] [facility] [employer] program for [employer]. Recommended pt formulate plan for community fitness and report back at next visit.             PT Education - 06/04/18 2027    Education provided  Yes    Education Details  Braiding and backwards/forwards walking added to HEP (d/c sidestepping at home); discussed return to community fitness options    Person(s) Educated  Patient    Methods  Explanation;Demonstration;Handout    Comprehension  Verbalized understanding;Returned demonstration       PT Short Term Goals - 05/07/18 1111      PT SHORT TERM GOAL #1   Title  Pt will be independent with HEP  for improved balance and gait.  TARGET 05/08/18    Baseline  05/07/18: met today    Time  --    Period  --    Status  Achieved      PT SHORT TERM GOAL #2   Title  Pt will improve TUG score to less than or equal to 13.5 seconds for decreased fall risk.    Baseline  met on 05/05/18    Status  Achieved      PT SHORT TERM GOAL #3   Title  Pt will improve Berg score to at least 51/56 for decreased fall risk.    Baseline  met on 05/05/18    Status  Achieved      PT SHORT TERM GOAL #4   Title  Pt will improve Dynamic Gait Index score to at least 13/24 for decreased fall risk.    Baseline  met on 05/05/18    Status  Achieved      PT SHORT TERM GOAL #5   Title  Pt will negotiate 2 steps no rail, independently for safe entry/exit into home.    Baseline  05/07/18; met today    Time  --    Period  --    Status  Achieved        PT Long Term Goals - 06/04/18 2028      PT LONG TERM GOAL #1   Title  Pt will verbalize understanding of fall prevention in home environment.  TARGET 06/05/18    Time  9    Period  Weeks    Status  New      PT LONG TERM GOAL #2   Title  Pt will improve DGI score to at least 19/24 for decreased fall risk.    Time  9    Period  Weeks    Status  Achieved      PT LONG TERM GOAL #3   Title  Pt will improve gait velocity to at least 2.62 ft/sec for improved community gait efficiency and safety.    Time  9    Period  Weeks    Status  New      PT LONG TERM GOAL #4   Title  Pt will ambulate at least 1000 ft, indoor and outdoor gait, with least restrictive assistive device, modified independently, for improved safety with community gait.    Time  9    Period  Weeks    Status  Achieved      PT LONG TERM GOAL #5   Title  Pt will improve FOTO score by at  least 20% for pt-reported improved functional mobility at discharge.    Time  9    Period  Weeks    Status  New            Plan - 06/04/18 2028    Clinical Impression Statement  Pt has met LTG 4 for long  distance outdoor gait with no cane at all today.  Updated HEP to include dynamic balance activities of braiding and forwards/back walking.  Pt is progressing towards goals, with likely discharge next visit.    Rehab Potential  Good    PT Frequency  2x / week    PT Treatment/Interventions  ADLs/Self Care Home Management;DME Instruction;Gait training;Stair training;Functional mobility training;Therapeutic activities;Therapeutic exercise;Balance training;Patient/family education;Neuromuscular re-education;Vestibular    PT Next Visit Plan  Review HEP given this visit; check remaining goals and plans for community fitness; plan for d/c (need to do FOTO?)    Consulted and Agree with Plan of Care  Patient       Patient will benefit from skilled therapeutic intervention in order to improve the following deficits and impairments:  Abnormal gait, Decreased activity tolerance, Decreased balance, Decreased coordination, Decreased mobility, Difficulty walking, Impaired vision/preception  Visit Diagnosis: Other abnormalities of gait and mobility  Unsteadiness on feet     Problem List Patient Active Problem List   Diagnosis Date Noted  . Acute CVA (cerebrovascular accident) ([place]) 03/26/2018  . Obesity (BMI 30.0-34.9) 03/26/2018  . Hyperlipidemia 03/26/2018  . OSA on CPAP 03/26/2018  . Hypertension 03/26/2018  . GERD (gastroesophageal reflux disease) 03/26/2018  . Leukocytosis 03/26/2018  . Acute hyperglycemia 03/26/2018    Law Corsino W. 06/04/2018, 8:30 PM  Frazier Butt., PT   [facility] [address] [place], [place], [address] Phone: [phone]   Fax:  [phone]  Name: BRANNON LEVENE MRN: [id number] Date of Birth: [birth year]

## 2018-06-08 ENCOUNTER — Encounter: Payer: Medicare HMO | Admitting: Occupational Therapy

## 2018-06-08 ENCOUNTER — Ambulatory Visit: Payer: Medicare HMO | Admitting: Physical Therapy

## 2018-06-08 ENCOUNTER — Encounter: Payer: Self-pay | Admitting: Physical Therapy

## 2018-06-08 DIAGNOSIS — R2689 Other abnormalities of gait and mobility: Secondary | ICD-10-CM

## 2018-06-08 DIAGNOSIS — R2681 Unsteadiness on feet: Secondary | ICD-10-CM

## 2018-06-08 NOTE — Therapy (Signed)
[facility] [address] [place], [place], [address] Phone: [phone]   Fax:  [phone]  Physical Therapy Treatment  Patient Details  Name: Chris [name] MRN: [id number] Date of Birth: [birth year] Referring Provider: Dr. Leonie Man   Encounter Date: 06/08/2018  PT End of Session - 06/08/18 1047    Visit Number  16    Number of Visits  18    Date for PT Re-Evaluation  07/07/18    Authorization Type  Aetna Medicare    PT Start Time  1017    PT Stop Time  1042    PT Time Calculation (min)  25 min    Activity Tolerance  Patient tolerated treatment well    Behavior During Therapy  [facility] for tasks assessed/performed       Past Medical History:  Diagnosis Date  . Acute CVA (cerebrovascular accident) ([place]) 03/25/2018   "double vision and balance issues" (03/26/2018)  . ADD (attention deficit disorder)   . Arthritis    "thumbs and fingers" (03/26/2018)  . Diverticulosis   . GERD (gastroesophageal reflux disease)   . Heart murmur    "when I was young; not noted since age 75" (03/26/2018)  . History of kidney stones   . Hyperlipidemia   . Hypertension   . IFG (impaired fasting glucose)   . Metabolic syndrome   . OSA on CPAP   . Pneumonia    "a time or 2" (03/26/2018)  . PONV (postoperative nausea and vomiting)   . Shingles ~ 2002  . TIA (transient ischemic attack) 2013    Past Surgical History:  Procedure Laterality Date  . CARPAL TUNNEL RELEASE Right   . CYSTOSCOPY W/ STONE MANIPULATION    . FINGER SURGERY Left    thumb surgery; "took bone out; put tendon in"  . TONSILLECTOMY AND ADENOIDECTOMY      There were no vitals filed for this visit.  Subjective Assessment - 06/08/18 1020    Subjective  Have appointment with gym this afternoon for assessment.  No stumbles or falls.    Patient Stated Goals  Pt's goals for PT are to get back to normal, do things around the house, and drive    Currently in Pain?  No/denies                        [facility] Adult PT Treatment/Exercise - 06/08/18 0001      Ambulation/Gait   Ambulation/Gait  Yes    Ambulation/Gait Assistance  7: Independent    Gait velocity  3.53 ft/sec      Standardized Balance Assessment   Standardized Balance Assessment  Timed Up and Go Test      Timed Up and Go Test   TUG  Normal TUG    Normal TUG (seconds)  9.66      High Level Balance   High Level Balance Comments  Reviewed braiding and forward/back walking as part of HEP given last visit-pt return demo understanding.      Self-Care   Self-Care  Other Self-Care Comments    Other Self-Care Comments   Reviewed community fitness options upon d/c from PT-pt reports going later today to gym for assessment to get started.  Discussed fall prevention education and provided handout.  Completed d/c FOTO status, with pt's score 97 (improved from 54 at eval)             PT Education - 06/08/18 1046    Education  provided  Yes    Education Details  REview of HEP; fall prevention, review of community fitness    Person(s) Educated  Patient    Methods  Explanation;Handout    Comprehension  Verbalized understanding       PT Short Term Goals - 05/07/18 1111      PT SHORT TERM GOAL #1   Title  Pt will be independent with HEP for improved balance and gait.  TARGET 05/08/18    Baseline  05/07/18: met today    Time  --    Period  --    Status  Achieved      PT SHORT TERM GOAL #2   Title  Pt will improve TUG score to less than or equal to 13.5 seconds for decreased fall risk.    Baseline  met on 05/05/18    Status  Achieved      PT SHORT TERM GOAL #3   Title  Pt will improve Berg score to at least 51/56 for decreased fall risk.    Baseline  met on 05/05/18    Status  Achieved      PT SHORT TERM GOAL #4   Title  Pt will improve Dynamic Gait Index score to at least 13/24 for decreased fall risk.    Baseline  met on 05/05/18    Status  Achieved      PT SHORT TERM GOAL #5    Title  Pt will negotiate 2 steps no rail, independently for safe entry/exit into home.    Baseline  05/07/18; met today    Time  --    Period  --    Status  Achieved        PT Long Term Goals - 06/08/18 1020      PT LONG TERM GOAL #1   Title  Pt will verbalize understanding of fall prevention in home environment.  TARGET 06/05/18    Time  9    Period  Weeks    Status  Achieved      PT LONG TERM GOAL #2   Title  Pt will improve DGI score to at least 19/24 for decreased fall risk.    Time  9    Period  Weeks    Status  Achieved      PT LONG TERM GOAL #3   Title  Pt will improve gait velocity to at least 2.62 ft/sec for improved community gait efficiency and safety.    Baseline  3.53 ft/sec    Time  9    Period  Weeks    Status  Achieved      PT LONG TERM GOAL #4   Title  Pt will ambulate at least 1000 ft, indoor and outdoor gait, with least restrictive assistive device, modified independently, for improved safety with community gait.    Time  9    Period  Weeks    Status  Achieved      PT LONG TERM GOAL #5   Title  Pt will improve FOTO score by at least 20% for pt-reported improved functional mobility at discharge.    Time  9    Period  Weeks    Status  Achieved            Plan - 06/08/18 1047    Clinical Impression Statement  Pt has met all LTGs.  He has progressed from use of RW to independent walking with no device.  He has improved gait velocity and  balance scores to WNL; FOTO discharge score is 97 (compared to 54 at eval).  He is appropriate for d/c at this time.    Rehab Potential  Good    PT Frequency  2x / week    PT Treatment/Interventions  ADLs/Self Care Home Management;DME Instruction;Gait training;Stair training;Functional mobility training;Therapeutic activities;Therapeutic exercise;Balance training;Patient/family education;Neuromuscular re-education;Vestibular    PT Next Visit Plan  D/C this visit    Consulted and Agree with Plan of Care  Patient        Patient will benefit from skilled therapeutic intervention in order to improve the following deficits and impairments:  Abnormal gait, Decreased activity tolerance, Decreased balance, Decreased coordination, Decreased mobility, Difficulty walking, Impaired vision/preception  Visit Diagnosis: Other abnormalities of gait and mobility  Unsteadiness on feet     Problem List Patient Active Problem List   Diagnosis Date Noted  . Acute CVA (cerebrovascular accident) ([place]) 03/26/2018  . Obesity (BMI 30.0-34.9) 03/26/2018  . Hyperlipidemia 03/26/2018  . OSA on CPAP 03/26/2018  . Hypertension 03/26/2018  . GERD (gastroesophageal reflux disease) 03/26/2018  . Leukocytosis 03/26/2018  . Acute hyperglycemia 03/26/2018    Anica Alcaraz W. 06/08/2018, 10:50 AM  Mady Haagensen, PT 06/08/18 10:50 AM Phone: [phone] Fax: [phone]  PHYSICAL THERAPY DISCHARGE SUMMARY  Visits from Start of Care: 16  Current functional level related to goals / functional outcomes: PT Long Term Goals - 06/08/18 1020      PT LONG TERM GOAL #1   Title  Pt will verbalize understanding of fall prevention in home environment.  TARGET 06/05/18    Time  9    Period  Weeks    Status  Achieved      PT LONG TERM GOAL #2   Title  Pt will improve DGI score to at least 19/24 for decreased fall risk.    Time  9    Period  Weeks    Status  Achieved      PT LONG TERM GOAL #3   Title  Pt will improve gait velocity to at least 2.62 ft/sec for improved community gait efficiency and safety.    Baseline  3.53 ft/sec    Time  9    Period  Weeks    Status  Achieved      PT LONG TERM GOAL #4   Title  Pt will ambulate at least 1000 ft, indoor and outdoor gait, with least restrictive assistive device, modified independently, for improved safety with community gait.    Time  9    Period  Weeks    Status  Achieved      PT LONG TERM GOAL #5   Title  Pt will improve FOTO score by at least 20% for pt-reported  improved functional mobility at discharge.    Time  9    Period  Weeks    Status  Achieved      Pt has met all LTGs-Functional Scores at d/c Merrilee Jansky 51/56, FGA 19/30, DGI 19/24, TUG 9.66, gt velocity 3.5 ft/sec)   Remaining deficits: Occasional unsteadiness with dual tasking, environmental scanning (improving)   Education / Equipment: HEP, fall prevention, community fitness  Plan: Patient agrees to discharge.  Patient goals were met. Patient is being discharged due to meeting the stated rehab goals.  ?????      [facility] [address] [place], [place], [address] Phone: [phone]   Fax:  [phone]  Name: Chris [name] MRN: [id number] Date of Birth: 12-17-1946  Mady Haagensen, PT 06/08/18 10:52 AM Phone: [phone] Fax: [phone]

## 2018-06-08 NOTE — Patient Instructions (Addendum)

## 2018-06-11 ENCOUNTER — Encounter: Payer: Medicare HMO | Admitting: Occupational Therapy

## 2018-06-11 ENCOUNTER — Ambulatory Visit: Payer: Medicare HMO | Admitting: Physical Therapy

## 2018-06-16 ENCOUNTER — Institutional Professional Consult (permissible substitution): Payer: Medicare HMO | Admitting: Neurology

## 2018-07-07 ENCOUNTER — Encounter: Payer: Self-pay | Admitting: Neurology

## 2018-07-07 ENCOUNTER — Ambulatory Visit (INDEPENDENT_AMBULATORY_CARE_PROVIDER_SITE_OTHER): Payer: Medicare HMO | Admitting: Neurology

## 2018-07-07 VITALS — BP 153/78 | HR 65 | Ht 71.0 in | Wt 200.0 lb

## 2018-07-07 DIAGNOSIS — E663 Overweight: Secondary | ICD-10-CM

## 2018-07-07 DIAGNOSIS — G4733 Obstructive sleep apnea (adult) (pediatric): Secondary | ICD-10-CM | POA: Diagnosis not present

## 2018-07-07 DIAGNOSIS — Z8673 Personal history of transient ischemic attack (TIA), and cerebral infarction without residual deficits: Secondary | ICD-10-CM

## 2018-07-07 DIAGNOSIS — Z9989 Dependence on other enabling machines and devices: Secondary | ICD-10-CM

## 2018-07-07 DIAGNOSIS — G478 Other sleep disorders: Secondary | ICD-10-CM | POA: Diagnosis not present

## 2018-07-07 NOTE — Progress Notes (Signed)
Subjective:    Patient ID: Chris [name] is a 72 y.o. male.  HPI     Star Age, MD, PhD [facility] [address]O. [place], [place] [address]  Dear Janett Billow:  I saw your patient, Chris [name], upon your kind request in my clinic today for initial consultation of his sleep disorder, in particular reevaluation of his prior diagnosis of obstructive sleep apnea. The patient is unaccompanied today. As you know, Chris [name] is a 72 year old right-handed gentleman with an underlying medical history of stroke in April 2019, hypertension, hyperlipidemia, impaired fasting glucose, kidney stones, reflux disease, diverticulosis, arthritis, and overweight state, who was previously diagnosed with obstructive sleep apnea and placed on CPAP therapy. Prior sleep study results are not available for my review today. I reviewed your office note from 04/27/2018. A CPAP compliance download from his current machine was not possible. He reports being compliant with CPAP, using it nearly nightly. He does have some issue with air leaking. He has been using a small comfort blue nasal mask. He has tried nasal pillows in the past but did not tolerate them very well. He is not aware of any family history of OSA. His Epworth sleepiness score is 4 out of 24, fatigue score is 27 out of 63. He is retired and lives with his wife, they have 2 children. He is a nonsmoker and does not utilize alcohol, does not drink caffeine on a day-to-day basis. He is a retired Nature conservation officer. He had a TE as a child. His bedtime is typically around 9, he likes to read in bed. He does not typically watch TV in his bedroom. He is typically a sleep by 10 PM, arise time is around 8:30. He does not have any night to night nocturia. He has had occasional morning headaches especially with allergy flareup. He has difficulty tolerating his CPAP and his allergies flare up. He also had a septoplasty in the past. He had  gained weight in the past. Currently he is trying to lose weight and has lost about 15 pounds since his stroke.  His Past Medical History Is Significant For: Past Medical History:  Diagnosis Date  . Acute CVA (cerebrovascular accident) ([place]) 03/25/2018   "double vision and balance issues" (03/26/2018)  . ADD (attention deficit disorder)   . Arthritis    "thumbs and fingers" (03/26/2018)  . Diverticulosis   . GERD (gastroesophageal reflux disease)   . Heart murmur    "when I was young; not noted since age 31" (03/26/2018)  . History of kidney stones   . Hyperlipidemia   . Hypertension   . IFG (impaired fasting glucose)   . Metabolic syndrome   . OSA on CPAP   . Pneumonia    "a time or 2" (03/26/2018)  . PONV (postoperative nausea and vomiting)   . Shingles ~ 2002  . TIA (transient ischemic attack) 2013    His Past Surgical History Is Significant For: Past Surgical History:  Procedure Laterality Date  . CARPAL TUNNEL RELEASE Right   . CYSTOSCOPY W/ STONE MANIPULATION    . FINGER SURGERY Left    thumb surgery; "took bone out; put tendon in"  . TONSILLECTOMY AND ADENOIDECTOMY      His Family History Is Significant For: Family History  Problem Relation Age of Onset  . Prostate cancer Father     His Social History Is Significant For: Social History   Socioeconomic History  . Marital status: Married  Spouse name: Not on file  . Number of children: Not on file  . Years of education: Not on file  . Highest education level: Not on file  Occupational History  . Not on file  Social Needs  . Financial resource strain: Not on file  . Food insecurity:    Worry: Not on file    Inability: Not on file  . Transportation needs:    Medical: Not on file    Non-medical: Not on file  Tobacco Use  . Smoking status: Never Smoker  . Smokeless tobacco: Never Used  Substance and Sexual Activity  . Alcohol use: No    Frequency: Never  . Drug use: Never  . Sexual activity: Not on file   Lifestyle  . Physical activity:    Days per week: Not on file    Minutes per session: Not on file  . Stress: Not on file  Relationships  . Social connections:    Talks on phone: Not on file    Gets together: Not on file    Attends religious service: Not on file    Active member of club or organization: Not on file    Attends meetings of clubs or organizations: Not on file    Relationship status: Not on file  Other Topics Concern  . Not on file  Social History Narrative  . Not on file    His Allergies Are:  Allergies  Allergen Reactions  . Morphine And Related Nausea And Vomiting  :   His Current Medications Are:  Outpatient Encounter Medications as of 07/07/2018  Medication Sig  . atorvastatin (LIPITOR) 40 MG tablet Take 40 mg by mouth daily.  . clopidogrel (PLAVIX) 75 MG tablet Take 1 tablet (75 mg total) by mouth daily.  . fluticasone (FLONASE) 50 MCG/ACT nasal spray Place 1 spray into both nostrils daily as needed for allergies.   . pantoprazole (PROTONIX) 40 MG tablet Take 40 mg by mouth daily.  Marland Kitchen telmisartan (MICARDIS) 80 MG tablet Take 80 mg by mouth daily.   No facility-administered encounter medications on file as of 07/07/2018.   :  Review of Systems:  Out of a complete 14 point review of systems, all are reviewed and negative with the exception of these symptoms as listed below: Is Review of Systems  Neurological:       Pt presents today to discuss his cpap. Pt's cpap is greater than 53 years old and is unable to be downloaded by this office. Pt uses [employer]. Pt reports using his machine every night. Pt's last sleep studies were in the 1990s.  Epworth Sleepiness Scale 0= would never doze 1= slight chance of dozing 2= moderate chance of dozing 3= high chance of dozing  Sitting and reading: 1 Watching TV: 1 Sitting inactive in a public place (ex. Theater or meeting): 0 As a passenger in a car for an hour without a break: 0 Lying down to rest in the  afternoon: 2 Sitting and talking to someone: 0 Sitting quietly after lunch (no alcohol): 0 In a car, while stopped in traffic: 0 Total: 4     Objective:  Neurological Exam  Physical Exam Physical Examination:   Vitals:   07/07/18 1317  BP: (!) 153/78  Pulse: 65    General Examination: The patient is a very pleasant 72 y.o. male in no acute distress. He appears well-developed and well-nourished and well groomed.   HEENT: Normocephalic, atraumatic, pupils are equal, round and reactive to light  and accommodation. Corrective eyeglasses in place. Extraocular tracking is good without limitation to gaze excursion or nystagmus noted. Normal smooth pursuit is noted. Hearing is grossly intact with bilateral hearing aids in place. Face is symmetric with normal facial animation and normal facial sensation. Speech is clear with no dysarthria noted. There is no hypophonia. There is no lip, neck/head, jaw or voice tremor. Neck is supple with full range of passive and active motion. There are no carotid bruits on auscultation. Oropharynx exam reveals: mild mouth dryness, adequate dental hygiene and mild airway crowding, due to smaller airway entry. Tonsils are absent. Mallampati is class II. Neck circumference is 17 inches. Nasal inspection reveals no significant septal deviation or inferior turbinate hypertrophy. Tongue protrudes centrally and palate elevates symmetrically.   Chest: Clear to auscultation without wheezing, rhonchi or crackles noted.  Heart: S1+S2+0, regular and normal without murmurs, rubs or gallops noted.   Abdomen: Soft, non-tender and non-distended with normal bowel sounds appreciated on auscultation.  Extremities: There is no pitting edema in the distal lower extremities bilaterally. Pedal pulses are intact.  Skin: Warm and dry without trophic changes noted. There are no varicose veins.  Musculoskeletal: exam reveals no obvious joint deformities, tenderness or joint swelling  or erythema.   Neurologically:  Mental status: The patient is awake, alert and oriented in all 4 spheres. His immediate and remote memory, attention, language skills and fund of knowledge are appropriate. There is no evidence of aphasia, agnosia, apraxia or anomia. Speech is clear with normal prosody and enunciation. Thought process is linear. Mood is normal and affect is normal.  Cranial nerves II - XII are as described above under HEENT exam. In addition: shoulder shrug is normal with equal shoulder height noted. Motor exam: Normal bulk, strength and tone is noted. There is no drift, tremor or rebound. Romberg is negative. Reflexes are 1+ throughout. Fine motor skills and coordination: intact with normal finger taps, normal hand movements, normal rapid alternating patting, normal foot taps and normal foot agility.  Cerebellar testing: No dysmetria or intention tremor on finger to nose testing. Heel to shin is unremarkable bilaterally. There is no truncal or gait ataxia.  Sensory exam: intact to light touch in the upper and lower extremities.  Gait, station and balance: He stands easily. No veering to one side is noted. No leaning to one side is noted. Posture is age-appropriate and stance is narrow based. Gait shows normal stride length and normal pace. No problems turning are noted. Tandem walk is somewhat challenging for him.  Assessment and Plan:   In summary, KAMARE CASPERS is a very pleasant 72 y.o.-year old male with a history and physical exam concerning for obstructive sleep apnea (OSA). I had a long chat with the patient about my findings and the diagnosis of OSA, its prognosis and treatment options. We talked about medical treatments, surgical interventions and non-pharmacological approaches. I explained in particular the risks and ramifications of untreated moderate to severe OSA, especially with respect to developing cardiovascular disease down the Road, including congestive heart failure,  difficult to treat hypertension, cardiac arrhythmias, or stroke. Even type 2 diabetes has, in part, been linked to untreated OSA. Symptoms of untreated OSA include daytime sleepiness, memory problems, mood irritability and mood disorder such as depression and anxiety, lack of energy, as well as recurrent headaches, especially morning headaches. We talked about trying to maintain a healthy lifestyle in general, as well as the importance of weight control. I encouraged the patient to eat  healthy, exercise daily and keep well hydrated, to keep a scheduled bedtime and wake time routine, to not skip any meals and eat healthy snacks in between meals. I advised the patient not to drive when feeling sleepy. I recommended the following at this time: sleep study with potential positive airway pressure titration. (We will score hypopneas at 4%).   I explained the sleep test procedure to the patient and also outlined possible surgical and non-surgical treatment options of OSA, including the use of a custom-made dental device (which would require a referral to a specialist dentist or oral surgeon), upper airway surgical options, such as pillar implants, radiofrequency surgery, tongue base surgery, and UPPP (which would involve a referral to an ENT surgeon). Rarely, jaw surgery such as mandibular advancement may be considered.  I also explained the CPAP treatment option to the patient, who indicated that he would be willing to continue with CPAP if the need arises. I explained the importance of being compliant with PAP treatment, not only for insurance purposes but primarily to improve His symptoms, and for the patient's long term health benefit, including to reduce His cardiovascular risks. I answered all his questions today and the patient was in agreement. I plan to see him back after the sleep study is completed and encouraged him to call with any interim questions, concerns, problems or updates.   Thank you very much  for allowing me to participate in the care of this nice patient. If I can be of any further assistance to you please do not hesitate to talk to me at [phone].  Sincerely,   Star Age, MD, PhD

## 2018-07-07 NOTE — Patient Instructions (Signed)

## 2018-07-23 ENCOUNTER — Ambulatory Visit (INDEPENDENT_AMBULATORY_CARE_PROVIDER_SITE_OTHER): Payer: Medicare HMO | Admitting: Neurology

## 2018-07-23 DIAGNOSIS — G4761 Periodic limb movement disorder: Secondary | ICD-10-CM

## 2018-07-23 DIAGNOSIS — Z8673 Personal history of transient ischemic attack (TIA), and cerebral infarction without residual deficits: Secondary | ICD-10-CM

## 2018-07-23 DIAGNOSIS — G478 Other sleep disorders: Secondary | ICD-10-CM

## 2018-07-23 DIAGNOSIS — Z9989 Dependence on other enabling machines and devices: Secondary | ICD-10-CM | POA: Diagnosis not present

## 2018-07-23 DIAGNOSIS — G4733 Obstructive sleep apnea (adult) (pediatric): Secondary | ICD-10-CM

## 2018-07-23 DIAGNOSIS — E663 Overweight: Secondary | ICD-10-CM

## 2018-07-23 DIAGNOSIS — G4720 Circadian rhythm sleep disorder, unspecified type: Secondary | ICD-10-CM

## 2018-07-23 NOTE — Progress Notes (Signed)
[facility] [address] [place]. [place] [address] [phone]       OFFICE FOLLOW UP NOTE  Mr. [name] Date of Birth:  July 22, 1946 Medical Record Number:  [id number]   Reason for Referral:  hospital stroke follow up  CHIEF COMPLAINT:  No chief complaint on file.   HPI: Chris [name] is being seen today in the office for probable small brainstem stroke not visualized on MRI and incidental finding of left parietal infarct on 03/25/18. History obtained from patient and chart review. Reviewed all radiology images and labs personally.   Mr. Haik is a 72 year old male with PMH of HTN, HLD, glucose intolerance, OSA on CPAP, history of TIA who presented with dizziness upon waking and had difficulty walking.  CT scan reviewed and showed no acute stroke or abnormality.  MRI brain reviewed and showed punctate left parietal WM infarct.  Due to his symptoms, this was likely due to a small brainstem infarct that was not visualized on MRI with etiology likely small vessel disease.  CTA head and CTA neck showed no significant stenosis, occlusion or dissection.  2D echo was unremarkable.  LDL 45 and A1c 6.1.  Patient was previously on aspirin 81 mg and recommended to start Plavix.  Patient was previously on Lipitor 40 mg and this was increased to 80 mg.  Therapy recommended outpatient PT.  Patient discharged in stable condition.   Since discharge, patient has been doing well.  He continues to improve as far as his balance, diplopia and vertigo are concerned.  He continues PT and OT at our neuro rehabilitation center.  He does state that he was having blurry and double vision up to one year prior to his stroke for which he did see ophthalmologist for.  These vision difficulties come and go and increased when he is fatigued.  His vision did worsen slightly with the stroke but is currently back to his baseline vision prior to the stroke.  He is still able to see and read, but just feels as  though his vision is off.  He was referred to an neuro-ophthalmologist Dr. Hassell Done by PCP Dr. Brigitte Pulse and will be seeing him in August.  He does continue to take his Plavix without side effects of bleeding or bruising.  Continues to take Lipitor without side effects of myalgias.  He is compliant with his CPAP at night but has not been reevaluated by sleep specialist since moving to this area and 2006.  Blood pressure satisfactory at 126/73.  He does have complaints of back pain which he has experienced increased pain for approximately 6 months now after "twisting wrong".  His back pain is worsened when he wakes up in the morning and in the evening when he has increased fatigue.  He does have pain that radiates down his back and into the back of his thighs and this will be relieved when he sits but states if he sits for too long this pain worsens.  He was seeing a chiropractor that was helping but now he feels as though is not been getting better.  Denies bowel or bladder incontinence or issues and denies numbness or tingling.  No imaging studies have been done.  Patient also has concerns of occasional palpitations while he is sitting without recent activity.  Denies increased dizziness, lightheadedness or diaphoresis.  Denies chest pain or any other type of pain during this time.  Patient wondering if he is able to start driving at this time  as he has been driving since his stroke.  Denies new or worsening stroke/TIA symptoms.  Interval history: Since previous appointment, patient did have follow-up appointment with Dr. Rexene Alberts to evaluate for OSA.  Patient did undergo sleep study on 07/23/2018 and this did show obstructive sleep apnea and it was recommended to be treated with CPAP.  It was recommended to repeat sleep study in order to properly titrate the CPAP and ensure good mask fit.  Currently waiting on insurance approval.  Patient returns today for routine follow-up and is accompanied by his wife.  He continues to  have occasional dizziness with head turns or laying flat but only lasts a few seconds and resolves.  He has completed PT but he does continue home exercises along with working out at the gym.  He continues to take Plavix without side effects of bleeding or bruising.  Continues to take Lipitor without side effects of myalgias.  Blood pressure today satisfactory at 146/70 but does monitor this at home and typically SBP 1 20-1 30.  He denies further back pain that was discussed at previous visit stating that it has resolved and no longer is taking gabapentin.  He has returned back to all previous activities without complications.  Denies new or worsening stroke/TIA symptoms.     ROS:   14 system review of systems performed and negative with exception of dizziness  PMH:  Past Medical History:  Diagnosis Date  . Acute CVA (cerebrovascular accident) ([place]) 03/25/2018   "double vision and balance issues" (03/26/2018)  . ADD (attention deficit disorder)   . Arthritis    "thumbs and fingers" (03/26/2018)  . Diverticulosis   . GERD (gastroesophageal reflux disease)   . Heart murmur    "when I was young; not noted since age 65" (03/26/2018)  . History of kidney stones   . Hyperlipidemia   . Hypertension   . IFG (impaired fasting glucose)   . Metabolic syndrome   . OSA on CPAP   . Pneumonia    "a time or 2" (03/26/2018)  . PONV (postoperative nausea and vomiting)   . Shingles ~ 2002  . TIA (transient ischemic attack) 2013    PSH:  Past Surgical History:  Procedure Laterality Date  . CARPAL TUNNEL RELEASE Right   . CYSTOSCOPY W/ STONE MANIPULATION    . FINGER SURGERY Left    thumb surgery; "took bone out; put tendon in"  . TONSILLECTOMY AND ADENOIDECTOMY      Social History:  Social History   Socioeconomic History  . Marital status: Married    Spouse name: Not on file  . Number of children: Not on file  . Years of education: Not on file  . Highest education level: Not on file    Occupational History  . Not on file  Social Needs  . Financial resource strain: Not on file  . Food insecurity:    Worry: Not on file    Inability: Not on file  . Transportation needs:    Medical: Not on file    Non-medical: Not on file  Tobacco Use  . Smoking status: Never Smoker  . Smokeless tobacco: Never Used  Substance and Sexual Activity  . Alcohol use: No    Frequency: Never  . Drug use: Never  . Sexual activity: Not on file  Lifestyle  . Physical activity:    Days per week: Not on file    Minutes per session: Not on file  . Stress: Not on file  Relationships  . Social connections:    Talks on phone: Not on file    Gets together: Not on file    Attends religious service: Not on file    Active member of club or organization: Not on file    Attends meetings of clubs or organizations: Not on file    Relationship status: Not on file  . Intimate partner violence:    Fear of current or ex partner: Not on file    Emotionally abused: Not on file    Physically abused: Not on file    Forced sexual activity: Not on file  Other Topics Concern  . Not on file  Social History Narrative  . Not on file    Family History:  Family History  Problem Relation Age of Onset  . Prostate cancer Father     Medications:   Current Outpatient Medications on File Prior to Visit  Medication Sig Dispense Refill  . atorvastatin (LIPITOR) 40 MG tablet Take 40 mg by mouth daily.    . clopidogrel (PLAVIX) 75 MG tablet Take 1 tablet (75 mg total) by mouth daily. 30 tablet 0  . fluticasone (FLONASE) 50 MCG/ACT nasal spray Place 1 spray into both nostrils daily as needed for allergies.     . pantoprazole (PROTONIX) 40 MG tablet Take 40 mg by mouth daily.    Marland Kitchen telmisartan (MICARDIS) 80 MG tablet Take 80 mg by mouth daily.     No current facility-administered medications on file prior to visit.     Allergies:   Allergies  Allergen Reactions  . Morphine And Related Nausea And Vomiting      Physical Exam  There were no vitals filed for this visit. There is no height or weight on file to calculate BMI. No exam data present  General: well developed, pleasant elderly Caucasian male, well nourished, seated, in no evident distress Head: head normocephalic and atraumatic.   Neck: supple with no carotid or supraclavicular bruits Cardiovascular: regular rate and rhythm, no murmurs Musculoskeletal: no deformity Skin:  no rash/petichiae Vascular:  Normal pulses all extremities  Neurologic Exam Mental Status: Awake and fully alert. Oriented to place and time. Recent and remote memory intact. Attention span, concentration and fund of knowledge appropriate. Mood and affect appropriate.  Cranial Nerves: Fundoscopic exam reveals sharp disc margins. Pupils equal, briskly reactive to light. Extraocular movements full without nystagmus. Visual fields full to confrontation. Hearing intact. Facial sensation intact. Face, tongue, palate moves normally and symmetrically.  Motor: Normal bulk and tone. Normal strength in all tested extremity muscles. Sensory.: intact to touch , pinprick , position and vibratory sensation.  Coordination: Rapid alternating movements normal in all extremities. Finger-to-nose and heel-to-shin performed accurately bilaterally. Gait and Station: Arises from chair without difficulty. Stance is normal. Gait demonstrates normal stride length and balance . Able to heel, and toe without difficulty.  Mild difficulty with tandem gait. Reflexes: 1+ and symmetric. Toes downgoing.     Diagnostic Data (Labs, Imaging, Testing)  CT HEAD WO CONTRAST 03/25/18 IMPRESSION: 1. No CT evidence for acute intracranial abnormality. 2. Mild atrophy.  MR BRAIN WO CONTRAST 03/26/18 IMPRESSION: Punctate focus of acute ischemia within the left parietal white matter, without hemorrhage or mass effect. Otherwise normal MRI of the brain.  CT ANGIO HEAD W OR WO CONTRAST CT ANGIO NECK W  OR WO CONTRAST 03/26/18 MPRESSION: No intracranial or extracranial stenosis, occlusion, or dissection.  ECHOCARDIOGRAM COMPLETE 03/26/18 Impressions: - Normal LV size with mild LV hypertrophy. EF  55-60%. Normal RV   size and systolic function. Mild aortic insufficiency.    ASSESSMENT: KELECHI ORGERON is a 72 y.o. 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 Vascular risk factors include HTN, HLD, and OSA.  Patient returns today for routine stroke follow-up and overall is doing well with only occasional mild dizziness.    PLAN: -Continue clopidogrel 75 mg daily  and lipitor  for secondary stroke prevention -F/u with PCP regarding your HLD and HTN management -continue to monitor BP at home -Advised patient that he will be called to schedule repeat sleep study for CPAP titration -Advised to continue to stay active and maintain a healthy diet -Maintain strict control of hypertension with blood pressure goal below 130/90, diabetes with hemoglobin A1c goal below 6.5% and cholesterol with LDL cholesterol (bad cholesterol) goal below 70 mg/dL. I also advised the patient to eat a healthy diet with plenty of whole grains, cereals, fruits and vegetables, exercise regularly and maintain ideal body weight.  Patient will now be followed by Dr. Rexene Alberts due to diagnosis of OSA and needed CPAP with routine monitoring.  Patient will be called to schedule sleep study with CPAP titration and then will be continued to be followed in the office for CPAP compliance by Jinny Blossom, NP or Chrys Racer, NP.  No need to schedule follow-up appointment with me as he has been stable from a stroke standpoint.   Greater than 50% time during this 25 minute consultation visit was spent on counseling and coordination of care about HLD, HTN and OSA, discussion about risk benefit of anticoagulation and answering questions.     Venancio Poisson, AGNP-BC  [facility] [address] [place], [place] [address]  Phone [phone] Fax [phone]

## 2018-07-27 NOTE — Procedures (Signed)
PATIENT'S NAME:  Chris, [name] DOB:      12/27/1945      MR#:    [id number]     DATE OF RECORDING: 07/23/2018 REFERRING M.D.:  Venancio Poisson, NP Study Performed:   Baseline Polysomnogram HISTORY: 72 year old man with a history of stroke in April 2019, hypertension, hyperlipidemia, impaired fasting glucose, kidney stones, reflux disease, diverticulosis, arthritis, and overweight state, who was previously diagnosed with obstructive sleep apnea and placed on CPAP therapy. He presents for re-evaluation. The patient endorsed the Epworth Sleepiness Scale at 4 points. The patient's weight 200 pounds with a height of 71 (inches), resulting in a BMI of 28.1 kg/m2. The patient's neck circumference measured 17 inches.  CURRENT MEDICATIONS: Lipitor, Plavix, Flonase, Protonix, Micardis   PROCEDURE:  This is a multichannel digital polysomnogram utilizing the Somnostar 11.2 system.  Electrodes and sensors were applied and monitored per AASM Specifications.   EEG, EOG, Chin and Limb EMG, were sampled at 200 Hz.  ECG, Snore and Nasal Pressure, Thermal Airflow, Respiratory Effort, CPAP Flow and Pressure, Oximetry was sampled at 50 Hz. Digital video and audio were recorded.      BASELINE STUDY  Lights Out was at 21:16 and Lights On at 05:01.  Total recording time (TRT) was 465.5 minutes, with a total sleep time (TST) of  235.5 minutes.   The patient's sleep latency to persistent sleep was 107.5 minutes, which is markedly delayed. REM latency was 301.5 minutes, which is markedly delayed. The sleep efficiency was 50.6%.     SLEEP ARCHITECTURE: WASO (Wake after sleep onset) was 196.5 minutes with moderate to severe sleep fragmentation noted. There were 41 minutes in Stage N1, 169 minutes Stage N2, 0 minutes Stage N3 and 25.5 minutes in Stage REM.  The percentage of Stage N1 was 17.4%, Stage N2 was 71.8%, Stage N3 was 0% and Stage R (REM sleep) was 10.8%. The arousals were noted as: 94 were spontaneous, 95 were associated  with PLMs, 88 were associated with respiratory events.   RESPIRATORY ANALYSIS:  There were a total of 111 respiratory events:  81 obstructive apneas, 0 central apneas and 0 mixed apneas with a total of 81 apneas and an apnea index (AI) of 20.6 /hour. There were 30 hypopneas with a hypopnea index of 7.6 /hour. The patient also had 0 respiratory event related arousals (RERAs).      The total APNEA/HYPOPNEA INDEX (AHI) was 28.3/hour and the total RESPIRATORY DISTURBANCE INDEX was 0. 28.3 /hour.  6 events occurred in REM sleep and 50 events in NREM. The REM AHI was 14.1 /hour, versus a non-REM AHI of 30. The patient spent 46 minutes of total sleep time in the supine position and 190 minutes in non-supine. The supine AHI was 71.7 versus a non-supine AHI of 17.7.  OXYGEN SATURATION & C02:  The Wake baseline 02 saturation was 93%, with the lowest being 86%. Time spent below 89% saturation equaled 1 minutes. PERIODIC LIMB MOVEMENTS: The patient had a total of 156 Periodic Limb Movements.  The Periodic Limb Movement (PLM) index was 39.7 and the PLM Arousal index was 24.2/hour.  Audio and video analysis did not show any abnormal or unusual movements, behaviors, phonations or vocalizations. The patient took 1 bathroom break. Mild to moderate snoring was noted. The EKG was in keeping with normal sinus rhythm (NSR).  Post-study, the patient indicated that sleep was the same as usual.  IMPRESSION: 1. Obstructive Sleep Apnea (OSA) 2. Periodic Limb Movement Disorder (PLMD) 3. Dysfunctions associated with  sleep stages or arousal from sleep  RECOMMENDATIONS: 1. This study confirms moderate to severe obstructive sleep apnea, with a total AHI of 28.3/hour, REM AHI of 14.1/hour, supine AHI of 71.7/hour and O2 nadir of 86%. Treatment with positive airway pressure in the form of CPAP is recommended. This will require a full night titration study to optimize therapy. Other treatment options may include avoidance of  supine sleep position along with weight loss, upper airway or jaw surgery in selected patients or the use of an oral appliance in certain patients. ENT evaluation and/or consultation with a maxillofacial surgeon or dentist may be feasible in some instances. Please note that untreated obstructive sleep apnea may carry additional perioperative morbidity. Patients with significant obstructive sleep apnea should receive perioperative PAP therapy and the surgeons and particularly the anesthesiologist should be informed of the diagnosis and the severity of the sleep disordered breathing. 2. Mild PLMs (periodic limb movements of sleep) were noted during this study with no significant arousals; clinical correlation is recommended. Medication effect from the antidepressant medication should be considered.  3. This study shows sleep fragmentation and abnormal sleep stage percentages; these are nonspecific findings and per se do not signify an intrinsic sleep disorder or a cause for the patient's sleep-related symptoms. Causes include (but are not limited to) the first night effect of the sleep study, circadian rhythm disturbances, medication effect or an underlying mood disorder or medical problem.  4. The patient should be cautioned not to drive, work at heights, or operate dangerous or heavy equipment when tired or sleepy. Review and reiteration of good sleep hygiene measures should be pursued with any patient. 5. The patient will be seen in follow-up in the sleep clinic at [facility] for discussion of the test results, symptom and treatment compliance review, further management strategies, etc. The referring provider will be notified of the test results.  I certify that I have reviewed the entire raw data recording prior to the issuance of this report in accordance with the Standards of Accreditation of the American Academy of Sleep Medicine (AASM)   Star Age, MD, PhD Diplomat, American Board of Neurology and Sleep  Medicine (Neurology and Sleep Medicine)

## 2018-07-27 NOTE — Progress Notes (Signed)
Patient referred by Janett Billow, seen by me on 07/07/18, diagnostic PSG on 07/23/18 for re-eval of OSA.   Please call and notify the patient that the recent sleep study confirmed moderate to severe obstructive sleep apnea, with a total AHI of 28.3/hour, REM AHI of 14.1/hour, supine AHI of 71.7/hour and O2 nadir of 86%. I recommend we arrange for a sleep study for proper titration of CPAP and mask fitting and correct monitoring of the oxygen saturations. He did not qualify for a split study d/t disrupted sleep and reduced sleep efficiency, unfortunately. Please explain to patient. I have placed an order in the chart. Thanks.  Star Age, MD, PhD [facility])

## 2018-07-27 NOTE — Addendum Note (Signed)
Addended by: Star Age on: 07/27/2018 08:38 AM   Modules accepted: Orders

## 2018-07-28 ENCOUNTER — Ambulatory Visit (INDEPENDENT_AMBULATORY_CARE_PROVIDER_SITE_OTHER): Payer: Medicare HMO | Admitting: Neurology

## 2018-07-28 ENCOUNTER — Telehealth: Payer: Self-pay | Admitting: Neurology

## 2018-07-28 ENCOUNTER — Other Ambulatory Visit: Payer: Self-pay

## 2018-07-28 ENCOUNTER — Encounter: Payer: Self-pay | Admitting: Adult Health

## 2018-07-28 ENCOUNTER — Ambulatory Visit: Payer: Medicare HMO | Admitting: Adult Health

## 2018-07-28 VITALS — BP 146/70 | HR 67 | Resp 16 | Ht 71.0 in | Wt 202.0 lb

## 2018-07-28 DIAGNOSIS — G4720 Circadian rhythm sleep disorder, unspecified type: Secondary | ICD-10-CM

## 2018-07-28 DIAGNOSIS — G478 Other sleep disorders: Secondary | ICD-10-CM

## 2018-07-28 DIAGNOSIS — Z9989 Dependence on other enabling machines and devices: Secondary | ICD-10-CM

## 2018-07-28 DIAGNOSIS — I63512 Cerebral infarction due to unspecified occlusion or stenosis of left middle cerebral artery: Secondary | ICD-10-CM | POA: Diagnosis not present

## 2018-07-28 DIAGNOSIS — E785 Hyperlipidemia, unspecified: Secondary | ICD-10-CM | POA: Diagnosis not present

## 2018-07-28 DIAGNOSIS — I10 Essential (primary) hypertension: Secondary | ICD-10-CM | POA: Diagnosis not present

## 2018-07-28 DIAGNOSIS — Z8673 Personal history of transient ischemic attack (TIA), and cerebral infarction without residual deficits: Secondary | ICD-10-CM

## 2018-07-28 DIAGNOSIS — G4761 Periodic limb movement disorder: Secondary | ICD-10-CM

## 2018-07-28 DIAGNOSIS — G4733 Obstructive sleep apnea (adult) (pediatric): Secondary | ICD-10-CM

## 2018-07-28 DIAGNOSIS — E663 Overweight: Secondary | ICD-10-CM

## 2018-07-28 NOTE — Patient Instructions (Addendum)
Continue clopidogrel 75 mg daily  and lipitor  for secondary stroke prevention  Continue to follow up with PCP regarding cholesterol and blood pressure management   Follow up with Dr. Rexene Alberts for sleep apnea and CPAP monitoring  conitue to stay active and maintain a healthy diet  Continue to monitor blood pressure at home  Maintain strict control of hypertension with blood pressure goal below 130/90, diabetes with hemoglobin A1c goal below 6.5% and cholesterol with LDL cholesterol (bad cholesterol) goal below 70 mg/dL. I also advised the patient to eat a healthy diet with plenty of whole grains, cereals, fruits and vegetables, exercise regularly and maintain ideal body weight.  Followup in the future with Dr. Rexene Alberts or call earlier if needed       Thank you for coming to see [place] at [facility]. I hope we have been able to provide you high quality care today.  You may receive a patient satisfaction survey over the next few weeks. We would appreciate your feedback and comments so that we may continue to improve ourselves and the health of our patients.

## 2018-07-28 NOTE — Telephone Encounter (Signed)
-----   Message from Star Age, MD sent at 07/27/2018  8:38 AM EDT ----- Patient referred by Janett Billow, seen by me on 07/07/18, diagnostic PSG on 07/23/18 for re-eval of OSA.   Please call and notify the patient that the recent sleep study confirmed moderate to severe obstructive sleep apnea, with a total AHI of 28.3/hour, REM AHI of 14.1/hour, supine AHI of 71.7/hour and O2 nadir of 86%. I recommend we arrange for a sleep study for proper titration of CPAP and mask fitting and correct monitoring of the oxygen saturations. He did not qualify for a split study d/t disrupted sleep and reduced sleep efficiency, unfortunately. Please explain to patient. I have placed an order in the chart. Thanks.  Star Age, MD, PhD [facility])

## 2018-07-28 NOTE — Telephone Encounter (Signed)
I called pt. I advised pt that Dr. Rexene Alberts reviewed their sleep study results and found that has obstructive sleep apnea and recommends that pt be treated with a cpap. Dr. Rexene Alberts recommends that pt return for a repeat sleep study in order to properly titrate the cpap and ensure a good mask fit. Pt is agreeable to returning for a titration study. I advised pt that our sleep lab will file with pt's insurance and call pt to schedule the sleep study when we hear back from the pt's insurance regarding coverage of this sleep study. Pt verbalized understanding of results. Pt had no questions at this time but was encouraged to call back if questions arise.

## 2018-07-29 NOTE — Procedures (Signed)
Chris [name] is a 72 y.o. male patient. 1. History of stroke in prior 3 months   2. Non-restorative sleep   3. Overweight   4. OSA (obstructive sleep apnea)   5. PLMD (periodic limb movement disorder)   6. Dysfunctions associated with sleep stages or arousal from sleep    Past Medical History:  Diagnosis Date  . Acute CVA (cerebrovascular accident) ([place]) 03/25/2018   "double vision and balance issues" (03/26/2018)  . ADD (attention deficit disorder)   . Arthritis    "thumbs and fingers" (03/26/2018)  . Diverticulosis   . GERD (gastroesophageal reflux disease)   . Heart murmur    "when I was young; not noted since age 36" (03/26/2018)  . History of kidney stones   . Hyperlipidemia   . Hypertension   . IFG (impaired fasting glucose)   . Metabolic syndrome   . OSA on CPAP   . Pneumonia    "a time or 2" (03/26/2018)  . PONV (postoperative nausea and vomiting)   . Shingles ~ 2002  . TIA (transient ischemic attack) 2013   There were no vitals taken for this visit.  Procedures  Fredirick Maudlin 07/29/2018

## 2018-07-30 ENCOUNTER — Encounter: Payer: Self-pay | Admitting: Adult Health

## 2018-07-31 NOTE — Procedures (Signed)
PATIENT'S NAME:  Chris [name], Chris [name] DOB:      [birth year]      MR#:    [id number]     DATE OF RECORDING: 07/28/2018 REFERRING M.D.:  Venancio Poisson, NP Study Performed:   CPAP  Titration History: 72 year old man with a history of stroke in April 2019, hypertension, hyperlipidemia, impaired fasting glucose, kidney stones, reflux disease, diverticulosis, arthritis, and overweight state, who returns for a CPAP Titration study. His baseline PSG on 07/23/18 showed an AHI of 28.3, REM AHI of 14.1, and a Supine AHI of 71.7, with an oxygen saturation nadir of 86%. The patient's weight 200 pounds with a height of 71 (inches), resulting in a BMI of 28.1 kg/m2. The patient's neck circumference measured 17 inches.  CURRENT MEDICATIONS: Lipitor, Plavix, Flonase, Protonix, Micardis  PROCEDURE:  This is a multichannel digital polysomnogram utilizing the SomnoStar 11.2 system.  Electrodes and sensors were applied and monitored per AASM Specifications.   EEG, EOG, Chin and Limb EMG, were sampled at 200 Hz.  ECG, Snore and Nasal Pressure, Thermal Airflow, Respiratory Effort, CPAP Flow and Pressure, Oximetry was sampled at 50 Hz. Digital video and audio were recorded.      The patient was fitted with a medium Eson nasal mask. CPAP was initiated at 5 cmH20 with heated humidity per AASM standards and pressure was advanced to 9 cmH20 because of hypopneas, apneas and desaturations.  At a PAP pressure of 9 cmH20, there was a reduction of the AHI to 0.4 with supine REM sleep achieved and O2 nadir of 91%.   Lights Out was at 20:54 and Lights On at 04:58. Total recording time (TRT) was 484.5 minutes, with a total sleep time (TST) of 311 minutes. The patient's sleep latency was 63.5 minutes, which is delayed. REM latency was 330.5 minutes, which is markedly delayed. The sleep efficiency was 64.2%, which is reduced.    SLEEP ARCHITECTURE: WASO (Wake after sleep onset) was 149 minutes with moderate to severe sleep fragmentation noted.  There were 42 minutes in Stage N1, 199.5 minutes Stage N2, 39.5 minutes Stage N3 and 30 minutes in Stage REM.  The percentage of Stage N1 was 13.5%, which is increased, Stage N2 was 64.1%, which is increased, Stage N3 was 12.7% and Stage R (REM sleep) was 9.6%, which is reduced. The arousals were noted as: 90 were spontaneous, 144 were associated with PLMs, 20 were associated with respiratory events.  RESPIRATORY ANALYSIS:  There was a total of 26 respiratory events: 1 obstructive apneas, 8 central apneas and 0 mixed apneas with a total of 9 apneas and an apnea index (AI) of 1.7 /hour. There were 17 hypopneas with a hypopnea index of 3.3/hour. The patient also had 0 respiratory event related arousals (RERAs).      The total APNEA/HYPOPNEA INDEX (AHI) was 5/hour and the total RESPIRATORY DISTURBANCE INDEX was 5. 0./hour  0 events occurred in REM sleep and 26 events in NREM. The REM AHI was 0 /hour versus a non-REM AHI of 5.6 0./hour.  The patient spent 74 minutes of total sleep time in the supine position and 237 minutes in non-supine. The supine AHI was 15.4, versus a non-supine AHI of 1.8.  OXYGEN SATURATION & C02:  The baseline 02 saturation was 96%, with the lowest being 73%. Time spent below 89% saturation equaled 0 minutes.  PERIODIC LIMB MOVEMENTS:  The patient had a total of 292 Periodic Limb Movements. The Periodic Limb Movement (PLM) index was 56.3 and the PLM Arousal index was  27.8 /hour.  Audio and video analysis did not show any abnormal or unusual movements, behaviors, phonations or vocalizations. The patient took 1 bathroom break. The EKG was in keeping with normal sinus rhythm (NSR).  Post-study, the patient indicated that sleep was the same as usual.   IMPRESSION: 1. Obstructive Sleep Apnea (OSA) 2. Periodic Limb Movement Disorder (PLMD) 3. Dysfunctions associated with sleep stages or arousal from sleep   RECOMMENDATIONS: 1. This study demonstrates resolution of the patient's  obstructive sleep apnea with CPAP therapy. I will, therefore, start the patient on home CPAP treatment with a new CPAP machine at a pressure of 9 cm via medium nasal mask with heated humidity. The patient should be reminded to be fully compliant with PAP therapy to improve sleep related symptoms and decrease long term cardiovascular risks. The patient should be reminded, that it may take up to 3 months to get fully used to using PAP with all planned sleep. The earlier full compliance is achieved, the better long term compliance tends to be. Please note that untreated obstructive sleep apnea may carry additional perioperative morbidity. Patients with significant obstructive sleep apnea should receive perioperative PAP therapy and the surgeons and particularly the anesthesiologist should be informed of the diagnosis and the severity of the sleep disordered breathing. 2. Severe PLMs (periodic limb movements of sleep) were noted during this study with moderate arousals; clinical correlation is recommended.  3. This study shows significant sleep fragmentation and abnormal sleep stage percentages; these are nonspecific findings and per se do not signify an intrinsic sleep disorder or a cause for the patient's sleep-related symptoms. Causes include (but are not limited to) the first night effect of the sleep study, circadian rhythm disturbances, medication effect or an underlying mood disorder or medical problem.  4. The patient should be cautioned not to drive, work at heights, or operate dangerous or heavy equipment when tired or sleepy. Review and reiteration of good sleep hygiene measures should be pursued with any patient. 5. The patient will be seen in follow-up in the sleep clinic at [facility] for discussion of the test results, symptom and treatment compliance review, further management strategies, etc. The referring provider will be notified of the test results.   I certify that I have reviewed the entire raw data  recording prior to the issuance of this report in accordance with the Standards of Accreditation of the American Academy of Sleep Medicine (AASM)     Star Age, MD, PhD Diplomat, American Board of Neurology and Sleep Medicine (Neurology and Sleep Medicine)

## 2018-07-31 NOTE — Progress Notes (Signed)
Patient referred by Janett Billow, seen by me on 07/07/18, diagnostic PSG on 07/23/18 for re-eval of OSA, Patient had a CPAP titration study on 07/28/18.  Please call and inform patient that I have entered an order for treatment with positive airway pressure (PAP) treatment for obstructive sleep apnea (OSA). He did well during the latest sleep study with CPAP. We will, therefore, arrange for a new CPAP machine for home use through his old DME (durable medical equipment) company of a new one of his choice; and I will see the patient back in follow-up in about 10 weeks. Please also explain to the patient that I will be looking out for compliance data, which can be downloaded from the machine (stored on an SD card, that is inserted in the machine) or via remote access through a modem, that is built into the machine. At the time of the followup appointment we will discuss sleep study results and how it is going with PAP treatment at home. Please advise patient to bring His machine at the time of the first FU visit, even though this is cumbersome. Bringing the machine for every visit after that will likely not be needed, but often helps for the first visit to troubleshoot if needed. Please re-enforce the importance of compliance with treatment and the need for [place] to monitor compliance data - often an insurance requirement and actually good feedback for the patient as far as how they are doing.  Also remind patient, that any interim PAP machine or mask issues should be first addressed with the DME company, as they can often help better with technical and mask fit issues. Please ask if patient has a preference regarding DME company.  Please also make sure, the patient has a follow-up appointment with me in about 10 weeks from the setup date, thanks. May see one of our nurse practitioners if needed for proper timing of the FU appointment.  Please fax or rout report to the referring provider. Thanks,   Star Age, MD,  PhD [facility])  .

## 2018-07-31 NOTE — Addendum Note (Signed)
Addended by: Star Age on: 07/31/2018 11:56 AM   Modules accepted: Orders

## 2018-08-03 ENCOUNTER — Telehealth: Payer: Self-pay

## 2018-08-03 NOTE — Telephone Encounter (Signed)
I called pt. I advised pt that Dr. Rexene Alberts reviewed their sleep study results and found that pt pt did well with the cpap during his latest sleep study. Dr. Rexene Alberts recommends that pt start a new cpap at home. I reviewed PAP compliance expectations with the pt. Pt is agreeable to starting a CPAP. I advised pt that an order will be sent to a DME, [employer], and [employer] will call the pt within about one week after they file with the pt's insurance. [employer] will show the pt how to use the machine, fit for masks, and troubleshoot the CPAP if needed. A follow up appt was made for insurance purposes with Dr. Rexene Alberts on 10/26/18 at 11:30am. Pt verbalized understanding to arrive 15 minutes early and bring their CPAP. A letter with all of this information in it will be sent to the pt's mychart account as a reminder. I verified with the pt that the address we have on file is correct. Pt verbalized understanding of results. Pt had no questions at this time but was encouraged to call back if questions arise.

## 2018-08-03 NOTE — Telephone Encounter (Signed)
-----   Message from Star Age, MD sent at 07/31/2018 11:56 AM EDT ----- Patient referred by Janett Billow, seen by me on 07/07/18, diagnostic PSG on 07/23/18 for re-eval of OSA, Patient had a CPAP titration study on 07/28/18.  Please call and inform patient that I have entered an order for treatment with positive airway pressure (PAP) treatment for obstructive sleep apnea (OSA). He did well during the latest sleep study with CPAP. We will, therefore, arrange for a new CPAP machine for home use through his old DME (durable medical equipment) company of a new one of his choice; and I will see the patient back in follow-up in about 10 weeks. Please also explain to the patient that I will be looking out for compliance data, which can be downloaded from the machine (stored on an SD card, that is inserted in the machine) or via remote access through a modem, that is built into the machine. At the time of the followup appointment we will discuss sleep study results and how it is going with PAP treatment at home. Please advise patient to bring His machine at the time of the first FU visit, even though this is cumbersome. Bringing the machine for every visit after that will likely not be needed, but often helps for the first visit to troubleshoot if needed. Please re-enforce the importance of compliance with treatment and the need for [place] to monitor compliance data - often an insurance requirement and actually good feedback for the patient as far as how they are doing.  Also remind patient, that any interim PAP machine or mask issues should be first addressed with the DME company, as they can often help better with technical and mask fit issues. Please ask if patient has a preference regarding DME company.  Please also make sure, the patient has a follow-up appointment with me in about 10 weeks from the setup date, thanks. May see one of our nurse practitioners if needed for proper timing of the FU appointment.  Please fax or rout  report to the referring provider. Thanks,   Star Age, MD, PhD [facility])  .

## 2018-08-05 DIAGNOSIS — D225 Melanocytic nevi of trunk: Secondary | ICD-10-CM | POA: Diagnosis not present

## 2018-08-05 DIAGNOSIS — Z1283 Encounter for screening for malignant neoplasm of skin: Secondary | ICD-10-CM | POA: Diagnosis not present

## 2018-08-06 NOTE — Progress Notes (Signed)
I agree with the above plan 

## 2018-08-12 DIAGNOSIS — H3554 Dystrophies primarily involving the retinal pigment epithelium: Secondary | ICD-10-CM | POA: Diagnosis not present

## 2018-08-12 DIAGNOSIS — H02834 Dermatochalasis of left upper eyelid: Secondary | ICD-10-CM | POA: Diagnosis not present

## 2018-08-12 DIAGNOSIS — I69398 Other sequelae of cerebral infarction: Secondary | ICD-10-CM | POA: Diagnosis not present

## 2018-08-12 DIAGNOSIS — H539 Unspecified visual disturbance: Secondary | ICD-10-CM | POA: Diagnosis not present

## 2018-08-12 DIAGNOSIS — R638 Other symptoms and signs concerning food and fluid intake: Secondary | ICD-10-CM | POA: Diagnosis not present

## 2018-08-12 DIAGNOSIS — H2513 Age-related nuclear cataract, bilateral: Secondary | ICD-10-CM | POA: Diagnosis not present

## 2018-08-12 DIAGNOSIS — H5052 Exophoria: Secondary | ICD-10-CM | POA: Diagnosis not present

## 2018-08-12 DIAGNOSIS — H47393 Other disorders of optic disc, bilateral: Secondary | ICD-10-CM | POA: Diagnosis not present

## 2018-08-12 DIAGNOSIS — H532 Diplopia: Secondary | ICD-10-CM | POA: Diagnosis not present

## 2018-08-12 DIAGNOSIS — H02831 Dermatochalasis of right upper eyelid: Secondary | ICD-10-CM | POA: Diagnosis not present

## 2018-08-12 DIAGNOSIS — H5213 Myopia, bilateral: Secondary | ICD-10-CM | POA: Diagnosis not present

## 2018-08-27 DIAGNOSIS — G4733 Obstructive sleep apnea (adult) (pediatric): Secondary | ICD-10-CM | POA: Diagnosis not present

## 2018-08-28 ENCOUNTER — Encounter: Payer: Self-pay | Admitting: Neurology

## 2018-09-16 DIAGNOSIS — H5053 Vertical heterophoria: Secondary | ICD-10-CM | POA: Diagnosis not present

## 2018-09-16 DIAGNOSIS — H43392 Other vitreous opacities, left eye: Secondary | ICD-10-CM | POA: Diagnosis not present

## 2018-09-16 DIAGNOSIS — H5052 Exophoria: Secondary | ICD-10-CM | POA: Diagnosis not present

## 2018-09-16 DIAGNOSIS — Z8673 Personal history of transient ischemic attack (TIA), and cerebral infarction without residual deficits: Secondary | ICD-10-CM | POA: Diagnosis not present

## 2018-09-16 DIAGNOSIS — H02839 Dermatochalasis of unspecified eye, unspecified eyelid: Secondary | ICD-10-CM | POA: Diagnosis not present

## 2018-09-16 DIAGNOSIS — I69998 Other sequelae following unspecified cerebrovascular disease: Secondary | ICD-10-CM | POA: Diagnosis not present

## 2018-09-16 DIAGNOSIS — H532 Diplopia: Secondary | ICD-10-CM | POA: Diagnosis not present

## 2018-09-16 DIAGNOSIS — Z885 Allergy status to narcotic agent status: Secondary | ICD-10-CM | POA: Diagnosis not present

## 2018-09-19 DIAGNOSIS — Z23 Encounter for immunization: Secondary | ICD-10-CM | POA: Diagnosis not present

## 2018-09-26 DIAGNOSIS — G4733 Obstructive sleep apnea (adult) (pediatric): Secondary | ICD-10-CM | POA: Diagnosis not present

## 2018-10-15 DIAGNOSIS — H2513 Age-related nuclear cataract, bilateral: Secondary | ICD-10-CM | POA: Diagnosis not present

## 2018-10-15 DIAGNOSIS — H43811 Vitreous degeneration, right eye: Secondary | ICD-10-CM | POA: Diagnosis not present

## 2018-10-15 DIAGNOSIS — H524 Presbyopia: Secondary | ICD-10-CM | POA: Diagnosis not present

## 2018-10-15 DIAGNOSIS — H269 Unspecified cataract: Secondary | ICD-10-CM | POA: Diagnosis not present

## 2018-10-15 DIAGNOSIS — H52209 Unspecified astigmatism, unspecified eye: Secondary | ICD-10-CM | POA: Diagnosis not present

## 2018-10-15 DIAGNOSIS — H5213 Myopia, bilateral: Secondary | ICD-10-CM | POA: Diagnosis not present

## 2018-10-15 DIAGNOSIS — H5210 Myopia, unspecified eye: Secondary | ICD-10-CM | POA: Diagnosis not present

## 2018-10-20 ENCOUNTER — Emergency Department (HOSPITAL_COMMUNITY): Payer: Medicare HMO

## 2018-10-20 ENCOUNTER — Other Ambulatory Visit: Payer: Self-pay

## 2018-10-20 ENCOUNTER — Emergency Department (HOSPITAL_COMMUNITY)
Admission: EM | Admit: 2018-10-20 | Discharge: 2018-10-20 | Disposition: A | Discharge status: Home / Self Care | Payer: Medicare HMO | Attending: Emergency Medicine | Admitting: Emergency Medicine

## 2018-10-20 DIAGNOSIS — R260 Ataxic gait: Secondary | ICD-10-CM | POA: Insufficient documentation

## 2018-10-20 DIAGNOSIS — Z79899 Other long term (current) drug therapy: Secondary | ICD-10-CM | POA: Insufficient documentation

## 2018-10-20 DIAGNOSIS — R42 Dizziness and giddiness: Secondary | ICD-10-CM | POA: Diagnosis not present

## 2018-10-20 DIAGNOSIS — I10 Essential (primary) hypertension: Secondary | ICD-10-CM | POA: Diagnosis not present

## 2018-10-20 DIAGNOSIS — E7849 Other hyperlipidemia: Secondary | ICD-10-CM | POA: Diagnosis not present

## 2018-10-20 DIAGNOSIS — Z7901 Long term (current) use of anticoagulants: Secondary | ICD-10-CM | POA: Diagnosis not present

## 2018-10-20 DIAGNOSIS — Z6828 Body mass index (BMI) 28.0-28.9, adult: Secondary | ICD-10-CM | POA: Diagnosis not present

## 2018-10-20 DIAGNOSIS — Z8673 Personal history of transient ischemic attack (TIA), and cerebral infarction without residual deficits: Secondary | ICD-10-CM | POA: Diagnosis not present

## 2018-10-20 DIAGNOSIS — R55 Syncope and collapse: Secondary | ICD-10-CM | POA: Diagnosis not present

## 2018-10-20 LAB — DIFFERENTIAL
Abs Immature Granulocytes: 0.02 10*3/uL (ref 0.00–0.07)
BASOS ABS: 0.1 10*3/uL (ref 0.0–0.1)
BASOS PCT: 1 %
EOS ABS: 0.2 10*3/uL (ref 0.0–0.5)
EOS PCT: 2 %
Immature Granulocytes: 0 %
Lymphocytes Relative: 19 %
Lymphs Abs: 1.9 10*3/uL (ref 0.7–4.0)
MONO ABS: 0.7 10*3/uL (ref 0.1–1.0)
Monocytes Relative: 8 %
NEUTROS ABS: 6.8 10*3/uL (ref 1.7–7.7)
NEUTROS PCT: 70 %

## 2018-10-20 LAB — I-STAT CHEM 8, ED
BUN: 11 mg/dL (ref 8–23)
Calcium, Ion: 1.15 mmol/L (ref 1.15–1.40)
Chloride: 105 mmol/L (ref 98–111)
Creatinine, Ser: 1.1 mg/dL (ref 0.61–1.24)
GLUCOSE: 98 mg/dL (ref 70–99)
HCT: 44 % (ref 39.0–52.0)
HEMOGLOBIN: 15 g/dL (ref 13.0–17.0)
POTASSIUM: 4.2 mmol/L (ref 3.5–5.1)
Sodium: 141 mmol/L (ref 135–145)
TCO2: 27 mmol/L (ref 22–32)

## 2018-10-20 LAB — COMPREHENSIVE METABOLIC PANEL
ALT: 25 U/L (ref 0–44)
ANION GAP: 7 (ref 5–15)
AST: 20 U/L (ref 15–41)
Albumin: 4.4 g/dL (ref 3.5–5.0)
Alkaline Phosphatase: 79 U/L (ref 38–126)
BUN: 9 mg/dL (ref 8–23)
CO2: 25 mmol/L (ref 22–32)
Calcium: 9.3 mg/dL (ref 8.9–10.3)
Chloride: 107 mmol/L (ref 98–111)
Creatinine, Ser: 1.09 mg/dL (ref 0.61–1.24)
GFR calc non Af Amer: >60 mL/min (ref >60)
Glucose, Bld: 101 mg/dL — ABNORMAL HIGH (ref 70–99)
POTASSIUM: 4.4 mmol/L (ref 3.5–5.1)
SODIUM: 139 mmol/L (ref 135–145)
Total Bilirubin: 1 mg/dL (ref 0.3–1.2)
Total Protein: 7 g/dL (ref 6.5–8.1)

## 2018-10-20 LAB — CBC
HCT: 46.5 % (ref 39.0–52.0)
HEMOGLOBIN: 14.9 g/dL (ref 13.0–17.0)
MCH: 29.3 pg (ref 26.0–34.0)
MCHC: 32 g/dL (ref 30.0–36.0)
MCV: 91.5 fL (ref 80.0–100.0)
Platelets: 273 10*3/uL (ref 150–400)
RBC: 5.08 MIL/uL (ref 4.22–5.81)
RDW: 13.4 % (ref 11.5–15.5)
WBC: 9.7 10*3/uL (ref 4.0–10.5)
nRBC: 0 % (ref 0.0–0.2)

## 2018-10-20 LAB — URINALYSIS, ROUTINE W REFLEX MICROSCOPIC
Bilirubin Urine: NEGATIVE
Glucose, UA: NEGATIVE mg/dL
Hgb urine dipstick: NEGATIVE
KETONES UR: NEGATIVE mg/dL
Leukocytes, UA: NEGATIVE
Nitrite: NEGATIVE
PROTEIN: NEGATIVE mg/dL
Specific Gravity, Urine: 1.006 (ref 1.005–1.030)
pH: 6 (ref 5.0–8.0)

## 2018-10-20 LAB — I-STAT TROPONIN, ED: Troponin i, poc: 0 ng/mL (ref 0.00–0.08)

## 2018-10-20 NOTE — ED Notes (Signed)
Patient transported to MRI 

## 2018-10-20 NOTE — ED Triage Notes (Addendum)
Pt having symptoms of vertigo and ataxia. Worsening. Woke up at 0700 very dizzy with room spinning fast. Dizziness worse when moves head. Went to doc but could not get him in to outpatient MRI so wanted him to come here. HE can walk but he stumbles. He is having some difficulty recalling words. No new visual symptoms and no numbness or weakness. LSN 2230 10/19/18. No neuro deficits at this time.

## 2018-10-20 NOTE — ED Notes (Signed)
I informed pt of UA need.  

## 2018-10-20 NOTE — ED Provider Notes (Signed)
Patient placed in Quick Look pathway, seen and evaluated   Chief Complaint: Vertigo  HPI:   Had a CVA April, 2019. Felt like he was getting back to normal the past 6-8wks. Woke up this morning with sensation of room spinning. Last known normal 10:30PM last night. Room spinning has gotten better throughout the day, but still there. Worse when he moves his head. Stumbles when he walks. States that he felt he had trouble getting words out.   ROS: No numbness, weakness or visual disturbance  Physical Exam:   Gen: No distress  Neuro: Awake and Alert  Skin: Warm    Focused Exam: VAN negative. EOM normal without nystagmus. Speech fluent without aphasia or dysarthria. Strength 5/5 in bilateral upper and lower extremities including strong and equal grip strength and knee flexion/extension. Finger to nose slow, but without ataxia.    Initiation of care has begun. The patient has been counseled on the process, plan, and necessity for staying for the completion/evaluation, and the remainder of the medical screening examination    Bernarda Caffey 10/20/18 1404    Noemi Chapel, MD 10/20/18 1843

## 2018-10-20 NOTE — ED Notes (Signed)
Patient verbalizes understanding of discharge instructions. Opportunity for questioning and answers were provided. Armband removed by staff, pt discharged from ED.  

## 2018-10-20 NOTE — ED Provider Notes (Signed)
[facility] EMERGENCY DEPARTMENT Provider Note   CSN: [id number] Arrival date & time: 10/20/18  1348     History   Chief Complaint Chief Complaint  Patient presents with  . Cerebrovascular Accident    HPI Chris [name] is a 72 y.o. male.  HPI  Vision is a 72 year old male, he has a history of prior stroke, this occurred in April, he had a "punctate focus of acute ischemia in the left parietal white matter without any hemorrhage or mass-effect".  As far as it is clear there was no signs of cerebellar hemorrhage however he states at that time he was having dizziness, difficulty with vision but was able to rehabilitate very well to the point where he is driving walking and only using a cane for going up and down stairs.  He reports that this morning he woke up with acute onset of vertigo, the seem to be positional, worse with changing his head position right and left, better when he held perfectly still, lasted for several hours, he went to his doctor's office, Dr. Lang Snow, MRI was unable to be obtained as an outpatient and he was sent to the hospital for neuro imaging to rule out central stroke.  The patient is on Plavix, he has no history of atrial fibrillation, it was unclear why he had his ischemic stroke at his prior visit.  The patient states that his vertigo has completely gone away at this point and he denies any visual changes or numbness or weakness of the arms or the legs.  Past Medical History:  Diagnosis Date  . Acute CVA (cerebrovascular accident) ([place]) 03/25/2018   "double vision and balance issues" (03/26/2018)  . ADD (attention deficit disorder)   . Arthritis    "thumbs and fingers" (03/26/2018)  . Diverticulosis   . GERD (gastroesophageal reflux disease)   . Heart murmur    "when I was young; not noted since age 83" (03/26/2018)  . History of kidney stones   . Hyperlipidemia   . Hypertension   . IFG (impaired fasting glucose)   . Metabolic syndrome    . OSA on CPAP   . Pneumonia    "a time or 2" (03/26/2018)  . PONV (postoperative nausea and vomiting)   . Shingles ~ 2002  . TIA (transient ischemic attack) 2013    Patient Active Problem List   Diagnosis Date Noted  . Acute CVA (cerebrovascular accident) ([place]) 03/26/2018  . Obesity (BMI 30.0-34.9) 03/26/2018  . Hyperlipidemia 03/26/2018  . OSA on CPAP 03/26/2018  . Hypertension 03/26/2018  . GERD (gastroesophageal reflux disease) 03/26/2018  . Leukocytosis 03/26/2018  . Acute hyperglycemia 03/26/2018    Past Surgical History:  Procedure Laterality Date  . CARPAL TUNNEL RELEASE Right   . CYSTOSCOPY W/ STONE MANIPULATION    . FINGER SURGERY Left    thumb surgery; "took bone out; put tendon in"  . TONSILLECTOMY AND ADENOIDECTOMY          Home Medications    Prior to Admission medications   Medication Sig Start Date End Date Taking? Authorizing Provider  atorvastatin (LIPITOR) 40 MG tablet Take 40 mg by mouth daily at 6 PM.    Yes [provider]  clopidogrel (PLAVIX) 75 MG tablet Take 1 tablet (75 mg total) by mouth daily. Patient taking differently: Take 75 mg by mouth at bedtime.  03/28/18  Yes Patrecia Pour, MD  fluticasone (FLONASE) 50 MCG/ACT nasal spray Place 1 spray into both nostrils  daily as needed for allergies.    Yes [provider]  ibuprofen (ADVIL,MOTRIN) 200 MG tablet Take 400 mg by mouth every 6 (six) hours as needed.   Yes [provider]  pantoprazole (PROTONIX) 40 MG tablet Take 40 mg by mouth at bedtime.    Yes [provider]  telmisartan (MICARDIS) 80 MG tablet Take 80 mg by mouth at bedtime.    Yes [provider]    Family History Family History  Problem Relation Age of Onset  . Prostate cancer Father     Social History Social History   Tobacco Use  . Smoking status: Never Smoker  . Smokeless tobacco: Never Used  Substance Use Topics  . Alcohol use: No    Frequency: Never  . Drug use: Never       Allergies   Morphine and related   Review of Systems Review of Systems  All other systems reviewed and are negative.    Physical Exam Updated Vital Signs BP (!) 159/75   Pulse (!) 57   Temp 97.6 F (36.4 C) (Oral)   Resp 13   SpO2 99%   Physical Exam  Constitutional: He appears well-developed and well-nourished. No distress.  HENT:  Head: Normocephalic and atraumatic.  Mouth/Throat: Oropharynx is clear and moist. No oropharyngeal exudate.  Eyes: Pupils are equal, round, and reactive to light. Conjunctivae and EOM are normal. Right eye exhibits no discharge. Left eye exhibits no discharge. No scleral icterus.  Neck: Normal range of motion. Neck supple. No JVD present. No thyromegaly present.  Cardiovascular: Normal rate, regular rhythm, normal heart sounds and intact distal pulses. Exam reveals no gallop and no friction rub.  No murmur heard. Pulmonary/Chest: Effort normal and breath sounds normal. No respiratory distress. He has no wheezes. He has no rales.  Abdominal: Soft. Bowel sounds are normal. He exhibits no distension and no mass. There is no tenderness.  Musculoskeletal: Normal range of motion. He exhibits no edema or tenderness.  Lymphadenopathy:    He has no cervical adenopathy.  Neurological: He is alert. Coordination normal.  Neurologic exam:  Speech clear, pupils equal round reactive to light, extraocular movements intact  Normal peripheral visual fields Cranial nerves III through XII normal including no facial droop Follows commands, moves all extremities x4, normal strength to bilateral upper and lower extremities at all major muscle groups including grip Sensation normal to light touch and pinprick Coordination intact, no limb ataxia, finger-nose-finger normal, heel shin normal bilaterally Rapid alternating movements normal No pronator drift Gait normal Can heal and toe walk without weakness.   Skin: Skin is warm and dry. No rash noted. No  erythema.  Psychiatric: He has a normal mood and affect. His behavior is normal.  Nursing note and vitals reviewed.    ED Treatments / Results  Labs (all labs ordered are listed, but only abnormal results are displayed) Labs Reviewed  COMPREHENSIVE METABOLIC PANEL - Abnormal; Notable for the following components:      Result Value   Glucose, Bld 101 (*)    All other components within normal limits  CBC  DIFFERENTIAL  URINALYSIS, ROUTINE W REFLEX MICROSCOPIC  I-STAT CHEM 8, ED  I-STAT TROPONIN, ED    EKG EKG Interpretation  Date/Time:  Tuesday October 20 2018 13:49:05 EDT Ventricular Rate:  60 PR Interval:  162 QRS Duration: 100 QT Interval:  424 QTC Calculation: 424 R Axis:   -19 Text Interpretation:  Normal sinus rhythm Incomplete right bundle branch block Borderline  ECG Since last tracing rate slower Confirmed by Noemi Chapel [phone]) on 10/20/2018 5:59:55 PM   Radiology Ct Head Wo Contrast  Result Date: 10/20/2018 CLINICAL DATA:  Vertigo, ataxia EXAM: CT HEAD WITHOUT CONTRAST TECHNIQUE: Contiguous axial images were obtained from the base of the skull through the vertex without intravenous contrast. COMPARISON:  03/26/2018 FINDINGS: Brain: No evidence of acute infarction, hemorrhage, extra-axial collection, ventriculomegaly, or mass effect. Generalized cerebral atrophy. Periventricular white matter low attenuation likely secondary to microangiopathy. Vascular: Cerebrovascular atherosclerotic calcifications are noted. Skull: Negative for fracture or focal lesion. Sinuses/Orbits: Visualized portions of the orbits are unremarkable. Visualized portions of the paranasal sinuses and mastoid air cells are unremarkable. Other: None. IMPRESSION: No acute intracranial pathology. Electronically Signed   By: Kathreen Devoid   On: 10/20/2018 15:02   Mr Brain Wo Contrast  Result Date: 10/20/2018 CLINICAL DATA:  Worsening vertigo and ataxia since this morning. History of stroke,  hypertension, hyperlipidemia. EXAM: MRI HEAD WITHOUT CONTRAST TECHNIQUE: Multiplanar, multiecho pulse sequences of the brain and surrounding structures were obtained without intravenous contrast. COMPARISON:  CT HEAD October 20, 2018 and MRI head March 26, 2018. FINDINGS: INTRACRANIAL CONTENTS: No reduced diffusion to suggest acute ischemia. No susceptibility artifact to suggest hemorrhage. The ventricles and sulci are normal for patient's age. No suspicious parenchymal signal, masses, mass effect. No abnormal extra-axial fluid collections. No extra-axial masses. VASCULAR: Normal major intracranial vascular flow voids present at skull base. SKULL AND UPPER CERVICAL SPINE: No abnormal sellar expansion. No suspicious calvarial bone marrow signal. Craniocervical junction maintained. SINUSES/ORBITS: Mild paranasal sinus mucosal thickening, status post FESS. Mastoid air cells are well aerated.The included ocular globes and orbital contents are non-suspicious. OTHER: None. IMPRESSION: Negative noncontrast MRI head for age. Electronically Signed   By: Elon Alas M.D.   On: 10/20/2018 21:26    Procedures Procedures (including critical care time)  Medications Ordered in ED Medications - No data to display   Initial Impression / Assessment and Plan / ED Course  I have reviewed the triage vital signs and the nursing notes.  Pertinent labs & imaging results that were available during my care of the patient were reviewed by me and considered in my medical decision making (see chart for details).  Clinical Course as of Oct 20 2133  Tue Oct 20, 2018  2129 Likely the MRI is negative, the labs have been unremarkable, the patient is stable for discharge without recurrent symptoms.   [BM]    Clinical Course User Index [BM] Noemi Chapel, MD    At this time the patient is doing well, he has no active symptoms, he has a negative CT scan and labs, I have discussed his care with the patient and it seems  reasonable to order an MRI.  [facility] to discuss  Discussed with Dr. Leonel Ramsay who agrees with MRI and home if negative  Final Clinical Impressions(s) / ED Diagnoses   Final diagnoses:  Vertigo    ED Discharge Orders    None       Noemi Chapel, MD 10/20/18 2134

## 2018-10-20 NOTE — Discharge Instructions (Signed)
Your testing has been unremarkable, please continue to take your medications exactly as prescribed including Plavix and follow-up closely with your doctor within 2 days.  Return to the emergency department for severe or worsening symptoms including constant vertigo, persistent vomiting, weakness or numbness of your arms or legs or changes in vision or speech.

## 2018-10-21 ENCOUNTER — Encounter: Payer: Self-pay | Admitting: Neurology

## 2018-10-26 ENCOUNTER — Encounter: Payer: Self-pay | Admitting: Neurology

## 2018-10-26 ENCOUNTER — Ambulatory Visit: Payer: Medicare HMO | Admitting: Neurology

## 2018-10-26 VITALS — BP 144/92 | HR 68 | Ht 71.0 in | Wt 199.0 lb

## 2018-10-26 DIAGNOSIS — Z9989 Dependence on other enabling machines and devices: Secondary | ICD-10-CM

## 2018-10-26 DIAGNOSIS — G4733 Obstructive sleep apnea (adult) (pediatric): Secondary | ICD-10-CM | POA: Diagnosis not present

## 2018-10-26 NOTE — Patient Instructions (Addendum)
Please continue using your CPAP regularly. While your insurance requires that you use CPAP at least 4 hours each night on 70% of the nights, I recommend, that you not skip any nights and use it throughout the night if you can. Getting used to CPAP and staying with the treatment long term does take time and patience and discipline. Untreated obstructive sleep apnea when it is moderate to severe can have an adverse impact on cardiovascular health and raise her risk for heart disease, arrhythmias, hypertension, congestive heart failure, stroke and diabetes. Untreated obstructive sleep apnea causes sleep disruption, nonrestorative sleep, and sleep deprivation. This can have an impact on your day to day functioning and cause daytime sleepiness and impairment of cognitive function, memory loss, mood disturbance, and problems focussing. Using CPAP regularly can improve these symptoms.  Keep up the good work! We can see you in 1 year, you can see one of our nurse practitioners as you are stable. 

## 2018-10-26 NOTE — Progress Notes (Signed)
Subjective:    Patient ID: Chris [name] is a 72 y.o. male.  HPI     Interim history:   Chris [name] is a 72 year old right-handed gentleman with an underlying medical history of stroke in April 2019, hypertension, hyperlipidemia, impaired fasting glucose, kidney stones, reflux disease, diverticulosis, arthritis, and overweight state, who presents for follow-up consultation of his obstructive sleep apnea, after recent sleep study testing and starting CPAP therapy at home with new equipment. The patient is unaccompanied today. I first met him on 07/07/2018 at the request of Sherald Barge, NP, at which time the patient reported a prior diagnosis of obstructive sleep apnea but he needed reevaluation and new supplies. He was advised to proceed with sleep study testing. He had a baseline sleep study, followed by a CPAP titration study. I went over his test results with him in detail today. Baseline sleep study from 07/23/2018 showed a sleep efficiency reduced at 50.6% and he had trouble initiating sleep and maintaining sleep. He had absence of slow-wave sleep, and a markedly increased percentage of light stage sleep and a decreased percentage of REM sleep. Total AHI was in the moderate range at 28.3 per hour, average oxygen saturation 93%, nadir was 86%. He had moderate PLMS with moderate arousals. He was advised to proceed with a second sleep study for CPAP titration. He had this on 07/28/2018. Sleep efficiency was 64.2%. He had moderate to severe sleep fragmentation and longer periods of wakefulness. He was titrated via nasal mask from 5 cm to 9 cm. On the final pressure his AHI was 0.4 per hour with supine REM sleep achieved an O2 nadir of 91%. He had moderate PLMS with moderate arousals. He was advised to proceed with CPAP therapy at a pressure of 9 cm at home.  Today, 10/26/2018: I reviewed his CPAP compliance data from the past 90 days. Patient has been compliant with using his CPAP. From 08/27/2018  through 09/25/2018 he was 100% compliant with percent used days greater than 4 hours at 100%, residual AHI borderline at 4.8 per hour, leak very low, CPAP pressure of 9 cm. He reports doing well with his new CPAP machine. He has adjusted well to it. He is mostly using the new nasal mask but has also tried his old mask from before. He has no complaints regarding the new CPAP machine or the settings. He did have interim exacerbation of his vertigo and his primary care physician ordered another brain MRI which thankfully was negative for any acute findings.   The patient's allergies, current medications, family history, past medical history, past social history, past surgical history and problem list were reviewed and updated as appropriate.   Previously:  07/07/2018: (He) was previously diagnosed with obstructive sleep apnea and placed on CPAP therapy. Prior sleep study results are not available for my review today. I reviewed your office note from 04/27/2018. A CPAP compliance download from his current machine was not possible. He reports being compliant with CPAP, using it nearly nightly. He does have some issue with air leaking. He has been using a small comfort blue nasal mask. He has tried nasal pillows in the past but did not tolerate them very well. He is not aware of any family history of OSA. His Epworth sleepiness score is 4 out of 24, fatigue score is 27 out of 63. He is retired and lives with his wife, they have 2 children. He is a nonsmoker and does not utilize alcohol, does not drink caffeine on  a day-to-day basis. He is a retired Nature conservation officer. He had a TE as a child. His bedtime is typically around 9, he likes to read in bed. He does not typically watch TV in his bedroom. He is typically a sleep by 10 PM, arise time is around 8:30. He does not have any night to night nocturia. He has had occasional morning headaches especially with allergy flareup. He has difficulty tolerating his CPAP and  his allergies flare up. He also had a septoplasty in the past. He had gained weight in the past. Currently he is trying to lose weight and has lost about 15 pounds since his stroke.  His Past Medical History Is Significant For: Past Medical History:  Diagnosis Date  . Acute CVA (cerebrovascular accident) ([place]) 03/25/2018   "double vision and balance issues" (03/26/2018)  . ADD (attention deficit disorder)   . Arthritis    "thumbs and fingers" (03/26/2018)  . Diverticulosis   . GERD (gastroesophageal reflux disease)   . Heart murmur    "when I was young; not noted since age 89" (03/26/2018)  . History of kidney stones   . Hyperlipidemia   . Hypertension   . IFG (impaired fasting glucose)   . Metabolic syndrome   . OSA on CPAP   . Pneumonia    "a time or 2" (03/26/2018)  . PONV (postoperative nausea and vomiting)   . Shingles ~ 2002  . TIA (transient ischemic attack) 2013    His Past Surgical History Is Significant For: Past Surgical History:  Procedure Laterality Date  . CARPAL TUNNEL RELEASE Right   . CYSTOSCOPY W/ STONE MANIPULATION    . FINGER SURGERY Left    thumb surgery; "took bone out; put tendon in"  . TONSILLECTOMY AND ADENOIDECTOMY      His Family History Is Significant For: Family History  Problem Relation Age of Onset  . Prostate cancer Father     His Social History Is Significant For: Social History   Socioeconomic History  . Marital status: Married    Spouse name: Not on file  . Number of children: Not on file  . Years of education: Not on file  . Highest education level: Not on file  Occupational History  . Not on file  Social Needs  . Financial resource strain: Not on file  . Food insecurity:    Worry: Not on file    Inability: Not on file  . Transportation needs:    Medical: Not on file    Non-medical: Not on file  Tobacco Use  . Smoking status: Never Smoker  . Smokeless tobacco: Never Used  Substance and Sexual Activity  . Alcohol use: No     Frequency: Never  . Drug use: Never  . Sexual activity: Not on file  Lifestyle  . Physical activity:    Days per week: Not on file    Minutes per session: Not on file  . Stress: Not on file  Relationships  . Social connections:    Talks on phone: Not on file    Gets together: Not on file    Attends religious service: Not on file    Active member of club or organization: Not on file    Attends meetings of clubs or organizations: Not on file    Relationship status: Not on file  Other Topics Concern  . Not on file  Social History Narrative  . Not on file    His Allergies Are:  Allergies  Allergen Reactions  . Morphine And Related Nausea And Vomiting  :   His Current Medications Are:  Outpatient Encounter Medications as of 10/26/2018  Medication Sig  . atorvastatin (LIPITOR) 40 MG tablet Take 40 mg by mouth daily at 6 PM.   . clopidogrel (PLAVIX) 75 MG tablet Take 1 tablet (75 mg total) by mouth daily. (Patient taking differently: Take 75 mg by mouth at bedtime. )  . fluticasone (FLONASE) 50 MCG/ACT nasal spray Place 1 spray into both nostrils daily as needed for allergies.   Marland Kitchen ibuprofen (ADVIL,MOTRIN) 200 MG tablet Take 400 mg by mouth every 6 (six) hours as needed.  . pantoprazole (PROTONIX) 40 MG tablet Take 40 mg by mouth at bedtime.   Marland Kitchen telmisartan (MICARDIS) 80 MG tablet Take 80 mg by mouth at bedtime.    No facility-administered encounter medications on file as of 10/26/2018.   :  Review of Systems:  Out of a complete 14 point review of systems, all are reviewed and negative with the exception of these symptoms as listed below: Review of Systems  Neurological:       Pt presents today to discuss his cpap. Pt reports that his cpap is going well.    Objective:  Neurological Exam  Physical Exam Physical Examination:   Vitals:   10/26/18 1142  BP: (!) 144/92  Pulse: 68    General Examination: The patient is a very pleasant 72 y.o. male in no acute distress. He  appears well-developed and well-nourished and well groomed.   HEENT: Normocephalic, atraumatic, pupils are equal, round and reactive to light and accommodation. Corrective eyeglasses in place. Extraocular tracking is good without limitation to gaze excursion or nystagmus noted. Normal smooth pursuit is noted. Hearing is grossly intact with bilateral hearing aids in place. Face is symmetric with normal facial animation and normal facial sensation. Speech is clear with no dysarthria noted. There is no hypophonia. There is no lip, neck/head, jaw or voice tremor. Neck is supple with full range of passive and active motion. There are no carotid bruits on auscultation. Oropharynx exam reveals: mild mouth dryness, adequate dental hygiene and mild airway crowding, due to smaller airway entry. Tonsils are absent. Mallampati is class II. Tongue protrudes centrally and palate elevates symmetrically.   Chest: Clear to auscultation without wheezing, rhonchi or crackles noted.  Heart: S1+S2+0, regular and normal without murmurs, rubs or gallops noted.   Abdomen: Soft, non-tender and non-distended with normal bowel sounds appreciated on auscultation.  Extremities: There is no pitting edema in the distal lower extremities bilaterally.  Skin: Warm and dry without trophic changes noted.   Musculoskeletal: exam reveals no obvious joint deformities, tenderness or joint swelling or erythema.   Neurologically:  Mental status: The patient is awake, alert and oriented in all 4 spheres. His immediate and remote memory, attention, language skills and fund of knowledge are appropriate. There is no evidence of aphasia, agnosia, apraxia or anomia. Speech is clear with normal prosody and enunciation. Thought process is linear. Mood is normal and affect is normal.  Cranial nerves II - XII are as described above under HEENT exam. In addition: shoulder shrug is normal with equal shoulder height noted. Motor exam: Normal  bulk, strength and tone is noted. There is no drift, tremor or rebound. Romberg is negative. Reflexes are 1+ throughout. Fine motor skills and coordination: intact with normal finger taps, normal hand movements, normal rapid alternating patting, normal foot taps and normal foot agility.  Cerebellar testing: No dysmetria  or intention tremor. There is no truncal or gait ataxia.  Sensory exam: intact to light touch in the upper and lower extremities.  Gait, station and balance: He stands easily. No veering to one side is noted. No leaning to one side is noted. Posture is age-appropriate and stance is narrow based. Gait shows normal stride length and normal pace. No problems turning are noted. Tandem walk is somewhat challenging for him, stable.  Assessment and Plan:   In summary, Chris [name] is a very pleasant 72 year old male with an underlying medical history of stroke in April 2019, hypertension, hyperlipidemia, impaired fasting glucose, kidney stones, reflux disease, diverticulosis, arthritis, and overweight state, who presents for follow-up consultation of his obstructive sleep apnea after reevaluation with a baseline sleep study, and follow-up by a CPAP titration study, both in August 2019. His baseline sleep study confirmed moderate obstructive sleep apnea. He had PLMS during both studies, especially the second study and does endorse mild her restless leg symptoms intermittently which are currently not typically bothersome to him. He has established treatment with a new CPAP machine and is fully compliant with it. He is commended for his treatment adherence. Settings are adequate, residual AHI is in the treated range. He uses a nasal mask. He is advised to be fully compliant with treatment and follow-up routinely in one year, he can see one of our nurse practitioners as he has been stable and is not a novice to OSA treatment. We reviewed his sleep study results in detail today and also his compliance  data together. I answered all his questions today and he was in agreement. I spent 25 minutes in total face-to-face time with the patient, more than 50% of which was spent in counseling and coordination of care, reviewing test results, reviewing medication and discussing or reviewing the diagnosis of OSA, its prognosis and treatment options. Pertinent laboratory and imaging test results that were available during this visit with the patient were reviewed by me and considered in my medical decision making (see chart for details).

## 2018-10-27 DIAGNOSIS — G4733 Obstructive sleep apnea (adult) (pediatric): Secondary | ICD-10-CM | POA: Diagnosis not present

## 2018-10-30 DIAGNOSIS — R42 Dizziness and giddiness: Secondary | ICD-10-CM | POA: Diagnosis not present

## 2018-10-30 DIAGNOSIS — H532 Diplopia: Secondary | ICD-10-CM | POA: Diagnosis not present

## 2018-10-30 DIAGNOSIS — Z8673 Personal history of transient ischemic attack (TIA), and cerebral infarction without residual deficits: Secondary | ICD-10-CM | POA: Diagnosis not present

## 2018-10-30 DIAGNOSIS — R7301 Impaired fasting glucose: Secondary | ICD-10-CM | POA: Diagnosis not present

## 2018-10-30 DIAGNOSIS — E781 Pure hyperglyceridemia: Secondary | ICD-10-CM | POA: Diagnosis not present

## 2018-10-30 DIAGNOSIS — I10 Essential (primary) hypertension: Secondary | ICD-10-CM | POA: Diagnosis not present

## 2018-10-30 DIAGNOSIS — E7849 Other hyperlipidemia: Secondary | ICD-10-CM | POA: Diagnosis not present

## 2018-10-30 DIAGNOSIS — Z6828 Body mass index (BMI) 28.0-28.9, adult: Secondary | ICD-10-CM | POA: Diagnosis not present

## 2018-10-30 DIAGNOSIS — G4733 Obstructive sleep apnea (adult) (pediatric): Secondary | ICD-10-CM | POA: Diagnosis not present

## 2018-11-06 DIAGNOSIS — H532 Diplopia: Secondary | ICD-10-CM | POA: Diagnosis not present

## 2018-11-06 DIAGNOSIS — H0233 Blepharochalasis right eye, unspecified eyelid: Secondary | ICD-10-CM | POA: Diagnosis not present

## 2018-11-06 DIAGNOSIS — H0236 Blepharochalasis left eye, unspecified eyelid: Secondary | ICD-10-CM | POA: Diagnosis not present

## 2018-11-23 DIAGNOSIS — H43811 Vitreous degeneration, right eye: Secondary | ICD-10-CM | POA: Diagnosis not present

## 2018-11-23 DIAGNOSIS — H43391 Other vitreous opacities, right eye: Secondary | ICD-10-CM | POA: Diagnosis not present

## 2018-11-23 DIAGNOSIS — H2512 Age-related nuclear cataract, left eye: Secondary | ICD-10-CM | POA: Diagnosis not present

## 2018-11-23 DIAGNOSIS — H2511 Age-related nuclear cataract, right eye: Secondary | ICD-10-CM | POA: Diagnosis not present

## 2018-11-26 DIAGNOSIS — G4733 Obstructive sleep apnea (adult) (pediatric): Secondary | ICD-10-CM | POA: Diagnosis not present

## 2018-12-27 DIAGNOSIS — G4733 Obstructive sleep apnea (adult) (pediatric): Secondary | ICD-10-CM | POA: Diagnosis not present

## 2019-01-12 DIAGNOSIS — H43811 Vitreous degeneration, right eye: Secondary | ICD-10-CM | POA: Diagnosis not present

## 2019-01-12 DIAGNOSIS — Z885 Allergy status to narcotic agent status: Secondary | ICD-10-CM | POA: Diagnosis not present

## 2019-01-12 DIAGNOSIS — H5053 Vertical heterophoria: Secondary | ICD-10-CM | POA: Diagnosis not present

## 2019-01-12 DIAGNOSIS — H5052 Exophoria: Secondary | ICD-10-CM | POA: Diagnosis not present

## 2019-01-20 ENCOUNTER — Encounter: Payer: Self-pay | Admitting: Internal Medicine

## 2019-01-20 ENCOUNTER — Ambulatory Visit: Payer: Medicare HMO | Admitting: Internal Medicine

## 2019-01-20 VITALS — BP 126/84 | HR 70 | Ht 71.0 in | Wt 203.0 lb

## 2019-01-20 DIAGNOSIS — K219 Gastro-esophageal reflux disease without esophagitis: Secondary | ICD-10-CM | POA: Diagnosis not present

## 2019-01-20 DIAGNOSIS — Z1211 Encounter for screening for malignant neoplasm of colon: Secondary | ICD-10-CM

## 2019-01-20 DIAGNOSIS — R1310 Dysphagia, unspecified: Secondary | ICD-10-CM

## 2019-01-20 DIAGNOSIS — Z7902 Long term (current) use of antithrombotics/antiplatelets: Secondary | ICD-10-CM

## 2019-01-20 NOTE — Progress Notes (Signed)
HISTORY OF PRESENT ILLNESS:  Chris [name] is a 73 y.o. male, retired Immunologist, who was evaluated in this office March 09, 2018 regarding chronic GERD with intermittent solid food dysphasia and the need for follow-up screening colonoscopy.  Those examinations were not performed as he suffered a stroke the following month.  Reevaluated in this office June 02, 2018.  At that time doing better on PPI with resolution of GERD symptoms and improvement in swallowing.  GI review of systems was otherwise negative except for intermittent loose stools.  Decision was made to continue his Plavix with office reevaluation in 6 months.  He presents at this time.  The patient has had no interval medical problems.  He continues on Plavix.  He continues with intermittent loose stools without change.  No bleeding.  His reflux symptoms remain improved on pantoprazole 40 mg daily.  He still describes a sensation of slight dysphasia with food passing through the esophagus slowly.  He has had colonoscopy April 2009 which was normal except for sigmoid diverticulosis and diminutive hyperplastic polyps.  No prior EGD.  REVIEW OF SYSTEMS:  All non-GI ROS negative unless otherwise stated in the HPI except for sinus and allergy, arthritis, visual change, headaches  Past Medical History:  Diagnosis Date  . Acute CVA (cerebrovascular accident) ([place]) 03/25/2018   "double vision and balance issues" (03/26/2018)  . ADD (attention deficit disorder)   . Arthritis    "thumbs and fingers" (03/26/2018)  . Diverticulosis   . GERD (gastroesophageal reflux disease)   . Heart murmur    "when I was young; not noted since age 63" (03/26/2018)  . History of kidney stones   . Hyperlipidemia   . Hypertension   . IFG (impaired fasting glucose)   . Metabolic syndrome   . OSA on CPAP   . Pneumonia    "a time or 2" (03/26/2018)  . PONV (postoperative nausea and vomiting)   . Shingles ~ 2002  . TIA (transient ischemic attack) 2013     Past Surgical History:  Procedure Laterality Date  . CARPAL TUNNEL RELEASE Right   . CYSTOSCOPY W/ STONE MANIPULATION    . FINGER SURGERY Left    thumb surgery; "took bone out; put tendon in"  . TONSILLECTOMY AND ADENOIDECTOMY      Social History Chris [name]  reports that he has never smoked. He has never used smokeless tobacco. He reports that he does not drink alcohol or use drugs.  family history includes Prostate cancer in his father.  Allergies  Allergen Reactions  . Morphine And Related Nausea And Vomiting       PHYSICAL EXAMINATION: Vital signs: BP 126/84   Pulse 70   Ht 5\' 11"  (1.803 m)   Wt 203 lb (92.1 kg)   BMI 28.31 kg/m   Constitutional: generally well-appearing, no acute distress Psychiatric: alert and oriented x3, cooperative Eyes: extraocular movements intact, anicteric, conjunctiva pink Mouth: oral pharynx moist, no lesions Neck: supple no lymphadenopathy Cardiovascular: heart regular rate and rhythm, no murmur Lungs: clear to auscultation bilaterally Abdomen: soft, nontender, nondistended, no obvious ascites, no peritoneal signs, normal bowel sounds, no organomegaly Rectal: Deferred until colonoscopy Extremities: no clubbing, cyanosis, or lower extremity edema bilaterally Skin: no lesions on visible extremities Neuro: No focal deficits.  Cranial nerves intact  ASSESSMENT:  1.  Colon cancer screening.  Appropriate candidate without contraindication.  Negative examination 2009 (diminutive hyperplastic polyps, sigmoid diverticulosis).  Due for follow-up.  Optical colonoscopy is his preferred screening  strategy 2.  GERD.  Classic symptoms improved on PPI 3.  Mild dysphasia.  Needs evaluated.  Rule out stricture 4.  Loose stools.  Nonspecific.  Can evaluate at time of screening colonoscopy 5.  General medical problems including history of stroke for which she is on Plavix  PLAN:  1.  Screening colonoscopy.  The patient is HIGH RISK given his  comorbidities and considerations related to chronic Plavix therapy.The nature of the procedure, as well as the risks, benefits, and alternatives were carefully and thoroughly reviewed with the patient. Ample time for discussion and questions allowed. The patient understood, was satisfied, and agreed to proceed. 2.  Upper endoscopy to evaluate dysphasia.  Patient is high risk as above.The nature of the procedure, as well as the risks, benefits, and alternatives were carefully and thoroughly reviewed with the patient. Ample time for discussion and questions allowed. The patient understood, was satisfied, and agreed to proceed. 3.  Continue pantoprazole 40 mg daily 4.  Reflux precautions 5.  Consider bulking agent for loose stools 6.  The patient will STAY ON PLAVIX.  The risks (increased bleeding risk) and limitations (limited degree of aggressive therapeutics) of this strategy were reviewed.  A copy of this note has been sent to Dr. Brigitte Pulse

## 2019-01-20 NOTE — Patient Instructions (Signed)

## 2019-01-26 ENCOUNTER — Encounter: Payer: Self-pay | Admitting: Internal Medicine

## 2019-01-26 ENCOUNTER — Ambulatory Visit (AMBULATORY_SURGERY_CENTER): Payer: Medicare HMO | Admitting: Internal Medicine

## 2019-01-26 VITALS — BP 108/48 | HR 72 | Temp 96.0°F | Resp 16 | Ht 71.0 in | Wt 203.0 lb

## 2019-01-26 DIAGNOSIS — Z1211 Encounter for screening for malignant neoplasm of colon: Secondary | ICD-10-CM | POA: Diagnosis not present

## 2019-01-26 DIAGNOSIS — K219 Gastro-esophageal reflux disease without esophagitis: Secondary | ICD-10-CM | POA: Diagnosis not present

## 2019-01-26 DIAGNOSIS — K222 Esophageal obstruction: Secondary | ICD-10-CM

## 2019-01-26 DIAGNOSIS — K449 Diaphragmatic hernia without obstruction or gangrene: Secondary | ICD-10-CM | POA: Diagnosis not present

## 2019-01-26 DIAGNOSIS — E669 Obesity, unspecified: Secondary | ICD-10-CM | POA: Diagnosis not present

## 2019-01-26 DIAGNOSIS — R1310 Dysphagia, unspecified: Secondary | ICD-10-CM | POA: Diagnosis not present

## 2019-01-26 DIAGNOSIS — Z8673 Personal history of transient ischemic attack (TIA), and cerebral infarction without residual deficits: Secondary | ICD-10-CM | POA: Diagnosis not present

## 2019-01-26 DIAGNOSIS — D123 Benign neoplasm of transverse colon: Secondary | ICD-10-CM

## 2019-01-26 DIAGNOSIS — I10 Essential (primary) hypertension: Secondary | ICD-10-CM | POA: Diagnosis not present

## 2019-01-26 DIAGNOSIS — G4733 Obstructive sleep apnea (adult) (pediatric): Secondary | ICD-10-CM | POA: Diagnosis not present

## 2019-01-26 MED ORDER — SODIUM CHLORIDE 0.9 % IV SOLN: 500.0000 mL | Freq: Once | INTRAVENOUS | Status: DC

## 2019-01-26 NOTE — Patient Instructions (Signed)
Please read handouts provided. Continue present medications. Resume Plavix today at prior dose. Await pathology results. Reflux precautions. Continue pantoprazole 40 mg daily. If swallowing difficulties worsen, please contact GI office. Resume general medical care with Dr. Brigitte Pulse.       YOU HAD AN ENDOSCOPIC PROCEDURE TODAY AT [facility]:   Refer to the procedure report that was given to you for any specific questions about what was found during the examination.  If the procedure report does not answer your questions, please call your gastroenterologist to clarify.  If you requested that your care partner not be given the details of your procedure findings, then the procedure report has been included in a sealed envelope for you to review at your convenience later.  YOU SHOULD EXPECT: Some feelings of bloating in the abdomen. Passage of more gas than usual.  Walking can help get rid of the air that was put into your GI tract during the procedure and reduce the bloating. If you had a lower endoscopy (such as a colonoscopy or flexible sigmoidoscopy) you may notice spotting of blood in your stool or on the toilet paper. If you underwent a bowel prep for your procedure, you may not have a normal bowel movement for a few days.  Please Note:  You might notice some irritation and congestion in your nose or some drainage.  This is from the oxygen used during your procedure.  There is no need for concern and it should clear up in a day or so.  SYMPTOMS TO REPORT IMMEDIATELY:   Following lower endoscopy (colonoscopy or flexible sigmoidoscopy):  Excessive amounts of blood in the stool  Significant tenderness or worsening of abdominal pains  Swelling of the abdomen that is new, acute  Fever of 100F or higher   Following upper endoscopy (EGD)  Vomiting of blood or coffee ground material  New chest pain or pain under the shoulder blades  Painful or persistently difficult  swallowing  New shortness of breath  Fever of 100F or higher  Black, tarry-looking stools  For urgent or emergent issues, a gastroenterologist can be reached at any hour by calling [phone].   DIET:  We do recommend a small meal at first, but then you may proceed to your regular diet.  Drink plenty of fluids but you should avoid alcoholic beverages for 24 hours.  ACTIVITY:  You should plan to take it easy for the rest of today and you should NOT DRIVE or use heavy machinery until tomorrow (because of the sedation medicines used during the test).    FOLLOW UP: Our staff will call the number listed on your records the next business day following your procedure to check on you and address any questions or concerns that you may have regarding the information given to you following your procedure. If we do not reach you, we will leave a message.  However, if you are feeling well and you are not experiencing any problems, there is no need to return our call.  We will assume that you have returned to your regular daily activities without incident.  If any biopsies were taken you will be contacted by phone or by letter within the next 1-3 weeks.  Please call us at [phone] if you have not heard about the biopsies in 3 weeks.    SIGNATURES/CONFIDENTIALITY: You and/or your care partner have signed paperwork which will be entered into your electronic medical record.  These signatures attest to the fact  that that the information above on your After Visit Summary has been reviewed and is understood.  Full responsibility of the confidentiality of this discharge information lies with you and/or your care-partner.

## 2019-01-26 NOTE — Op Note (Signed)
[name] Procedure Date: 01/26/2019 2:11 PM MRN: [id number] Endoscopist: Docia Chuck. Chris [name] , MD Age: 73 Referring MD:  Date of Birth: 21-Jul-1946 Gender: Male Account #: [id number] Procedure:                Colonoscopy with cold snare polypectomy x 1 Indications:              Screening for colorectal malignant neoplasm.                            Negative index examination 2009 Medicines:                Monitored Anesthesia Care Procedure:                Pre-Anesthesia Assessment:                           - Prior to the procedure, a History and Physical                            was performed, and patient medications and                            allergies were reviewed. The patient's tolerance of                            previous anesthesia was also reviewed. The risks                            and benefits of the procedure and the sedation                            options and risks were discussed with the patient.                            All questions were answered, and informed consent                            was obtained. Prior Anticoagulants: The patient has                            taken Plavix (clopidogrel), last dose was 1 day                            prior to procedure. ASA Grade Assessment: III - A                            patient with severe systemic disease. After                            reviewing the risks and benefits, the patient was                            deemed in satisfactory condition to undergo the  procedure.                           After obtaining informed consent, the colonoscope                            was passed under direct vision. Throughout the                            procedure, the patient's blood pressure, pulse, and                            oxygen saturations were monitored continuously. The                            Colonoscope was introduced through the  anus and                            advanced to the the cecum, identified by                            appendiceal orifice and ileocecal valve. The                            ileocecal valve, appendiceal orifice, and rectum                            were photographed. The quality of the bowel                            preparation was excellent. The colonoscopy was                            performed without difficulty. The patient tolerated                            the procedure well. The bowel preparation used was                            SUPREP. Scope In: 2:16:12 PM Scope Out: 2:31:06 PM Scope Withdrawal Time: 0 hours 11 minutes 58 seconds  Total Procedure Duration: 0 hours 14 minutes 54 seconds  Findings:                 A 3 mm polyp was found in the transverse colon. The                            polyp was sessile. The polyp was removed with a                            cold snare. Resection and retrieval were complete.                           Multiple diverticula were found in the ascending  colon and left colon.                           The exam was otherwise without abnormality on                            direct and retroflexion views. Complications:            No immediate complications. Estimated blood loss:                            None. Estimated Blood Loss:     Estimated blood loss: none. Impression:               - One 3 mm polyp in the transverse colon, removed                            with a cold snare. Resected and retrieved.                           - Diverticulosis in the ascending colon and in the                            left colon.                           - The examination was otherwise normal on direct                            and retroflexion views. Recommendation:           - Repeat colonoscopy is not recommended for                            surveillance.                           - Resume Plavix (clopidogrel)  today at prior dose.                           - Patient has a contact number available for                            emergencies. The signs and symptoms of potential                            delayed complications were discussed with the                            patient. Return to normal activities tomorrow.                            Written discharge instructions were provided to the                            patient.                           -  Resume previous diet.                           - Continue present medications.                           - Await pathology results. Docia Chuck. Chris Pastor, MD 01/26/2019 2:37:00 PM This report has been signed electronically.

## 2019-01-26 NOTE — Progress Notes (Signed)
Pt's states no medical or surgical changes since previsit or office visit. 

## 2019-01-26 NOTE — Progress Notes (Signed)
Called to room to assist during endoscopic procedure.  Patient ID and intended procedure confirmed with present staff. Received instructions for my participation in the procedure from the performing physician.  

## 2019-01-26 NOTE — Progress Notes (Signed)
Report to PACU, RN, vss, BBS= Clear.  

## 2019-01-26 NOTE — Op Note (Signed)
[place] Patient Name: Chris [name] Procedure Date: 01/26/2019 2:12 PM MRN: [id number] Endoscopist: Docia Chuck. Henrene Pastor , MD Age: 73 Referring MD:  Date of Birth: 26-Jan-1946 Gender: Male Account #: [id number] Procedure:                Upper GI endoscopy Indications:              Dysphagia (substantially improved after regular PPI                            therapy), Esophageal reflux Medicines:                Monitored Anesthesia Care Procedure:                Pre-Anesthesia Assessment:                           - Prior to the procedure, a History and Physical                            was performed, and patient medications and                            allergies were reviewed. The patient's tolerance of                            previous anesthesia was also reviewed. The risks                            and benefits of the procedure and the sedation                            options and risks were discussed with the patient.                            All questions were answered, and informed consent                            was obtained. Prior Anticoagulants: The patient has                            taken Plavix (clopidogrel), last dose was 1 day                            prior to procedure. ASA Grade Assessment: III - A                            patient with severe systemic disease. After                            reviewing the risks and benefits, the patient was                            deemed in satisfactory condition to undergo the  procedure.                           After obtaining informed consent, the endoscope was                            passed under direct vision. Throughout the                            procedure, the patient's blood pressure, pulse, and                            oxygen saturations were monitored continuously. The                            Endoscope was introduced through the mouth, and            advanced to the second part of duodenum. The upper                            GI endoscopy was accomplished without difficulty.                            The patient tolerated the procedure well. Scope In: Scope Out: Findings:                 The esophagus revealed a large caliber distal                            esophageal stricture without resistance. No active                            inflammation.                           The stomach revealed multiple diminutive benign                            fundic gland type polyps and a small sliding hiatal                            hernia.                           The examined duodenum was normal.                           The cardia and gastric fundus were normal on                            retroflexion. Complications:            No immediate complications. Estimated Blood Loss:     Estimated blood loss: none. Impression:               1. GERD  2. Large caliber distal esophageal stricture.                            Dysphasia improved on PPI. Not dilated on Plavix. Recommendation:           1. Reflux precautions                           2. Continue pantoprazole 40 mg daily                           3. Chew food well                           4. If swallowing difficulties worsen please contact                            the office                           5. Resume general medical care with Dr. Brigitte Pulse. GI                            follow-up as needed. Docia Chuck. Henrene Pastor, MD 01/26/2019 2:46:49 PM This report has been signed electronically.

## 2019-01-27 ENCOUNTER — Telehealth: Payer: Self-pay | Admitting: *Deleted

## 2019-01-27 DIAGNOSIS — G4733 Obstructive sleep apnea (adult) (pediatric): Secondary | ICD-10-CM | POA: Diagnosis not present

## 2019-01-27 NOTE — Telephone Encounter (Signed)
  Follow up Call-  Call back number 01/26/2019  Post procedure Call Back phone  # [phone]  Permission to leave phone message Yes  Some recent data might be hidden     Patient questions:  Do you have a fever, pain , or abdominal swelling? No. Pain Score  0 *  Have you tolerated food without any problems? Yes.    Have you been able to return to your normal activities? Yes.    Do you have any questions about your discharge instructions: Diet   No. Medications  No. Follow up visit  No.  Do you have questions or concerns about your Care? No.  Actions: * If pain score is 4 or above: No action needed, pain <4.

## 2019-01-29 ENCOUNTER — Encounter: Payer: Self-pay | Admitting: Internal Medicine

## 2019-01-29 DIAGNOSIS — G4733 Obstructive sleep apnea (adult) (pediatric): Secondary | ICD-10-CM | POA: Diagnosis not present

## 2019-02-12 DIAGNOSIS — G4733 Obstructive sleep apnea (adult) (pediatric): Secondary | ICD-10-CM | POA: Diagnosis not present

## 2019-02-25 DIAGNOSIS — G4733 Obstructive sleep apnea (adult) (pediatric): Secondary | ICD-10-CM | POA: Diagnosis not present

## 2019-03-27 IMAGING — MR MR HEAD W/O CM
4 series · 48 of 48 positions shown · non-contrast
Comparison: Head CT 03/25/2018

CLINICAL DATA: Nausea, vomiting and dizziness.

EXAM:
MRI HEAD WITHOUT CONTRAST
TECHNIQUE: Multiplanar, multiecho pulse sequences of the brain and surrounding
structures were obtained without intravenous contrast.

[Series 5001: ax dwi_tracew · axial · 3.0mm · 1.50mm/px · z∈[-21,+118]mm · 17 of 80 slices shown]
[im 1/80]
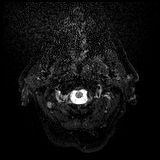
[im 5/80]
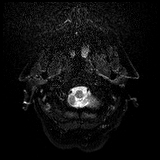
[im 10/80]
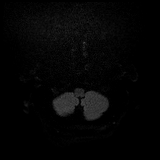
[im 15/80]
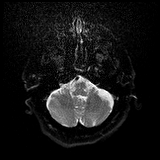
[im 20/80]
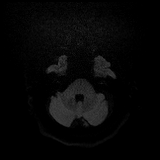
[im 25/80]
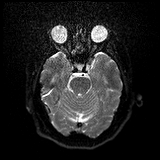
[im 30/80]
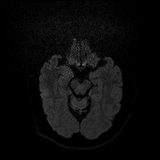
[im 35/80]
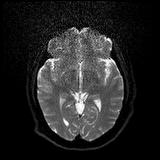
[im 40/80]
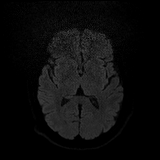
[im 45/80]
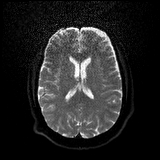
[im 50/80]
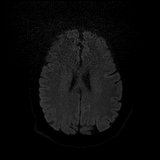
[im 55/80]
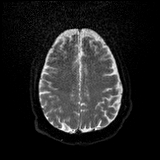
[im 60/80]
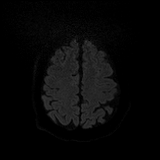
[im 65/80]
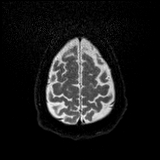
[im 70/80]
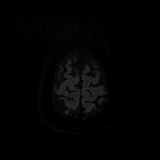
[im 75/80]
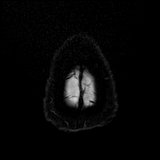
[im 80/80]
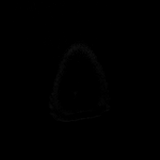

[Series 6001: ax dwi_adc · axial · 3.0mm · 1.50mm/px · z∈[-21,+118]mm · 9 of 40 slices shown]
[im 1/40]
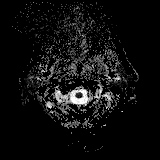
[im 5/40]
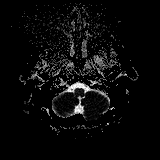
[im 10/40]
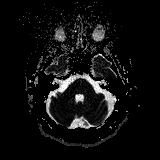
[im 15/40]
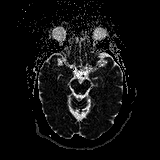
[im 20/40]
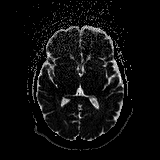
[im 25/40]
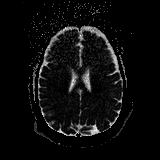
[im 30/40]
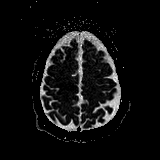
[im 35/40]
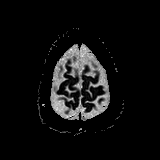
[im 40/40]
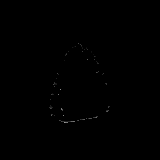

[Series 7001: cor dwi_tracew · coronal · 5.0mm · 1.44mm/px · 15 of 70 slices shown]
[im 1/70]
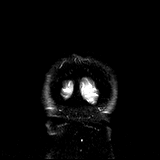
[im 5/70]
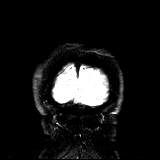
[im 10/70]
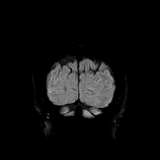
[im 15/70]
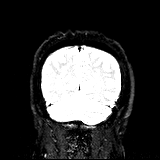
[im 20/70]
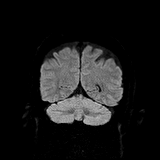
[im 25/70]
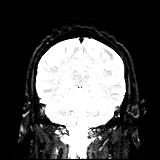
[im 30/70]
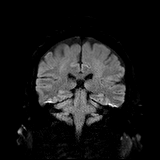
[im 35/70]
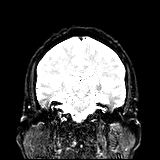
[im 40/70]
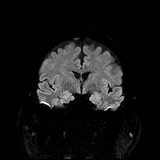
[im 45/70]
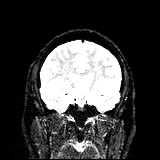
[im 50/70]
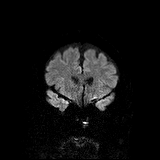
[im 55/70]
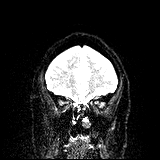
[im 60/70]
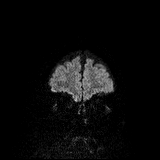
[im 65/70]
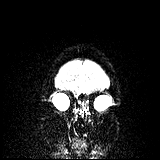
[im 70/70]
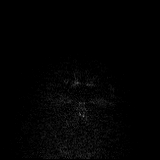

[Series 8001: cor dwi_adc · coronal · 5.0mm · 1.44mm/px · 7 of 35 slices shown]
[im 1/35]
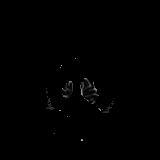
[im 6/35]
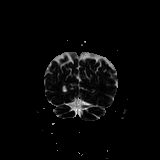
[im 12/35]
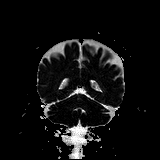
[im 18/35]
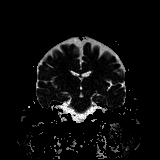
[im 23/35]
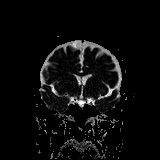
[im 29/35]
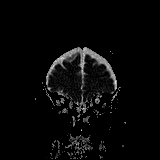
[im 35/35]
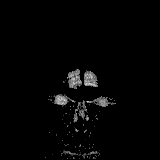

[48 of 48 positions shown; findings below may reference images not displayed]

FINDINGS: BRAIN: The midline structures are normal. There is a punctate focus
of abnormal diffusion restriction within the superior left parietal
white matter, measuring approximately 5 mm. No associated hemorrhage
or mass effect. No mass lesion, hydrocephalus, dural abnormality or
extra-axial collection. The brain parenchymal signal is normal for
the patient's age. No age-advanced or lobar predominant atrophy. No
chronic microhemorrhage or superficial siderosis.

VASCULAR: Major intracranial arterial and venous sinus flow voids
are preserved.

SKULL AND UPPER CERVICAL SPINE: The visualized skull base,
calvarium, upper cervical spine and extracranial soft tissues are
normal.

SINUSES/ORBITS: No fluid levels or advanced mucosal thickening. No
mastoid or middle ear effusion. Normal orbits.
IMPRESSION: Punctate focus of acute ischemia within the left parietal white
matter, without hemorrhage or mass effect. Otherwise normal MRI of
the brain.

## 2019-03-28 DIAGNOSIS — G4733 Obstructive sleep apnea (adult) (pediatric): Secondary | ICD-10-CM | POA: Diagnosis not present

## 2019-04-14 DIAGNOSIS — G4733 Obstructive sleep apnea (adult) (pediatric): Secondary | ICD-10-CM | POA: Diagnosis not present

## 2019-04-27 DIAGNOSIS — G4733 Obstructive sleep apnea (adult) (pediatric): Secondary | ICD-10-CM | POA: Diagnosis not present

## 2019-05-11 DIAGNOSIS — R69 Illness, unspecified: Secondary | ICD-10-CM | POA: Diagnosis not present

## 2019-05-20 DIAGNOSIS — R7301 Impaired fasting glucose: Secondary | ICD-10-CM | POA: Diagnosis not present

## 2019-05-20 DIAGNOSIS — R82998 Other abnormal findings in urine: Secondary | ICD-10-CM | POA: Diagnosis not present

## 2019-05-20 DIAGNOSIS — Z125 Encounter for screening for malignant neoplasm of prostate: Secondary | ICD-10-CM | POA: Diagnosis not present

## 2019-05-20 DIAGNOSIS — E7849 Other hyperlipidemia: Secondary | ICD-10-CM | POA: Diagnosis not present

## 2019-05-20 DIAGNOSIS — I10 Essential (primary) hypertension: Secondary | ICD-10-CM | POA: Diagnosis not present

## 2019-05-26 DIAGNOSIS — G4733 Obstructive sleep apnea (adult) (pediatric): Secondary | ICD-10-CM | POA: Diagnosis not present

## 2019-05-26 DIAGNOSIS — Z8673 Personal history of transient ischemic attack (TIA), and cerebral infarction without residual deficits: Secondary | ICD-10-CM | POA: Diagnosis not present

## 2019-05-26 DIAGNOSIS — E8881 Metabolic syndrome: Secondary | ICD-10-CM | POA: Diagnosis not present

## 2019-05-26 DIAGNOSIS — K219 Gastro-esophageal reflux disease without esophagitis: Secondary | ICD-10-CM | POA: Diagnosis not present

## 2019-05-26 DIAGNOSIS — R1310 Dysphagia, unspecified: Secondary | ICD-10-CM | POA: Diagnosis not present

## 2019-05-26 DIAGNOSIS — R270 Ataxia, unspecified: Secondary | ICD-10-CM | POA: Diagnosis not present

## 2019-05-26 DIAGNOSIS — R7301 Impaired fasting glucose: Secondary | ICD-10-CM | POA: Diagnosis not present

## 2019-05-26 DIAGNOSIS — Z0000 Encounter for general adult medical examination without abnormal findings: Secondary | ICD-10-CM | POA: Diagnosis not present

## 2019-05-26 DIAGNOSIS — E785 Hyperlipidemia, unspecified: Secondary | ICD-10-CM | POA: Diagnosis not present

## 2019-05-26 DIAGNOSIS — I10 Essential (primary) hypertension: Secondary | ICD-10-CM | POA: Diagnosis not present

## 2019-05-28 DIAGNOSIS — G4733 Obstructive sleep apnea (adult) (pediatric): Secondary | ICD-10-CM | POA: Diagnosis not present

## 2019-07-23 DIAGNOSIS — H532 Diplopia: Secondary | ICD-10-CM | POA: Diagnosis not present

## 2019-07-23 DIAGNOSIS — H5213 Myopia, bilateral: Secondary | ICD-10-CM | POA: Diagnosis not present

## 2019-07-23 DIAGNOSIS — H2513 Age-related nuclear cataract, bilateral: Secondary | ICD-10-CM | POA: Diagnosis not present

## 2019-07-23 DIAGNOSIS — G4733 Obstructive sleep apnea (adult) (pediatric): Secondary | ICD-10-CM | POA: Diagnosis not present

## 2019-08-05 DIAGNOSIS — H2511 Age-related nuclear cataract, right eye: Secondary | ICD-10-CM | POA: Diagnosis not present

## 2019-08-05 DIAGNOSIS — H25811 Combined forms of age-related cataract, right eye: Secondary | ICD-10-CM | POA: Diagnosis not present

## 2019-08-10 DIAGNOSIS — L821 Other seborrheic keratosis: Secondary | ICD-10-CM | POA: Diagnosis not present

## 2019-08-10 DIAGNOSIS — D225 Melanocytic nevi of trunk: Secondary | ICD-10-CM | POA: Diagnosis not present

## 2019-08-10 DIAGNOSIS — Z1283 Encounter for screening for malignant neoplasm of skin: Secondary | ICD-10-CM | POA: Diagnosis not present

## 2019-08-10 DIAGNOSIS — D485 Neoplasm of uncertain behavior of skin: Secondary | ICD-10-CM | POA: Diagnosis not present

## 2019-08-12 DIAGNOSIS — H25812 Combined forms of age-related cataract, left eye: Secondary | ICD-10-CM | POA: Diagnosis not present

## 2019-08-12 DIAGNOSIS — H2512 Age-related nuclear cataract, left eye: Secondary | ICD-10-CM | POA: Diagnosis not present

## 2019-08-20 DIAGNOSIS — Z0100 Encounter for examination of eyes and vision without abnormal findings: Secondary | ICD-10-CM | POA: Diagnosis not present

## 2019-09-17 DIAGNOSIS — H35371 Puckering of macula, right eye: Secondary | ICD-10-CM | POA: Diagnosis not present

## 2019-09-25 DIAGNOSIS — Z23 Encounter for immunization: Secondary | ICD-10-CM | POA: Diagnosis not present

## 2019-10-12 DIAGNOSIS — E785 Hyperlipidemia, unspecified: Secondary | ICD-10-CM | POA: Diagnosis not present

## 2019-10-12 DIAGNOSIS — I10 Essential (primary) hypertension: Secondary | ICD-10-CM | POA: Diagnosis not present

## 2019-10-12 DIAGNOSIS — L570 Actinic keratosis: Secondary | ICD-10-CM | POA: Diagnosis not present

## 2019-10-12 DIAGNOSIS — Z7722 Contact with and (suspected) exposure to environmental tobacco smoke (acute) (chronic): Secondary | ICD-10-CM | POA: Diagnosis not present

## 2019-10-12 DIAGNOSIS — K219 Gastro-esophageal reflux disease without esophagitis: Secondary | ICD-10-CM | POA: Diagnosis not present

## 2019-10-12 DIAGNOSIS — Z008 Encounter for other general examination: Secondary | ICD-10-CM | POA: Diagnosis not present

## 2019-10-12 DIAGNOSIS — Z809 Family history of malignant neoplasm, unspecified: Secondary | ICD-10-CM | POA: Diagnosis not present

## 2019-10-12 DIAGNOSIS — N529 Male erectile dysfunction, unspecified: Secondary | ICD-10-CM | POA: Diagnosis not present

## 2019-10-12 DIAGNOSIS — Z1283 Encounter for screening for malignant neoplasm of skin: Secondary | ICD-10-CM | POA: Diagnosis not present

## 2019-10-12 DIAGNOSIS — D225 Melanocytic nevi of trunk: Secondary | ICD-10-CM | POA: Diagnosis not present

## 2019-10-12 DIAGNOSIS — E669 Obesity, unspecified: Secondary | ICD-10-CM | POA: Diagnosis not present

## 2019-10-12 DIAGNOSIS — Z6830 Body mass index (BMI) 30.0-30.9, adult: Secondary | ICD-10-CM | POA: Diagnosis not present

## 2019-10-12 DIAGNOSIS — X32XXXD Exposure to sunlight, subsequent encounter: Secondary | ICD-10-CM | POA: Diagnosis not present

## 2019-10-12 DIAGNOSIS — Z8249 Family history of ischemic heart disease and other diseases of the circulatory system: Secondary | ICD-10-CM | POA: Diagnosis not present

## 2019-10-12 DIAGNOSIS — Z7902 Long term (current) use of antithrombotics/antiplatelets: Secondary | ICD-10-CM | POA: Diagnosis not present

## 2019-10-28 ENCOUNTER — Encounter: Payer: Self-pay | Admitting: Adult Health

## 2019-10-28 ENCOUNTER — Ambulatory Visit: Payer: Medicare HMO | Admitting: Adult Health

## 2019-10-28 ENCOUNTER — Other Ambulatory Visit: Payer: Self-pay

## 2019-10-28 VITALS — BP 114/67 | HR 75 | Temp 98.0°F | Ht 70.0 in | Wt 204.6 lb

## 2019-10-28 DIAGNOSIS — Z9989 Dependence on other enabling machines and devices: Secondary | ICD-10-CM

## 2019-10-28 DIAGNOSIS — G4733 Obstructive sleep apnea (adult) (pediatric): Secondary | ICD-10-CM

## 2019-10-28 NOTE — Patient Instructions (Signed)

## 2019-10-28 NOTE — Progress Notes (Addendum)
PATIENT: Chris [name] DOB: 10/04/1946  REASON FOR VISIT: follow up HISTORY FROM: patient  HISTORY OF PRESENT ILLNESS: Today 10/28/19:  [name] is a 73 year old male with a history of obstructive sleep apnea on CPAP.  His download indicates that he use his machine nightly for compliance of 100%.  He uses machine greater than 4 hours each night.  On average he uses his machine 9 hours and 30 minutes.  His residual AHI is 6.7 on 9 cm of water.  He states that the CPAP continues to work well for him.  He states on occasion he will feel his mask leaking but nothing significant.  He returns today for an evaluation.   REVIEW OF SYSTEMS: Out of a complete 14 system review of symptoms, the patient complains only of the following symptoms, and all other reviewed systems are negative.  See HPI  ALLERGIES: Allergies  Allergen Reactions  . Morphine And Related Nausea And Vomiting    HOME MEDICATIONS: Outpatient Medications Prior to Visit  Medication Sig Dispense Refill  . atorvastatin (LIPITOR) 40 MG tablet Take 40 mg by mouth daily at 6 PM.     . clopidogrel (PLAVIX) 75 MG tablet Take 1 tablet (75 mg total) by mouth daily. (Patient taking differently: Take 75 mg by mouth at bedtime. ) 30 tablet 0  . fluticasone (FLONASE) 50 MCG/ACT nasal spray Place 1 spray into both nostrils daily as needed for allergies.     . hydrochlorothiazide (HYDRODIURIL) 12.5 MG tablet Take 12.5 mg by mouth every morning.    Marland Kitchen ibuprofen (ADVIL,MOTRIN) 200 MG tablet Take 400 mg by mouth every 6 (six) hours as needed.    . pantoprazole (PROTONIX) 40 MG tablet Take 40 mg by mouth at bedtime.     Marland Kitchen telmisartan (MICARDIS) 80 MG tablet Take 80 mg by mouth at bedtime.      No facility-administered medications prior to visit.     PAST MEDICAL HISTORY: Past Medical History:  Diagnosis Date  . Acute CVA (cerebrovascular accident) ([place]) 03/25/2018   "double vision and balance issues" (03/26/2018)  . ADD (attention  deficit disorder)   . Arthritis    "thumbs and fingers" (03/26/2018)  . Diverticulosis   . GERD (gastroesophageal reflux disease)   . Heart murmur    "when I was young; not noted since age 65" (03/26/2018)  . History of kidney stones   . Hyperlipidemia   . Hypertension   . IFG (impaired fasting glucose)   . Metabolic syndrome   . OSA on CPAP   . Pneumonia    "a time or 2" (03/26/2018)  . PONV (postoperative nausea and vomiting)   . Shingles ~ 2002  . TIA (transient ischemic attack) 2013    PAST SURGICAL HISTORY: Past Surgical History:  Procedure Laterality Date  . CARPAL TUNNEL RELEASE Right   . CYSTOSCOPY W/ STONE MANIPULATION    . FINGER SURGERY Left    thumb surgery; "took bone out; put tendon in"  . TONSILLECTOMY AND ADENOIDECTOMY      FAMILY HISTORY: Family History  Problem Relation Age of Onset  . Prostate cancer Father   . Colon cancer Neg Hx   . Esophageal cancer Neg Hx   . Liver cancer Neg Hx   . Rectal cancer Neg Hx     SOCIAL HISTORY: Social History   Socioeconomic History  . Marital status: Married    Spouse name: Not on file  . Number of children: Not on file  .  Years of education: Not on file  . Highest education level: Not on file  Occupational History  . Not on file  Social Needs  . Financial resource strain: Not on file  . Food insecurity    Worry: Not on file    Inability: Not on file  . Transportation needs    Medical: Not on file    Non-medical: Not on file  Tobacco Use  . Smoking status: Never Smoker  . Smokeless tobacco: Never Used  Substance and Sexual Activity  . Alcohol use: No    Frequency: Never  . Drug use: Never  . Sexual activity: Not on file  Lifestyle  . Physical activity    Days per week: Not on file    Minutes per session: Not on file  . Stress: Not on file  Relationships  . Social Herbalist on phone: Not on file    Gets together: Not on file    Attends religious service: Not on file    Active member  of club or organization: Not on file    Attends meetings of clubs or organizations: Not on file    Relationship status: Not on file  . Intimate partner violence    Fear of current or ex partner: Not on file    Emotionally abused: Not on file    Physically abused: Not on file    Forced sexual activity: Not on file  Other Topics Concern  . Not on file  Social History Narrative  . Not on file      PHYSICAL EXAM  Vitals:   10/28/19 1207  BP: 114/67  Pulse: 75  Temp: 98 F (36.7 C)  Weight: 204 lb 9.6 oz (92.8 kg)  Height: 5\' 10"  (1.778 m)   Body mass index is 29.36 kg/m. Generalized: Well developed, in no acute distress  Chest: Lungs clear to auscultation bilaterally  Neurological examination  Mentation: Alert oriented to time, place, history taking. Follows all commands speech and language fluent Cranial nerve II-XII: Extraocular movements were full, visual field were full on confrontational test Head turning and shoulder shrug  were normal and symmetric. Motor: The motor testing reveals 5 over 5 strength of all 4 extremities. Good symmetric motor tone is noted throughout.  Sensory: Sensory testing is intact to soft touch on all 4 extremities. No evidence of extinction is noted.  Gait and station: Gait is normal.    DIAGNOSTIC DATA (LABS, IMAGING, TESTING) - I reviewed patient records, labs, notes, testing and imaging myself where available.  Lab Results  Component Value Date   WBC 9.7 10/20/2018   HGB 15.0 10/20/2018   HCT 44.0 10/20/2018   MCV 91.5 10/20/2018   PLT 273 10/20/2018      Component Value Date/Time   NA 141 10/20/2018 1417   K 4.2 10/20/2018 1417   CL 105 10/20/2018 1417   CO2 25 10/20/2018 1406   GLUCOSE 98 10/20/2018 1417   BUN 11 10/20/2018 1417   CREATININE 1.10 10/20/2018 1417   CALCIUM 9.3 10/20/2018 1406   PROT 7.0 10/20/2018 1406   ALBUMIN 4.4 10/20/2018 1406   AST 20 10/20/2018 1406   ALT 25 10/20/2018 1406   ALKPHOS 79 10/20/2018  1406   BILITOT 1.0 10/20/2018 1406   GFRNONAA >60 10/20/2018 1406   GFRAA >60 10/20/2018 1406   Lab Results  Component Value Date   CHOL 96 03/27/2018   HDL 33 (L) 03/27/2018   LDLCALC 45 03/27/2018  TRIG 92 03/27/2018   CHOLHDL 2.9 03/27/2018   Lab Results  Component Value Date   HGBA1C 6.1 (H) 03/27/2018      ASSESSMENT AND PLAN 73 y.o. year old male  has a past medical history of Acute CVA (cerebrovascular accident) ([place]) (03/25/2018), ADD (attention deficit disorder), Arthritis, Diverticulosis, GERD (gastroesophageal reflux disease), Heart murmur, History of kidney stones, Hyperlipidemia, Hypertension, IFG (impaired fasting glucose), Metabolic syndrome, OSA on CPAP, Pneumonia, PONV (postoperative nausea and vomiting), Shingles (~ 2002), and TIA (transient ischemic attack) (2013). here with :  1. Obstructive sleep apnea on CPAP  The patient's CPAP download shows excellent compliance .  His residual AHI is slightly elevated.  I will increase his pressure to 10 cm of water.  He is encouraged to continue using CPAP nightly and greater than 4 hours each night.  He is advised that if his symptoms worsen or he develops new symptoms he should let us know.  He will follow-up in 1 year or sooner if needed   I spent 15 minutes with the patient. 50% of this time was spent reviewing CPAP download   Ward Givens, MSN, NP-C 10/28/2019, 3:56 PM [facility] [address] [place], [place] [address] [phone]  I reviewed the above note and documentation by the Nurse Practitioner and agree with the history, exam, assessment and plan as outlined above. I was available for consultation. Star Age, MD, PhD [facility])

## 2019-11-04 DIAGNOSIS — I10 Essential (primary) hypertension: Secondary | ICD-10-CM | POA: Diagnosis not present

## 2019-11-04 DIAGNOSIS — B029 Zoster without complications: Secondary | ICD-10-CM | POA: Diagnosis not present

## 2019-11-15 DIAGNOSIS — B029 Zoster without complications: Secondary | ICD-10-CM | POA: Diagnosis not present

## 2019-11-15 DIAGNOSIS — R69 Illness, unspecified: Secondary | ICD-10-CM | POA: Diagnosis not present

## 2019-11-15 DIAGNOSIS — I10 Essential (primary) hypertension: Secondary | ICD-10-CM | POA: Diagnosis not present

## 2020-02-16 DIAGNOSIS — H35372 Puckering of macula, left eye: Secondary | ICD-10-CM | POA: Diagnosis not present

## 2020-02-16 DIAGNOSIS — H43812 Vitreous degeneration, left eye: Secondary | ICD-10-CM | POA: Diagnosis not present

## 2020-02-16 DIAGNOSIS — H43811 Vitreous degeneration, right eye: Secondary | ICD-10-CM | POA: Diagnosis not present

## 2020-02-16 DIAGNOSIS — H43312 Vitreous membranes and strands, left eye: Secondary | ICD-10-CM | POA: Diagnosis not present

## 2020-03-29 ENCOUNTER — Encounter (INDEPENDENT_AMBULATORY_CARE_PROVIDER_SITE_OTHER): Payer: Self-pay | Admitting: Ophthalmology

## 2020-03-29 ENCOUNTER — Ambulatory Visit (INDEPENDENT_AMBULATORY_CARE_PROVIDER_SITE_OTHER): Payer: Medicare HMO | Admitting: Ophthalmology

## 2020-03-29 ENCOUNTER — Other Ambulatory Visit: Payer: Self-pay

## 2020-03-29 DIAGNOSIS — H43312 Vitreous membranes and strands, left eye: Secondary | ICD-10-CM | POA: Diagnosis not present

## 2020-03-29 DIAGNOSIS — H43812 Vitreous degeneration, left eye: Secondary | ICD-10-CM | POA: Diagnosis not present

## 2020-03-29 DIAGNOSIS — H43391 Other vitreous opacities, right eye: Secondary | ICD-10-CM

## 2020-03-29 DIAGNOSIS — Z8673 Personal history of transient ischemic attack (TIA), and cerebral infarction without residual deficits: Secondary | ICD-10-CM | POA: Diagnosis not present

## 2020-03-29 DIAGNOSIS — Z961 Presence of intraocular lens: Secondary | ICD-10-CM

## 2020-03-29 DIAGNOSIS — H35372 Puckering of macula, left eye: Secondary | ICD-10-CM

## 2020-03-29 DIAGNOSIS — H43811 Vitreous degeneration, right eye: Secondary | ICD-10-CM | POA: Diagnosis not present

## 2020-03-29 DIAGNOSIS — H532 Diplopia: Secondary | ICD-10-CM | POA: Diagnosis not present

## 2020-03-29 NOTE — Progress Notes (Signed)
03/29/2020     CHIEF COMPLAINT Patient presents for Retina Follow Up   HISTORY OF PRESENT ILLNESS: Chris [name] is a 74 y.o. male who presents to the clinic today for:   HPI    Retina Follow Up    Patient presents with  PVD.  In left eye.  Duration of 6 weeks.  Since onset it is stable.          Comments    6 week follow up - OCT OU Patient states that his floaters are not better/worse. No other problems or changes in vision.       Last edited by Gerda Diss, [place] Walk on 03/29/2020 11:11 AM. (History)      Referring physician: Marton Redwood, MD [place],  [place] [address]  HISTORICAL INFORMATION:   Selected notes from the MEDICAL RECORD NUMBER    Lab Results  Component Value Date   HGBA1C 6.1 (H) 03/27/2018     CURRENT MEDICATIONS: No current outpatient medications on file. (Ophthalmic Drugs)   No current facility-administered medications for this visit. (Ophthalmic Drugs)   Current Outpatient Medications (Other)  Medication Sig  . atorvastatin (LIPITOR) 40 MG tablet Take 40 mg by mouth daily at 6 PM.   . clopidogrel (PLAVIX) 75 MG tablet Take 1 tablet (75 mg total) by mouth daily. (Patient taking differently: Take 75 mg by mouth at bedtime. )  . fluticasone (FLONASE) 50 MCG/ACT nasal spray Place 1 spray into both nostrils daily as needed for allergies.   Marland Kitchen gabapentin (NEURONTIN) 100 MG capsule TAKE 1 CAPSULE 3X DAILY , CAN INCREASE TO 300 MG 3X DAILY AS NEEDED **MAY CAUSE SEDATION**  . hydrochlorothiazide (HYDRODIURIL) 12.5 MG tablet Take 12.5 mg by mouth every morning.  Marland Kitchen ibuprofen (ADVIL,MOTRIN) 200 MG tablet Take 400 mg by mouth every 6 (six) hours as needed.  . pantoprazole (PROTONIX) 40 MG tablet Take 40 mg by mouth at bedtime.   Marland Kitchen telmisartan (MICARDIS) 80 MG tablet Take 80 mg by mouth at bedtime.    No current facility-administered medications for this visit. (Other)      REVIEW OF SYSTEMS:    ALLERGIES Allergies  Allergen Reactions    . Morphine And Related Nausea And Vomiting    PAST MEDICAL HISTORY Past Medical History:  Diagnosis Date  . Acute CVA (cerebrovascular accident) ([place]) 03/25/2018   "double vision and balance issues" (03/26/2018)  . ADD (attention deficit disorder)   . Arthritis    "thumbs and fingers" (03/26/2018)  . Diverticulosis   . GERD (gastroesophageal reflux disease)   . Heart murmur    "when I was young; not noted since age 57" (03/26/2018)  . History of kidney stones   . Hyperlipidemia   . Hypertension   . IFG (impaired fasting glucose)   . Metabolic syndrome   . OSA on CPAP   . Pneumonia    "a time or 2" (03/26/2018)  . PONV (postoperative nausea and vomiting)   . Shingles ~ 2002  . TIA (transient ischemic attack) 2013   Past Surgical History:  Procedure Laterality Date  . CARPAL TUNNEL RELEASE Right   . CYSTOSCOPY W/ STONE MANIPULATION    . FINGER SURGERY Left    thumb surgery; "took bone out; put tendon in"  . TONSILLECTOMY AND ADENOIDECTOMY      FAMILY HISTORY Family History  Problem Relation Age of Onset  . Prostate cancer Father   . Colon cancer Neg Hx   . Esophageal cancer Neg  Hx   . Liver cancer Neg Hx   . Rectal cancer Neg Hx     SOCIAL HISTORY Social History   Tobacco Use  . Smoking status: Never Smoker  . Smokeless tobacco: Never Used  Substance Use Topics  . Alcohol use: No  . Drug use: Never         OPHTHALMIC EXAM:  Base Eye Exam    Visual Acuity (Snellen - Linear)      Right Left   Dist cc 20/30 -2 20/20   Dist ph cc NI    Correction: Glasses       Tonometry (Tonopen, 11:12 AM)      Right Left   Pressure 10 9       Pupils      Pupils Dark Light Shape React APD   Right PERRL 3 2 Round Brisk None   Left PERRL 3 2 Round Brisk None       Visual Fields (Counting fingers)      Left Right    Full Full       Extraocular Movement      Right Left    Full Full       Neuro/Psych    Oriented x3: Yes   Mood/Affect: Normal        Dilation    Left eye: 1.0% Mydriacyl, 2.5% Phenylephrine @ 11:16 AM        Slit Lamp and Fundus Exam    External Exam      Right Left   External Normal Normal       Slit Lamp Exam      Right Left   Lids/Lashes Normal Normal   Conjunctiva/Sclera White and quiet White and quiet   Cornea Clear Clear   Anterior Chamber Deep and quiet Deep and quiet   Iris Round and reactive Round and reactive   Lens Posterior chamber intraocular lens Posterior chamber intraocular lens   Vitreous Normal Posterior vitreous detachment, Central vitreous floaters       Fundus Exam      Right Left   Disc  Normal   C/D Ratio  0.25   Macula  Normal   Vessels  Normal   Periphery  Normal          IMAGING AND PROCEDURES  Imaging and Procedures for @TODAY @  OCT, Retina - OU - Both Eyes       Right Eye Quality was good. Findings include normal foveal contour.   Left Eye Quality was good. Findings include normal foveal contour.   Notes Posterior vitreous detachment left eye, this is new onset as compared to December 2019.                  ASSESSMENT/PLAN:    ICD-10-CM   1. Posterior vitreous detachment of left eye  H43.812 OCT, Retina - OU - Both Eyes  2. Left epiretinal membrane  H35.372 OCT, Retina - OU - Both Eyes  3. Posterior vitreous detachment of right eye  H43.811   4. Vitreous membranes or strands, left  H43.312   5. Vitreous opacities of right eye  H43.391   6. History of stroke  Z86.73   7. Diplopia  H53.2   8. Pseudophakia  Z96.1     1.  2.  3.  Ophthalmic Meds Ordered this visit:  No orders of the defined types were placed in this encounter.      Return in about 6 months (around 09/28/2020) for DILATE OU, OCT.  Patient Instructions  Macular Pucker  A macular pucker is scarring on the macula of the eye. The macula is the part of the eye that lets you see clearly and sharply. It also lets you see detail. The macula is in the middle of the thin membrane  that covers the back of your eye (retina). A macular pucker usually starts in one eye but can affect both eyes over time. This condition can affect your vision. Vision changes can range from mild to severe. These vision changes usually do not get worse over time. What are the causes? This condition is caused by shrinking of the gel-like fluid that fills your eye (vitreous). The vitreous may shrink as you get older. As it shrinks, it pulls on your macula and forms scar tissue. What increases the risk? You are more likely to develop this condition if you are age 76 or older. Certain medical conditions can also increase your risk of macular pucker. These include:  A disease of the retina (retinopathy).  Injury or trauma to the eye.  Nearsightedness (myopia).  Tearing or detachment of the retina.  Inflammation of the eye (uveitis).  Diabetes. What are the signs or symptoms? Symptoms of this condition include:  Blurred vision. You may have trouble reading small print or seeing fine details.  Distorted vision. Straight lines may appear wavy.  A gray area or blind spot in the center of your vision. A mild pucker may cause no symptoms. How is this diagnosed? This condition is diagnosed based on your medical history and a physical exam. You may be referred to a health care provider who specializes in eye conditions (ophthalmologist) for an eye exam. You may also have tests, including tests that involve:  Checking the back of your eye with a microscope.  Injecting dye into the back of your eye before doing an exam.  Taking pictures of the back of your eye. How is this treated? Treatment is not needed for this condition unless your symptoms interfere with your daily activities. Your eye care provider may change your prescription if you wear eyeglasses to help you see better. If treatment is needed, surgery is the only treatment for macular pucker.  This surgery (vitrectomy) is done only  when macular pucker causes vision changes that interfere with your daily activities.  In this procedure, a surgeon removes any scar tissue and replaces your vitreous with a saltwater solution. Follow these instructions at home:  Be aware of any changes in your vision.  Keep all follow-up visits as told by your health care provider. This is important. Contact a health care provider if:  You have any new or worsening vision changes. Summary  A macular pucker is scarring on the macula of the eye.  This condition is caused by shrinking of the gel-like fluid that fills your eye.  A macular pucker can affect your vision. You may have trouble reading small print or seeing fine details.  Treatment is not needed unless the condition interferes with your daily activities. This information is not intended to replace advice given to you by your health care provider. Make sure you discuss any questions you have with your health care provider. Document Revised: 06/02/2018 Document Reviewed: 06/02/2018 Elsevier Patient Education  2020 [place].   Vitreous Detachment  Vitreous detachment is part of the normal aging process in the eyes. Vitreous is the jelly-like substance that makes up most of the inside of the eyeballs. It helps the eyeballs keep a round  shape. The vitreous is attached to the retina of the eye with a series of fibers. As you age, the vitreous gradually shrinks. Tension increases between the fibers and the retina. Eventually, the fibers can break free from the retina, causing vitreous detachment. In most cases, this does not cause problems and does not require treatment. However, it can sometimes cause the retina to separate from the eyeball (retinal detachment), which requires treatment to prevent vision loss. What are the causes? Aging is the main cause of vitreous detachment. Everyone's vitreous naturally shrinks with age. What increases the risk? You are more likely to have  vitreous detachment if you:  Are at least 76 years old.  Have inflammation of the eye.  Have an eye injury.  Have had eye surgery.  Have a hemorrhage in your eye.  Are very nearsighted (myopia).  Have diabetes. What are the signs or symptoms? Most people with this condition will not notice any symptoms. If symptoms do occur, the most common are floaters. Floaters occur as the vitreous begins to shrink. They may:  Appear as tiny dots or webs in your vision.  Seem to disappear when you look at them directly.  Appear more often as your condition gets worse. Other symptoms include:  Flashes of light (photopsia) in your peripheral vision that may look like lightning streaks.  Decreased vision or a dark curtain or shadow moving across your field of vision. This is rare. How is this diagnosed? This condition may be diagnosed based on:  Your signs and symptoms.  An exam by a health care provider who specializes in conditions and diseases of the eye (ophthalmologist). The exam may include: ? Putting eye drops in your eye to make the pupil wider (dilated). The pupil is the opening in the center of the eye. ? Checking the pupils with a magnifying glass. This exam is the best way to determine the type and extent of damage to your eye. How is this treated? For most people, a vitreous detachment is harmless, causing no symptoms or vision loss, and does not require treatment. Floaters usually become less noticeable over time. If the condition causes retinal detachment, you may need eye surgery to reattach your retina (reattachment surgery) in order to prevent vision loss or restore your vision. Follow these instructions at home:  Keep all follow-up visits as told by your health care provider. This is important. Get help right away if:  You develop signs of retinal detachment. These include: ? A sudden increase in the number of floaters you see. ? An increase in the number of flashes of  light you see in your peripheral vision. ? Decreased vision. Summary  Vitreous detachment is part of the normal aging process in the eyes.  Vitreous is the jelly-like substance inside the eyeballs. As you age, the vitreous shrinks, and the fibers that attach the vitreous to the retina can break free, causing vitreous detachment.  In most cases, vitreous detachment does not cause symptoms and does not require treatment. The most common symptom that can occur is seeing floaters that appear as tiny dots or webs in your vision.  Vitreous detachment can sometimes cause the retina to separate from the eyeball (retinal detachment). This must be treated to prevent vision loss. This information is not intended to replace advice given to you by your health care provider. Make sure you discuss any questions you have with your health care provider. Document Revised: 11/21/2017 Document Reviewed: 10/30/2017 Elsevier Patient Education  [phone]  [place] the diagnoses, plan, and follow up with the patient and they expressed understanding.  Patient expressed understanding of the importance of proper follow up care.   Clent Demark Lynsee Wands M.D. Diseases & Surgery of the Retina and Vitreous [facility]ina [facility] [place] @TODAY @     Abbreviations: M myopia (nearsighted); A astigmatism; H hyperopia (farsighted); P presbyopia; Mrx spectacle prescription;  CTL contact lenses; OD right eye; OS left eye; OU both eyes  XT exotropia; ET esotropia; PEK punctate epithelial keratitis; PEE punctate epithelial erosions; DES dry eye syndrome; MGD meibomian gland dysfunction; ATs artificial tears; PFAT's preservative free artificial tears; [place] nuclear sclerotic cataract; PSC posterior subcapsular cataract; ERM epi-retinal membrane; PVD posterior vitreous detachment; RD retinal detachment; DM diabetes mellitus; DR diabetic retinopathy; NPDR non-proliferative diabetic retinopathy; PDR proliferative diabetic  retinopathy; CSME clinically significant macular edema; DME diabetic macular edema; dbh dot blot hemorrhages; CWS cotton wool spot; POAG primary open angle glaucoma; C/D cup-to-disc ratio; HVF humphrey visual field; GVF goldmann visual field; OCT optical coherence tomography; IOP intraocular pressure; BRVO Branch retinal vein occlusion; CRVO central retinal vein occlusion; CRAO central retinal artery occlusion; BRAO branch retinal artery occlusion; RT retinal tear; SB scleral buckle; PPV pars plana vitrectomy; VH Vitreous hemorrhage; PRP panretinal laser photocoagulation; IVK intravitreal kenalog; VMT vitreomacular traction; MH Macular hole;  NVD neovascularization of the disc; NVE neovascularization elsewhere; AREDS age related eye disease study; ARMD age related macular degeneration; POAG primary open angle glaucoma; EBMD epithelial/anterior basement membrane dystrophy; ACIOL anterior chamber intraocular lens; IOL intraocular lens; PCIOL posterior chamber intraocular lens; Phaco/IOL phacoemulsification with intraocular lens placement; [place] photorefractive keratectomy; LASIK laser assisted in situ keratomileusis; HTN hypertension; DM diabetes mellitus; COPD chronic obstructive pulmonary disease

## 2020-03-29 NOTE — Patient Instructions (Signed)
Macular Pucker  A macular pucker is scarring on the macula of the eye. The macula is the part of the eye that lets you see clearly and sharply. It also lets you see detail. The macula is in the middle of the thin membrane that covers the back of your eye (retina). A macular pucker usually starts in one eye but can affect both eyes over time. This condition can affect your vision. Vision changes can range from mild to severe. These vision changes usually do not get worse over time. What are the causes? This condition is caused by shrinking of the gel-like fluid that fills your eye (vitreous). The vitreous may shrink as you get older. As it shrinks, it pulls on your macula and forms scar tissue. What increases the risk? You are more likely to develop this condition if you are age 76 or older. Certain medical conditions can also increase your risk of macular pucker. These include:  A disease of the retina (retinopathy).  Injury or trauma to the eye.  Nearsightedness (myopia).  Tearing or detachment of the retina.  Inflammation of the eye (uveitis).  Diabetes. What are the signs or symptoms? Symptoms of this condition include:  Blurred vision. You may have trouble reading small print or seeing fine details.  Distorted vision. Straight lines may appear wavy.  A gray area or blind spot in the center of your vision. A mild pucker may cause no symptoms. How is this diagnosed? This condition is diagnosed based on your medical history and a physical exam. You may be referred to a health care provider who specializes in eye conditions (ophthalmologist) for an eye exam. You may also have tests, including tests that involve:  Checking the back of your eye with a microscope.  Injecting dye into the back of your eye before doing an exam.  Taking pictures of the back of your eye. How is this treated? Treatment is not needed for this condition unless your symptoms interfere with your daily  activities. Your eye care provider may change your prescription if you wear eyeglasses to help you see better. If treatment is needed, surgery is the only treatment for macular pucker.  This surgery (vitrectomy) is done only when macular pucker causes vision changes that interfere with your daily activities.  In this procedure, a surgeon removes any scar tissue and replaces your vitreous with a saltwater solution. Follow these instructions at home:  Be aware of any changes in your vision.  Keep all follow-up visits as told by your health care provider. This is important. Contact a health care provider if:  You have any new or worsening vision changes. Summary  A macular pucker is scarring on the macula of the eye.  This condition is caused by shrinking of the gel-like fluid that fills your eye.  A macular pucker can affect your vision. You may have trouble reading small print or seeing fine details.  Treatment is not needed unless the condition interferes with your daily activities. This information is not intended to replace advice given to you by your health care provider. Make sure you discuss any questions you have with your health care provider. Document Revised: 06/02/2018 Document Reviewed: 06/02/2018 Elsevier Patient Education  2020 [place].   Vitreous Detachment  Vitreous detachment is part of the normal aging process in the eyes. Vitreous is the jelly-like substance that makes up most of the inside of the eyeballs. It helps the eyeballs keep a round shape. The vitreous is  attached to the retina of the eye with a series of fibers. As you age, the vitreous gradually shrinks. Tension increases between the fibers and the retina. Eventually, the fibers can break free from the retina, causing vitreous detachment. In most cases, this does not cause problems and does not require treatment. However, it can sometimes cause the retina to separate from the eyeball (retinal  detachment), which requires treatment to prevent vision loss. What are the causes? Aging is the main cause of vitreous detachment. Everyone's vitreous naturally shrinks with age. What increases the risk? You are more likely to have vitreous detachment if you:  Are at least 76 years old.  Have inflammation of the eye.  Have an eye injury.  Have had eye surgery.  Have a hemorrhage in your eye.  Are very nearsighted (myopia).  Have diabetes. What are the signs or symptoms? Most people with this condition will not notice any symptoms. If symptoms do occur, the most common are floaters. Floaters occur as the vitreous begins to shrink. They may:  Appear as tiny dots or webs in your vision.  Seem to disappear when you look at them directly.  Appear more often as your condition gets worse. Other symptoms include:  Flashes of light (photopsia) in your peripheral vision that may look like lightning streaks.  Decreased vision or a dark curtain or shadow moving across your field of vision. This is rare. How is this diagnosed? This condition may be diagnosed based on:  Your signs and symptoms.  An exam by a health care provider who specializes in conditions and diseases of the eye (ophthalmologist). The exam may include: ? Putting eye drops in your eye to make the pupil wider (dilated). The pupil is the opening in the center of the eye. ? Checking the pupils with a magnifying glass. This exam is the best way to determine the type and extent of damage to your eye. How is this treated? For most people, a vitreous detachment is harmless, causing no symptoms or vision loss, and does not require treatment. Floaters usually become less noticeable over time. If the condition causes retinal detachment, you may need eye surgery to reattach your retina (reattachment surgery) in order to prevent vision loss or restore your vision. Follow these instructions at home:  Keep all follow-up visits as  told by your health care provider. This is important. Get help right away if:  You develop signs of retinal detachment. These include: ? A sudden increase in the number of floaters you see. ? An increase in the number of flashes of light you see in your peripheral vision. ? Decreased vision. Summary  Vitreous detachment is part of the normal aging process in the eyes.  Vitreous is the jelly-like substance inside the eyeballs. As you age, the vitreous shrinks, and the fibers that attach the vitreous to the retina can break free, causing vitreous detachment.  In most cases, vitreous detachment does not cause symptoms and does not require treatment. The most common symptom that can occur is seeing floaters that appear as tiny dots or webs in your vision.  Vitreous detachment can sometimes cause the retina to separate from the eyeball (retinal detachment). This must be treated to prevent vision loss. This information is not intended to replace advice given to you by your health care provider. Make sure you discuss any questions you have with your health care provider. Document Revised: 11/21/2017 Document Reviewed: 10/30/2017 Elsevier Patient Education  2020 [employer].

## 2020-05-10 DIAGNOSIS — G4733 Obstructive sleep apnea (adult) (pediatric): Secondary | ICD-10-CM | POA: Diagnosis not present

## 2020-05-30 DIAGNOSIS — R69 Illness, unspecified: Secondary | ICD-10-CM | POA: Diagnosis not present

## 2020-06-06 DIAGNOSIS — Z125 Encounter for screening for malignant neoplasm of prostate: Secondary | ICD-10-CM | POA: Diagnosis not present

## 2020-06-06 DIAGNOSIS — Z0000 Encounter for general adult medical examination without abnormal findings: Secondary | ICD-10-CM | POA: Diagnosis not present

## 2020-06-06 DIAGNOSIS — R7301 Impaired fasting glucose: Secondary | ICD-10-CM | POA: Diagnosis not present

## 2020-06-06 DIAGNOSIS — E7849 Other hyperlipidemia: Secondary | ICD-10-CM | POA: Diagnosis not present

## 2020-06-10 DIAGNOSIS — G4733 Obstructive sleep apnea (adult) (pediatric): Secondary | ICD-10-CM | POA: Diagnosis not present

## 2020-06-13 DIAGNOSIS — Z1331 Encounter for screening for depression: Secondary | ICD-10-CM | POA: Diagnosis not present

## 2020-06-13 DIAGNOSIS — E785 Hyperlipidemia, unspecified: Secondary | ICD-10-CM | POA: Diagnosis not present

## 2020-06-13 DIAGNOSIS — R7301 Impaired fasting glucose: Secondary | ICD-10-CM | POA: Diagnosis not present

## 2020-06-13 DIAGNOSIS — I10 Essential (primary) hypertension: Secondary | ICD-10-CM | POA: Diagnosis not present

## 2020-06-13 DIAGNOSIS — K219 Gastro-esophageal reflux disease without esophagitis: Secondary | ICD-10-CM | POA: Diagnosis not present

## 2020-06-13 DIAGNOSIS — Z0000 Encounter for general adult medical examination without abnormal findings: Secondary | ICD-10-CM | POA: Diagnosis not present

## 2020-06-13 DIAGNOSIS — R82998 Other abnormal findings in urine: Secondary | ICD-10-CM | POA: Diagnosis not present

## 2020-06-13 DIAGNOSIS — E8881 Metabolic syndrome: Secondary | ICD-10-CM | POA: Diagnosis not present

## 2020-06-13 DIAGNOSIS — Z1339 Encounter for screening examination for other mental health and behavioral disorders: Secondary | ICD-10-CM | POA: Diagnosis not present

## 2020-06-13 DIAGNOSIS — G4733 Obstructive sleep apnea (adult) (pediatric): Secondary | ICD-10-CM | POA: Diagnosis not present

## 2020-06-13 DIAGNOSIS — E781 Pure hyperglyceridemia: Secondary | ICD-10-CM | POA: Diagnosis not present

## 2020-06-13 DIAGNOSIS — Z8673 Personal history of transient ischemic attack (TIA), and cerebral infarction without residual deficits: Secondary | ICD-10-CM | POA: Diagnosis not present

## 2020-06-13 DIAGNOSIS — R1310 Dysphagia, unspecified: Secondary | ICD-10-CM | POA: Diagnosis not present

## 2020-07-10 DIAGNOSIS — G4733 Obstructive sleep apnea (adult) (pediatric): Secondary | ICD-10-CM | POA: Diagnosis not present

## 2020-08-16 ENCOUNTER — Encounter: Payer: Self-pay | Admitting: Adult Health

## 2020-09-19 DIAGNOSIS — E785 Hyperlipidemia, unspecified: Secondary | ICD-10-CM | POA: Diagnosis not present

## 2020-09-26 DIAGNOSIS — H52203 Unspecified astigmatism, bilateral: Secondary | ICD-10-CM | POA: Diagnosis not present

## 2020-09-26 DIAGNOSIS — Z961 Presence of intraocular lens: Secondary | ICD-10-CM | POA: Diagnosis not present

## 2020-09-27 DIAGNOSIS — Z23 Encounter for immunization: Secondary | ICD-10-CM | POA: Diagnosis not present

## 2020-09-28 ENCOUNTER — Encounter (INDEPENDENT_AMBULATORY_CARE_PROVIDER_SITE_OTHER): Payer: Self-pay | Admitting: Ophthalmology

## 2020-09-28 ENCOUNTER — Other Ambulatory Visit: Payer: Self-pay

## 2020-09-28 ENCOUNTER — Ambulatory Visit (INDEPENDENT_AMBULATORY_CARE_PROVIDER_SITE_OTHER): Payer: Medicare HMO | Admitting: Ophthalmology

## 2020-09-28 DIAGNOSIS — H35372 Puckering of macula, left eye: Secondary | ICD-10-CM | POA: Diagnosis not present

## 2020-09-28 DIAGNOSIS — H43811 Vitreous degeneration, right eye: Secondary | ICD-10-CM | POA: Diagnosis not present

## 2020-09-28 DIAGNOSIS — H43812 Vitreous degeneration, left eye: Secondary | ICD-10-CM | POA: Diagnosis not present

## 2020-09-28 NOTE — Assessment & Plan Note (Signed)

## 2020-09-28 NOTE — Patient Instructions (Signed)
Patient instructed and monocular monitoring of near vision OU to look for new onset changes particularly the left eye

## 2020-09-28 NOTE — Progress Notes (Signed)
09/28/2020     CHIEF COMPLAINT Patient presents for Retina Follow Up   HISTORY OF PRESENT ILLNESS: Chris [name] is a 74 y.o. male who presents to the clinic today for:   HPI    Retina Follow Up    Patient presents with  Other.  In left eye.  This started 6 months ago.  Severity is mild.  Duration of 6 months.  Since onset it is stable.          Comments    6 Month F/U OU  Pt reports stable floaters OU that are constant and move around. Pt denies new symptoms OU. Stable VA OU. Pt denies flashes of light OU.       Last edited by Rockie Neighbours, [place] on 09/28/2020 10:36 AM. (History)      Referring physician: Marton Redwood, MD [place],  [place] [address]  HISTORICAL INFORMATION:   Selected notes from the [facility]    Lab Results  Component Value Date   HGBA1C 6.1 (H) 03/27/2018     CURRENT MEDICATIONS: No current outpatient medications on file. (Ophthalmic Drugs)   No current facility-administered medications for this visit. (Ophthalmic Drugs)   Current Outpatient Medications (Other)  Medication Sig  . atorvastatin (LIPITOR) 40 MG tablet Take 40 mg by mouth daily at 6 PM.   . clopidogrel (PLAVIX) 75 MG tablet Take 1 tablet (75 mg total) by mouth daily. (Patient taking differently: Take 75 mg by mouth at bedtime. )  . fluticasone (FLONASE) 50 MCG/ACT nasal spray Place 1 spray into both nostrils daily as needed for allergies.   Marland Kitchen gabapentin (NEURONTIN) 100 MG capsule TAKE 1 CAPSULE 3X DAILY , CAN INCREASE TO 300 MG 3X DAILY AS NEEDED **MAY CAUSE SEDATION**  . hydrochlorothiazide (HYDRODIURIL) 12.5 MG tablet Take 12.5 mg by mouth every morning.  Marland Kitchen ibuprofen (ADVIL,MOTRIN) 200 MG tablet Take 400 mg by mouth every 6 (six) hours as needed.  . pantoprazole (PROTONIX) 40 MG tablet Take 40 mg by mouth at bedtime.   Marland Kitchen telmisartan (MICARDIS) 80 MG tablet Take 80 mg by mouth at bedtime.    No current facility-administered medications for this visit.  (Other)      REVIEW OF SYSTEMS:    ALLERGIES Allergies  Allergen Reactions  . Morphine And Related Nausea And Vomiting    PAST MEDICAL HISTORY Past Medical History:  Diagnosis Date  . Acute CVA (cerebrovascular accident) ([place]) 03/25/2018   "double vision and balance issues" (03/26/2018)  . ADD (attention deficit disorder)   . Arthritis    "thumbs and fingers" (03/26/2018)  . Diverticulosis   . GERD (gastroesophageal reflux disease)   . Heart murmur    "when I was young; not noted since age 34" (03/26/2018)  . History of kidney stones   . Hyperlipidemia   . Hypertension   . IFG (impaired fasting glucose)   . Metabolic syndrome   . OSA on CPAP   . Pneumonia    "a time or 2" (03/26/2018)  . PONV (postoperative nausea and vomiting)   . Shingles ~ 2002  . TIA (transient ischemic attack) 2013   Past Surgical History:  Procedure Laterality Date  . CARPAL TUNNEL RELEASE Right   . CYSTOSCOPY W/ STONE MANIPULATION    . FINGER SURGERY Left    thumb surgery; "took bone out; put tendon in"  . TONSILLECTOMY AND ADENOIDECTOMY      FAMILY HISTORY Family History  Problem Relation Age of Onset  .  Prostate cancer Father   . Colon cancer Neg Hx   . Esophageal cancer Neg Hx   . Liver cancer Neg Hx   . Rectal cancer Neg Hx     SOCIAL HISTORY Social History   Tobacco Use  . Smoking status: Never Smoker  . Smokeless tobacco: Never Used  Vaping Use  . Vaping Use: Never used  Substance Use Topics  . Alcohol use: No  . Drug use: Never         OPHTHALMIC EXAM:  Base Eye Exam    Visual Acuity (ETDRS)      Right Left   Dist cc 20/30 +1 20/20 -1   Dist ph cc 20/25 +1    Correction: Glasses       Tonometry (Tonopen, 10:33 AM)      Right Left   Pressure 15 10       Pupils      Pupils Dark Light Shape React APD   Right PERRL 3 2 Round Brisk None   Left PERRL 3 2 Round Brisk None       Visual Fields (Counting fingers)      Left Right    Full Full        Extraocular Movement      Right Left    Full Full       Neuro/Psych    Oriented x3: Yes   Mood/Affect: Normal       Dilation    Both eyes: 1.0% Mydriacyl, 2.5% Phenylephrine @ 10:38 AM        Slit Lamp and Fundus Exam    External Exam      Right Left   External Normal Normal       Slit Lamp Exam      Right Left   Lids/Lashes Normal Normal   Conjunctiva/Sclera White and quiet White and quiet   Cornea Clear Clear   Anterior Chamber Deep and quiet Deep and quiet   Iris Round and reactive Round and reactive   Lens Posterior chamber intraocular lens, 1+ Posterior capsular opacification Posterior chamber intraocular lens, 1+ Posterior capsular opacification   Anterior Vitreous Normal Posterior vitreous detachment, Central vitreous floaters       Fundus Exam      Right Left   Posterior Vitreous Posterior vitreous detachment Posterior vitreous detachment, Central vitreous floaters   Disc Normal Normal   C/D Ratio 0.2 0.25   Macula Normal Normal, minor ERM, NO TOPO DISTORTION   Vessels Normal Normal   Periphery Normal Normal          IMAGING AND PROCEDURES  Imaging and Procedures for 09/28/20  OCT, Retina - OU - Both Eyes       Right Eye Quality was good. Scan locations included subfoveal. Central Foveal Thickness: 252. Progression has been stable. Findings include normal foveal contour.   Left Eye Quality was good. Scan locations included subfoveal. Central Foveal Thickness: 273. Progression has been stable. Findings include normal foveal contour, epiretinal membrane.   Notes Normal foveal contour OD, and OS.  Minor epiretinal membrane superonasal to the foveal region left eye, no change no progression hence will observe                ASSESSMENT/PLAN:  Left epiretinal membrane Minor epiretinal membrane superonasal to the foveal region left eye, no change no progression hence will observe  Posterior vitreous detachment of left eye   The nature of  posterior vitreous detachment was discussed with the patient as  well as its physiology, its age prevalence, and its possible implication regarding retinal breaks and detachment.  An informational brochure was given to the patient.  All the patient's questions were answered.  The patient was asked to return if new or different flashes or floaters develops.   Patient was instructed to contact office immediately if any changes were noticed. I explained to the patient that vitreous inside the eye is similar to jello inside a bowl. As the jello melts it can start to pull away from the bowl, similarly the vitreous throughout our lives can begin to pull away from the retina. That process is called a posterior vitreous detachment. In some cases, the vitreous can tug hard enough on the retina to form a retinal tear. I discussed with the patient the signs and symptoms of a retinal detachment.  Do not rub the eye.  Posterior vitreous detachment of right eye   The nature of posterior vitreous detachment was discussed with the patient as well as its physiology, its age prevalence, and its possible implication regarding retinal breaks and detachment.  An informational brochure was given to the patient.  All the patient's questions were answered.  The patient was asked to return if new or different flashes or floaters develops.   Patient was instructed to contact office immediately if any changes were noticed. I explained to the patient that vitreous inside the eye is similar to jello inside a bowl. As the jello melts it can start to pull away from the bowl, similarly the vitreous throughout our lives can begin to pull away from the retina. That process is called a posterior vitreous detachment. In some cases, the vitreous can tug hard enough on the retina to form a retinal tear. I discussed with the patient the signs and symptoms of a retinal detachment.  Do not rub the eye.      ICD-10-CM   1. Posterior vitreous  detachment of left eye  H43.812 OCT, Retina - OU - Both Eyes  2. Left epiretinal membrane  H35.372 OCT, Retina - OU - Both Eyes  3. Posterior vitreous detachment of right eye  H43.811     1.  No interval change in the epiretinal membrane left eye.  We will continue to observe.  2.  Follow up with Dr. Luberta Mutter is more than adequate with periodic OCT of the macula and monitoring of the foveal acuity  3.  Patient may return visit here as needed-  Ophthalmic Meds Ordered this visit:  No orders of the defined types were placed in this encounter.      Return for Follow-up with Dr. Ellie Lunch as scheduled.  Patient Instructions  Patient instructed and monocular monitoring of near vision OU to look for new onset changes particularly the left eye    Explained the diagnoses, plan, and follow up with the patient and they expressed understanding.  Patient expressed understanding of the importance of proper follow up care.   Clent Demark Domingo Fuson M.D. Diseases & Surgery of the Retina and Vitreous [facility] 09/28/20     Abbreviations: M myopia (nearsighted); A astigmatism; H hyperopia (farsighted); P presbyopia; Mrx spectacle prescription;  CTL contact lenses; OD right eye; OS left eye; OU both eyes  XT exotropia; ET esotropia; PEK punctate epithelial keratitis; PEE punctate epithelial erosions; DES dry eye syndrome; MGD meibomian gland dysfunction; ATs artificial tears; PFAT's preservative free artificial tears; [place] nuclear sclerotic cataract; PSC posterior subcapsular cataract; ERM epi-retinal membrane; PVD posterior  vitreous detachment; RD retinal detachment; DM diabetes mellitus; DR diabetic retinopathy; NPDR non-proliferative diabetic retinopathy; PDR proliferative diabetic retinopathy; CSME clinically significant macular edema; DME diabetic macular edema; dbh dot blot hemorrhages; CWS cotton wool spot; POAG primary open angle glaucoma; C/D cup-to-disc ratio; HVF humphrey  visual field; GVF goldmann visual field; OCT optical coherence tomography; IOP intraocular pressure; BRVO Branch retinal vein occlusion; CRVO central retinal vein occlusion; CRAO central retinal artery occlusion; BRAO branch retinal artery occlusion; RT retinal tear; SB scleral buckle; PPV pars plana vitrectomy; VH Vitreous hemorrhage; PRP panretinal laser photocoagulation; IVK intravitreal kenalog; VMT vitreomacular traction; MH Macular hole;  NVD neovascularization of the disc; NVE neovascularization elsewhere; AREDS age related eye disease study; ARMD age related macular degeneration; POAG primary open angle glaucoma; EBMD epithelial/anterior basement membrane dystrophy; ACIOL anterior chamber intraocular lens; IOL intraocular lens; PCIOL posterior chamber intraocular lens; Phaco/IOL phacoemulsification with intraocular lens placement; [place] photorefractive keratectomy; LASIK laser assisted in situ keratomileusis; HTN hypertension; DM diabetes mellitus; COPD chronic obstructive pulmonary disease

## 2020-09-28 NOTE — Assessment & Plan Note (Signed)
Minor epiretinal membrane superonasal to the foveal region left eye, no change no progression hence will observe

## 2020-10-06 DIAGNOSIS — Z0100 Encounter for examination of eyes and vision without abnormal findings: Secondary | ICD-10-CM | POA: Diagnosis not present

## 2020-10-30 NOTE — Progress Notes (Signed)
PATIENT: Chris [name] DOB: [birth year]  REASON FOR VISIT: follow up HISTORY FROM: patient  HISTORY OF PRESENT ILLNESS: Today 10/30/20:  Chris [name] is a 74 year old male with a history of obstructive sleep apnea on CPAP.  His download is from July 15 August 13.  He uses machine nightly for compliance of 100%.  He uses machine greater than 4 hours each night.  On average he uses his machine 8 hours and 29 minutes.  His residual AHI is 4.5 on 9 cm of water.  He states that since the recall he has been back to using his old machine.  He notes that when he was using his new machine he developed a deep cough.  When he switched to his old machine the cough seemed to improve.  He also noticed black particles floating in his new machine.  He returns today for follow-up.  HISTORY 10/28/19:  Chris [name] is a 74 year old male with a history of obstructive sleep apnea on CPAP.  His download indicates that he use his machine nightly for compliance of 100%.  He uses machine greater than 4 hours each night.  On average he uses his machine 9 hours and 30 minutes.  His residual AHI is 6.7 on 9 cm of water.  He states that the CPAP continues to work well for him.  He states on occasion he will feel his mask leaking but nothing significant.  He returns today for an evaluation.  REVIEW OF SYSTEMS: Out of a complete 14 system review of symptoms, the patient complains only of the following symptoms, and all other reviewed systems are negative.   ESS 2  ALLERGIES: Allergies  Allergen Reactions  . Morphine And Related Nausea And Vomiting    HOME MEDICATIONS: Outpatient Medications Prior to Visit  Medication Sig Dispense Refill  . atorvastatin (LIPITOR) 40 MG tablet Take 40 mg by mouth daily at 6 PM.     . clopidogrel (PLAVIX) 75 MG tablet Take 1 tablet (75 mg total) by mouth daily. (Patient taking differently: Take 75 mg by mouth at bedtime. ) 30 tablet 0  . fluticasone (FLONASE) 50 MCG/ACT nasal spray Place  1 spray into both nostrils daily as needed for allergies.     Marland Kitchen gabapentin (NEURONTIN) 100 MG capsule TAKE 1 CAPSULE 3X DAILY , CAN INCREASE TO 300 MG 3X DAILY AS NEEDED **MAY CAUSE SEDATION**    . hydrochlorothiazide (HYDRODIURIL) 12.5 MG tablet Take 12.5 mg by mouth every morning.    Marland Kitchen ibuprofen (ADVIL,MOTRIN) 200 MG tablet Take 400 mg by mouth every 6 (six) hours as needed.    . pantoprazole (PROTONIX) 40 MG tablet Take 40 mg by mouth at bedtime.     Marland Kitchen telmisartan (MICARDIS) 80 MG tablet Take 80 mg by mouth at bedtime.      No facility-administered medications prior to visit.    PAST MEDICAL HISTORY: Past Medical History:  Diagnosis Date  . Acute CVA (cerebrovascular accident) ([place]) 03/25/2018   "double vision and balance issues" (03/26/2018)  . ADD (attention deficit disorder)   . Arthritis    "thumbs and fingers" (03/26/2018)  . Diverticulosis   . GERD (gastroesophageal reflux disease)   . Heart murmur    "when I was young; not noted since age 89" (03/26/2018)  . History of kidney stones   . Hyperlipidemia   . Hypertension   . IFG (impaired fasting glucose)   . Metabolic syndrome   . OSA on CPAP   . Pneumonia    "  a time or 2" (03/26/2018)  . PONV (postoperative nausea and vomiting)   . Shingles ~ 2002  . TIA (transient ischemic attack) 2013    PAST SURGICAL HISTORY: Past Surgical History:  Procedure Laterality Date  . CARPAL TUNNEL RELEASE Right   . CYSTOSCOPY W/ STONE MANIPULATION    . FINGER SURGERY Left    thumb surgery; "took bone out; put tendon in"  . TONSILLECTOMY AND ADENOIDECTOMY      FAMILY HISTORY: Family History  Problem Relation Age of Onset  . Prostate cancer Father   . Colon cancer Neg Hx   . Esophageal cancer Neg Hx   . Liver cancer Neg Hx   . Rectal cancer Neg Hx     SOCIAL HISTORY: Social History   Socioeconomic History  . Marital status: Married    Spouse name: Not on file  . Number of children: Not on file  . Years of education: Not on  file  . Highest education level: Not on file  Occupational History  . Not on file  Tobacco Use  . Smoking status: Never Smoker  . Smokeless tobacco: Never Used  Vaping Use  . Vaping Use: Never used  Substance and Sexual Activity  . Alcohol use: No  . Drug use: Never  . Sexual activity: Not on file  Other Topics Concern  . Not on file  Social History Narrative  . Not on file   Social Determinants of Health   Financial Resource Strain:   . Difficulty of Paying Living Expenses: Not on file  Food Insecurity:   . Worried About Charity fundraiser in the Last Year: Not on file  . Ran Out of Food in the Last Year: Not on file  Transportation Needs:   . Lack of Transportation (Medical): Not on file  . Lack of Transportation (Non-Medical): Not on file  Physical Activity:   . Days of Exercise per Week: Not on file  . Minutes of Exercise per Session: Not on file  Stress:   . Feeling of Stress : Not on file  Social Connections:   . Frequency of Communication with Friends and Family: Not on file  . Frequency of Social Gatherings with Friends and Family: Not on file  . Attends Religious Services: Not on file  . Active Member of Clubs or Organizations: Not on file  . Attends Archivist Meetings: Not on file  . Marital Status: Not on file  Intimate Partner Violence:   . Fear of Current or Ex-Partner: Not on file  . Emotionally Abused: Not on file  . Physically Abused: Not on file  . Sexually Abused: Not on file      PHYSICAL EXAM  Vitals:   10/31/20 1102  BP: 118/80  Weight: 208 lb 3.2 oz (94.4 kg)  Height: 5' 10.5" (1.791 m)   Body mass index is 29.45 kg/m.  Generalized: Well developed, in no acute distress  Chest: Lungs clear to auscultation bilaterally  Neurological examination  Mentation: Alert oriented to time, place, history taking. Follows all commands speech and language fluent Cranial nerve II-XII: Extraocular movements were full, visual field were  full on confrontational test Head turning and shoulder shrug  were normal and symmetric. Motor: The motor testing reveals 5 over 5 strength of all 4 extremities. Good symmetric motor tone is noted throughout.  Sensory: Sensory testing is intact to soft touch on all 4 extremities. No evidence of extinction is noted.  Gait and station: Gait is normal.  DIAGNOSTIC DATA (LABS, IMAGING, TESTING) - I reviewed patient records, labs, notes, testing and imaging myself where available.  Lab Results  Component Value Date   WBC 9.7 10/20/2018   HGB 15.0 10/20/2018   HCT 44.0 10/20/2018   MCV 91.5 10/20/2018   PLT 273 10/20/2018      Component Value Date/Time   NA 141 10/20/2018 1417   K 4.2 10/20/2018 1417   CL 105 10/20/2018 1417   CO2 25 10/20/2018 1406   GLUCOSE 98 10/20/2018 1417   BUN 11 10/20/2018 1417   CREATININE 1.10 10/20/2018 1417   CALCIUM 9.3 10/20/2018 1406   PROT 7.0 10/20/2018 1406   ALBUMIN 4.4 10/20/2018 1406   AST 20 10/20/2018 1406   ALT 25 10/20/2018 1406   ALKPHOS 79 10/20/2018 1406   BILITOT 1.0 10/20/2018 1406   GFRNONAA >60 10/20/2018 1406   GFRAA >60 10/20/2018 1406   Lab Results  Component Value Date   CHOL 96 03/27/2018   HDL 33 (L) 03/27/2018   LDLCALC 45 03/27/2018   TRIG 92 03/27/2018   CHOLHDL 2.9 03/27/2018   Lab Results  Component Value Date   HGBA1C 6.1 (H) 03/27/2018   No results found for: VITAMINB12 No results found for: TSH    ASSESSMENT AND PLAN 74 y.o. year old male  has a past medical history of Acute CVA (cerebrovascular accident) ([place]) (03/25/2018), ADD (attention deficit disorder), Arthritis, Diverticulosis, GERD (gastroesophageal reflux disease), Heart murmur, History of kidney stones, Hyperlipidemia, Hypertension, IFG (impaired fasting glucose), Metabolic syndrome, OSA on CPAP, Pneumonia, PONV (postoperative nausea and vomiting), Shingles (~ 2002), and TIA (transient ischemic attack) (2013). here with:  1. OSA on CPAP  -  CPAP compliance excellent - Good treatment of AHI  - Encourage patient to use CPAP nightly and > 4 hours each night -I did provide him with an order to give to his DME company to change his old machine to 9 cm of water the patient states that it was originally set at 8. - F/U in 1 year or sooner if needed   I spent 25 minutes of face-to-face and non-face-to-face time with patient.  This included previsit chart review, lab review, study review, order entry, electronic health record documentation, patient education.  Ward Givens, MSN, NP-C 10/30/2020, 3:37 PM [facility] [address], [place], [place] [address] [phone]

## 2020-10-31 ENCOUNTER — Ambulatory Visit: Payer: Medicare HMO | Admitting: Adult Health

## 2020-10-31 ENCOUNTER — Encounter: Payer: Self-pay | Admitting: Adult Health

## 2020-10-31 VITALS — BP 118/80 | Ht 70.5 in | Wt 208.2 lb

## 2020-10-31 DIAGNOSIS — G4733 Obstructive sleep apnea (adult) (pediatric): Secondary | ICD-10-CM

## 2020-10-31 DIAGNOSIS — Z9989 Dependence on other enabling machines and devices: Secondary | ICD-10-CM | POA: Diagnosis not present

## 2020-10-31 NOTE — Patient Instructions (Signed)
Continue using CPAP nightly and greater than 4 hours each night °If your symptoms worsen or you develop new symptoms please let us know.  ° °

## 2021-01-15 DIAGNOSIS — G4733 Obstructive sleep apnea (adult) (pediatric): Secondary | ICD-10-CM | POA: Diagnosis not present

## 2021-03-09 DIAGNOSIS — Z974 Presence of external hearing-aid: Secondary | ICD-10-CM | POA: Diagnosis not present

## 2021-03-09 DIAGNOSIS — J343 Hypertrophy of nasal turbinates: Secondary | ICD-10-CM | POA: Diagnosis not present

## 2021-03-09 DIAGNOSIS — H903 Sensorineural hearing loss, bilateral: Secondary | ICD-10-CM | POA: Diagnosis not present

## 2021-03-09 DIAGNOSIS — H6123 Impacted cerumen, bilateral: Secondary | ICD-10-CM | POA: Diagnosis not present

## 2021-04-05 DIAGNOSIS — G4733 Obstructive sleep apnea (adult) (pediatric): Secondary | ICD-10-CM | POA: Diagnosis not present

## 2021-05-05 DIAGNOSIS — G4733 Obstructive sleep apnea (adult) (pediatric): Secondary | ICD-10-CM | POA: Diagnosis not present

## 2021-06-05 DIAGNOSIS — G4733 Obstructive sleep apnea (adult) (pediatric): Secondary | ICD-10-CM | POA: Diagnosis not present

## 2021-06-15 DIAGNOSIS — R7301 Impaired fasting glucose: Secondary | ICD-10-CM | POA: Diagnosis not present

## 2021-06-15 DIAGNOSIS — Z125 Encounter for screening for malignant neoplasm of prostate: Secondary | ICD-10-CM | POA: Diagnosis not present

## 2021-06-15 DIAGNOSIS — E785 Hyperlipidemia, unspecified: Secondary | ICD-10-CM | POA: Diagnosis not present

## 2021-06-22 DIAGNOSIS — I10 Essential (primary) hypertension: Secondary | ICD-10-CM | POA: Diagnosis not present

## 2021-06-22 DIAGNOSIS — R82998 Other abnormal findings in urine: Secondary | ICD-10-CM | POA: Diagnosis not present

## 2021-06-22 DIAGNOSIS — G4733 Obstructive sleep apnea (adult) (pediatric): Secondary | ICD-10-CM | POA: Diagnosis not present

## 2021-06-22 DIAGNOSIS — E8881 Metabolic syndrome: Secondary | ICD-10-CM | POA: Diagnosis not present

## 2021-06-22 DIAGNOSIS — R69 Illness, unspecified: Secondary | ICD-10-CM | POA: Diagnosis not present

## 2021-06-22 DIAGNOSIS — K921 Melena: Secondary | ICD-10-CM | POA: Diagnosis not present

## 2021-06-22 DIAGNOSIS — G250 Essential tremor: Secondary | ICD-10-CM | POA: Diagnosis not present

## 2021-06-22 DIAGNOSIS — Z1389 Encounter for screening for other disorder: Secondary | ICD-10-CM | POA: Diagnosis not present

## 2021-06-22 DIAGNOSIS — Z0000 Encounter for general adult medical examination without abnormal findings: Secondary | ICD-10-CM | POA: Diagnosis not present

## 2021-06-22 DIAGNOSIS — E785 Hyperlipidemia, unspecified: Secondary | ICD-10-CM | POA: Diagnosis not present

## 2021-06-22 DIAGNOSIS — R1310 Dysphagia, unspecified: Secondary | ICD-10-CM | POA: Diagnosis not present

## 2021-06-22 DIAGNOSIS — Z8673 Personal history of transient ischemic attack (TIA), and cerebral infarction without residual deficits: Secondary | ICD-10-CM | POA: Diagnosis not present

## 2021-06-22 DIAGNOSIS — R197 Diarrhea, unspecified: Secondary | ICD-10-CM | POA: Diagnosis not present

## 2021-06-22 DIAGNOSIS — Z1331 Encounter for screening for depression: Secondary | ICD-10-CM | POA: Diagnosis not present

## 2021-07-13 DIAGNOSIS — D225 Melanocytic nevi of trunk: Secondary | ICD-10-CM | POA: Diagnosis not present

## 2021-07-13 DIAGNOSIS — Z1283 Encounter for screening for malignant neoplasm of skin: Secondary | ICD-10-CM | POA: Diagnosis not present

## 2021-07-13 DIAGNOSIS — L570 Actinic keratosis: Secondary | ICD-10-CM | POA: Diagnosis not present

## 2021-07-13 DIAGNOSIS — X32XXXD Exposure to sunlight, subsequent encounter: Secondary | ICD-10-CM | POA: Diagnosis not present

## 2021-10-03 DIAGNOSIS — H26491 Other secondary cataract, right eye: Secondary | ICD-10-CM | POA: Diagnosis not present

## 2021-10-03 DIAGNOSIS — H52203 Unspecified astigmatism, bilateral: Secondary | ICD-10-CM | POA: Diagnosis not present

## 2021-10-03 DIAGNOSIS — H532 Diplopia: Secondary | ICD-10-CM | POA: Diagnosis not present

## 2021-10-03 DIAGNOSIS — H35373 Puckering of macula, bilateral: Secondary | ICD-10-CM | POA: Diagnosis not present

## 2021-10-10 DIAGNOSIS — H26491 Other secondary cataract, right eye: Secondary | ICD-10-CM | POA: Diagnosis not present

## 2021-10-13 DIAGNOSIS — Z23 Encounter for immunization: Secondary | ICD-10-CM | POA: Diagnosis not present

## 2021-10-17 DIAGNOSIS — G4733 Obstructive sleep apnea (adult) (pediatric): Secondary | ICD-10-CM | POA: Diagnosis not present

## 2021-11-05 ENCOUNTER — Ambulatory Visit: Payer: Medicare HMO | Admitting: Adult Health

## 2021-11-05 ENCOUNTER — Other Ambulatory Visit: Payer: Self-pay

## 2021-11-05 ENCOUNTER — Encounter: Payer: Self-pay | Admitting: Adult Health

## 2021-11-05 VITALS — BP 135/71 | HR 64 | Ht 70.5 in | Wt 200.8 lb

## 2021-11-05 DIAGNOSIS — Z9989 Dependence on other enabling machines and devices: Secondary | ICD-10-CM

## 2021-11-05 DIAGNOSIS — G4733 Obstructive sleep apnea (adult) (pediatric): Secondary | ICD-10-CM

## 2021-11-05 NOTE — Progress Notes (Signed)
PATIENT: Chris [name] DOB: 1946/10/27  REASON FOR VISIT: follow up HISTORY FROM: patient PRIMARY NEUROLOGIST:   HISTORY OF PRESENT ILLNESS: Today 11/05/21:  Chris [name] is a 75 year old male with a history of obstructive sleep apnea on CPAP.  He returns today for follow-up.  He reports that the CPAP continues to work fairly well for him.  He does state since he got this new machine he feels that he is getting too much air.  Also feels that at night his stomach feels up with air.  He denies any other issues.  He returns today for an evaluation.    HISTORY 10/30/20:   Chris [name] is a 75 year old male with a history of obstructive sleep apnea on CPAP.  His download is from July 15 August 13.  He uses machine nightly for compliance of 100%.  He uses machine greater than 4 hours each night.  On average he uses his machine 8 hours and 29 minutes.  His residual AHI is 4.5 on 9 cm of water.  He states that since the recall he has been back to using his old machine.  He notes that when he was using his new machine he developed a deep cough.  When he switched to his old machine the cough seemed to improve.  He also noticed black particles floating in his new machine.  He returns today for follow-up.     REVIEW OF SYSTEMS: Out of a complete 14 system review of symptoms, the patient complains only of the following symptoms, and all other reviewed systems are negative.   ESS 5  ALLERGIES: Allergies  Allergen Reactions   Morphine And Related Nausea And Vomiting    HOME MEDICATIONS: Outpatient Medications Prior to Visit  Medication Sig Dispense Refill   atorvastatin (LIPITOR) 40 MG tablet Take 40 mg by mouth daily at 6 PM.      clopidogrel (PLAVIX) 75 MG tablet Take 1 tablet (75 mg total) by mouth daily. (Patient taking differently: Take 75 mg by mouth at bedtime.) 30 tablet 0   fluticasone (FLONASE) 50 MCG/ACT nasal spray Place 1 spray into both nostrils daily as needed for allergies.       gabapentin (NEURONTIN) 100 MG capsule TAKE 1 CAPSULE 3X DAILY , CAN INCREASE TO 300 MG 3X DAILY AS NEEDED **MAY CAUSE SEDATION**     hydrochlorothiazide (HYDRODIURIL) 12.5 MG tablet Take 12.5 mg by mouth every morning.     ibuprofen (ADVIL,MOTRIN) 200 MG tablet Take 400 mg by mouth every 6 (six) hours as needed.     pantoprazole (PROTONIX) 40 MG tablet Take 40 mg by mouth at bedtime.      telmisartan (MICARDIS) 80 MG tablet Take 80 mg by mouth at bedtime.      No facility-administered medications prior to visit.    PAST MEDICAL HISTORY: Past Medical History:  Diagnosis Date   Acute CVA (cerebrovascular accident) ([place]) 03/25/2018   "double vision and balance issues" (03/26/2018)   ADD (attention deficit disorder)    Arthritis    "thumbs and fingers" (03/26/2018)   Diverticulosis    GERD (gastroesophageal reflux disease)    Heart murmur    "when I was young; not noted since age 58" (03/26/2018)   History of kidney stones    Hyperlipidemia    Hypertension    IFG (impaired fasting glucose)    Metabolic syndrome    OSA on CPAP    Pneumonia    "a time or 2" (03/26/2018)  PONV (postoperative nausea and vomiting)    Shingles ~ 2002   TIA (transient ischemic attack) 2013    PAST SURGICAL HISTORY: Past Surgical History:  Procedure Laterality Date   CARPAL TUNNEL RELEASE Right    CYSTOSCOPY W/ STONE MANIPULATION     FINGER SURGERY Left    thumb surgery; "took bone out; put tendon in"   TONSILLECTOMY AND ADENOIDECTOMY      FAMILY HISTORY: Family History  Problem Relation Age of Onset   Prostate cancer Father    Colon cancer Neg Hx    Esophageal cancer Neg Hx    Liver cancer Neg Hx    Rectal cancer Neg Hx     SOCIAL HISTORY: Social History   Socioeconomic History   Marital status: Married    Spouse name: Not on file   Number of children: Not on file   Years of education: Not on file   Highest education level: Not on file  Occupational History   Not on file  Tobacco Use    Smoking status: Never   Smokeless tobacco: Never  Vaping Use   Vaping Use: Never used  Substance and Sexual Activity   Alcohol use: No   Drug use: Never   Sexual activity: Not on file  Other Topics Concern   Not on file  Social History Narrative   Not on file   Social Determinants of Health   Financial Resource Strain: Not on file  Food Insecurity: Not on file  Transportation Needs: Not on file  Physical Activity: Not on file  Stress: Not on file  Social Connections: Not on file  Intimate Partner Violence: Not on file      PHYSICAL EXAM  Vitals:   11/05/21 1044  BP: 135/71  Pulse: 64  Weight: 200 lb 12.8 oz (91.1 kg)  Height: 5' 10.5" (1.791 m)   Body mass index is 28.4 kg/m.  Generalized: Well developed, in no acute distress  Chest: Lungs clear to auscultation bilaterally  Neurological examination  Mentation: Alert oriented to time, place, history taking. Follows all commands speech and language fluent Cranial nerve II-XII: Extraocular movements were full, visual field were full on confrontational test Head turning and shoulder shrug  were normal and symmetric. Motor: The motor testing reveals 5 over 5 strength of all 4 extremities. Good symmetric motor tone is noted throughout.  Sensory: Sensory testing is intact to soft touch on all 4 extremities. No evidence of extinction is noted.  Gait and station: Gait is normal.    DIAGNOSTIC DATA (LABS, IMAGING, TESTING) - I reviewed patient records, labs, notes, testing and imaging myself where available.  Lab Results  Component Value Date   WBC 9.7 10/20/2018   HGB 15.0 10/20/2018   HCT 44.0 10/20/2018   MCV 91.5 10/20/2018   PLT 273 10/20/2018      Component Value Date/Time   NA 141 10/20/2018 1417   K 4.2 10/20/2018 1417   CL 105 10/20/2018 1417   CO2 25 10/20/2018 1406   GLUCOSE 98 10/20/2018 1417   BUN 11 10/20/2018 1417   CREATININE 1.10 10/20/2018 1417   CALCIUM 9.3 10/20/2018 1406   PROT 7.0  10/20/2018 1406   ALBUMIN 4.4 10/20/2018 1406   AST 20 10/20/2018 1406   ALT 25 10/20/2018 1406   ALKPHOS 79 10/20/2018 1406   BILITOT 1.0 10/20/2018 1406   GFRNONAA >60 10/20/2018 1406   GFRAA >60 10/20/2018 1406   Lab Results  Component Value Date   CHOL 96  03/27/2018   HDL 33 (L) 03/27/2018   LDLCALC 45 03/27/2018   TRIG 92 03/27/2018   CHOLHDL 2.9 03/27/2018   Lab Results  Component Value Date   HGBA1C 6.1 (H) 03/27/2018     ASSESSMENT AND PLAN 75 y.o. year old male  has a past medical history of Acute CVA (cerebrovascular accident) ([place]) (03/25/2018), ADD (attention deficit disorder), Arthritis, Diverticulosis, GERD (gastroesophageal reflux disease), Heart murmur, History of kidney stones, Hyperlipidemia, Hypertension, IFG (impaired fasting glucose), Metabolic syndrome, OSA on CPAP, Pneumonia, PONV (postoperative nausea and vomiting), Shingles (~ 2002), and TIA (transient ischemic attack) (2013). here with:  OSA on CPAP  - CPAP compliance excellent - Good treatment of AHI  - Encourage patient to use CPAP nightly and > 4 hours each night -We will adjust pressure to 8 cm of water with EPR 2 - F/U in 1 year or sooner if needed    Ward Givens, MSN, NP-C 11/05/2021, 10:58 AM [facility] [address], [place], [place] [address] [phone]

## 2021-11-06 NOTE — Progress Notes (Addendum)
Order sent to [employer] [facility] for pressure change.  Received.  Kary Kos, RN got it!

## 2021-11-17 DIAGNOSIS — G4733 Obstructive sleep apnea (adult) (pediatric): Secondary | ICD-10-CM | POA: Diagnosis not present

## 2021-12-17 DIAGNOSIS — G4733 Obstructive sleep apnea (adult) (pediatric): Secondary | ICD-10-CM | POA: Diagnosis not present

## 2021-12-19 DIAGNOSIS — G4733 Obstructive sleep apnea (adult) (pediatric): Secondary | ICD-10-CM | POA: Diagnosis not present

## 2022-06-08 DIAGNOSIS — G4733 Obstructive sleep apnea (adult) (pediatric): Secondary | ICD-10-CM | POA: Diagnosis not present

## 2022-07-08 DIAGNOSIS — G4733 Obstructive sleep apnea (adult) (pediatric): Secondary | ICD-10-CM | POA: Diagnosis not present

## 2022-07-15 DIAGNOSIS — R7989 Other specified abnormal findings of blood chemistry: Secondary | ICD-10-CM | POA: Diagnosis not present

## 2022-07-15 DIAGNOSIS — E785 Hyperlipidemia, unspecified: Secondary | ICD-10-CM | POA: Diagnosis not present

## 2022-07-15 DIAGNOSIS — Z125 Encounter for screening for malignant neoplasm of prostate: Secondary | ICD-10-CM | POA: Diagnosis not present

## 2022-07-22 DIAGNOSIS — E781 Pure hyperglyceridemia: Secondary | ICD-10-CM | POA: Diagnosis not present

## 2022-07-22 DIAGNOSIS — Z1331 Encounter for screening for depression: Secondary | ICD-10-CM | POA: Diagnosis not present

## 2022-07-22 DIAGNOSIS — R82998 Other abnormal findings in urine: Secondary | ICD-10-CM | POA: Diagnosis not present

## 2022-07-22 DIAGNOSIS — E8881 Metabolic syndrome: Secondary | ICD-10-CM | POA: Diagnosis not present

## 2022-07-22 DIAGNOSIS — R202 Paresthesia of skin: Secondary | ICD-10-CM | POA: Diagnosis not present

## 2022-07-22 DIAGNOSIS — M5416 Radiculopathy, lumbar region: Secondary | ICD-10-CM | POA: Diagnosis not present

## 2022-07-22 DIAGNOSIS — G629 Polyneuropathy, unspecified: Secondary | ICD-10-CM | POA: Diagnosis not present

## 2022-07-22 DIAGNOSIS — E785 Hyperlipidemia, unspecified: Secondary | ICD-10-CM | POA: Diagnosis not present

## 2022-07-22 DIAGNOSIS — Z0000 Encounter for general adult medical examination without abnormal findings: Secondary | ICD-10-CM | POA: Diagnosis not present

## 2022-07-22 DIAGNOSIS — R7301 Impaired fasting glucose: Secondary | ICD-10-CM | POA: Diagnosis not present

## 2022-07-22 DIAGNOSIS — Z1339 Encounter for screening examination for other mental health and behavioral disorders: Secondary | ICD-10-CM | POA: Diagnosis not present

## 2022-07-22 DIAGNOSIS — N433 Hydrocele, unspecified: Secondary | ICD-10-CM | POA: Diagnosis not present

## 2022-07-22 DIAGNOSIS — K219 Gastro-esophageal reflux disease without esophagitis: Secondary | ICD-10-CM | POA: Diagnosis not present

## 2022-07-22 DIAGNOSIS — Z8673 Personal history of transient ischemic attack (TIA), and cerebral infarction without residual deficits: Secondary | ICD-10-CM | POA: Diagnosis not present

## 2022-07-22 DIAGNOSIS — R0982 Postnasal drip: Secondary | ICD-10-CM | POA: Diagnosis not present

## 2022-07-22 DIAGNOSIS — I10 Essential (primary) hypertension: Secondary | ICD-10-CM | POA: Diagnosis not present

## 2022-07-30 DIAGNOSIS — M25552 Pain in left hip: Secondary | ICD-10-CM | POA: Diagnosis not present

## 2022-07-30 DIAGNOSIS — M5116 Intervertebral disc disorders with radiculopathy, lumbar region: Secondary | ICD-10-CM | POA: Diagnosis not present

## 2022-07-30 DIAGNOSIS — M9903 Segmental and somatic dysfunction of lumbar region: Secondary | ICD-10-CM | POA: Diagnosis not present

## 2022-07-30 DIAGNOSIS — M9905 Segmental and somatic dysfunction of pelvic region: Secondary | ICD-10-CM | POA: Diagnosis not present

## 2022-08-01 DIAGNOSIS — M25552 Pain in left hip: Secondary | ICD-10-CM | POA: Diagnosis not present

## 2022-08-01 DIAGNOSIS — M5116 Intervertebral disc disorders with radiculopathy, lumbar region: Secondary | ICD-10-CM | POA: Diagnosis not present

## 2022-08-01 DIAGNOSIS — M9905 Segmental and somatic dysfunction of pelvic region: Secondary | ICD-10-CM | POA: Diagnosis not present

## 2022-08-01 DIAGNOSIS — M9903 Segmental and somatic dysfunction of lumbar region: Secondary | ICD-10-CM | POA: Diagnosis not present

## 2022-08-05 DIAGNOSIS — M5116 Intervertebral disc disorders with radiculopathy, lumbar region: Secondary | ICD-10-CM | POA: Diagnosis not present

## 2022-08-05 DIAGNOSIS — M9903 Segmental and somatic dysfunction of lumbar region: Secondary | ICD-10-CM | POA: Diagnosis not present

## 2022-08-05 DIAGNOSIS — M25552 Pain in left hip: Secondary | ICD-10-CM | POA: Diagnosis not present

## 2022-08-05 DIAGNOSIS — M9905 Segmental and somatic dysfunction of pelvic region: Secondary | ICD-10-CM | POA: Diagnosis not present

## 2022-08-06 DIAGNOSIS — M9905 Segmental and somatic dysfunction of pelvic region: Secondary | ICD-10-CM | POA: Diagnosis not present

## 2022-08-06 DIAGNOSIS — M5116 Intervertebral disc disorders with radiculopathy, lumbar region: Secondary | ICD-10-CM | POA: Diagnosis not present

## 2022-08-06 DIAGNOSIS — M25552 Pain in left hip: Secondary | ICD-10-CM | POA: Diagnosis not present

## 2022-08-06 DIAGNOSIS — M9903 Segmental and somatic dysfunction of lumbar region: Secondary | ICD-10-CM | POA: Diagnosis not present

## 2022-08-08 DIAGNOSIS — M9905 Segmental and somatic dysfunction of pelvic region: Secondary | ICD-10-CM | POA: Diagnosis not present

## 2022-08-08 DIAGNOSIS — M9903 Segmental and somatic dysfunction of lumbar region: Secondary | ICD-10-CM | POA: Diagnosis not present

## 2022-08-08 DIAGNOSIS — M47816 Spondylosis without myelopathy or radiculopathy, lumbar region: Secondary | ICD-10-CM | POA: Diagnosis not present

## 2022-08-08 DIAGNOSIS — M25552 Pain in left hip: Secondary | ICD-10-CM | POA: Diagnosis not present

## 2022-08-08 DIAGNOSIS — M5116 Intervertebral disc disorders with radiculopathy, lumbar region: Secondary | ICD-10-CM | POA: Diagnosis not present

## 2022-08-12 DIAGNOSIS — M9903 Segmental and somatic dysfunction of lumbar region: Secondary | ICD-10-CM | POA: Diagnosis not present

## 2022-08-12 DIAGNOSIS — M9905 Segmental and somatic dysfunction of pelvic region: Secondary | ICD-10-CM | POA: Diagnosis not present

## 2022-08-12 DIAGNOSIS — M5116 Intervertebral disc disorders with radiculopathy, lumbar region: Secondary | ICD-10-CM | POA: Diagnosis not present

## 2022-08-12 DIAGNOSIS — M25552 Pain in left hip: Secondary | ICD-10-CM | POA: Diagnosis not present

## 2022-08-13 DIAGNOSIS — M5116 Intervertebral disc disorders with radiculopathy, lumbar region: Secondary | ICD-10-CM | POA: Diagnosis not present

## 2022-08-13 DIAGNOSIS — M25552 Pain in left hip: Secondary | ICD-10-CM | POA: Diagnosis not present

## 2022-08-13 DIAGNOSIS — M9903 Segmental and somatic dysfunction of lumbar region: Secondary | ICD-10-CM | POA: Diagnosis not present

## 2022-08-13 DIAGNOSIS — M9905 Segmental and somatic dysfunction of pelvic region: Secondary | ICD-10-CM | POA: Diagnosis not present

## 2022-08-15 DIAGNOSIS — M9905 Segmental and somatic dysfunction of pelvic region: Secondary | ICD-10-CM | POA: Diagnosis not present

## 2022-08-15 DIAGNOSIS — M9903 Segmental and somatic dysfunction of lumbar region: Secondary | ICD-10-CM | POA: Diagnosis not present

## 2022-08-15 DIAGNOSIS — M5116 Intervertebral disc disorders with radiculopathy, lumbar region: Secondary | ICD-10-CM | POA: Diagnosis not present

## 2022-08-15 DIAGNOSIS — M25552 Pain in left hip: Secondary | ICD-10-CM | POA: Diagnosis not present

## 2022-08-19 DIAGNOSIS — M25552 Pain in left hip: Secondary | ICD-10-CM | POA: Diagnosis not present

## 2022-08-19 DIAGNOSIS — M5116 Intervertebral disc disorders with radiculopathy, lumbar region: Secondary | ICD-10-CM | POA: Diagnosis not present

## 2022-08-19 DIAGNOSIS — M9905 Segmental and somatic dysfunction of pelvic region: Secondary | ICD-10-CM | POA: Diagnosis not present

## 2022-08-19 DIAGNOSIS — M9903 Segmental and somatic dysfunction of lumbar region: Secondary | ICD-10-CM | POA: Diagnosis not present

## 2022-08-20 DIAGNOSIS — M9905 Segmental and somatic dysfunction of pelvic region: Secondary | ICD-10-CM | POA: Diagnosis not present

## 2022-08-20 DIAGNOSIS — M25552 Pain in left hip: Secondary | ICD-10-CM | POA: Diagnosis not present

## 2022-08-20 DIAGNOSIS — M9903 Segmental and somatic dysfunction of lumbar region: Secondary | ICD-10-CM | POA: Diagnosis not present

## 2022-08-20 DIAGNOSIS — M5116 Intervertebral disc disorders with radiculopathy, lumbar region: Secondary | ICD-10-CM | POA: Diagnosis not present

## 2022-08-22 DIAGNOSIS — M25552 Pain in left hip: Secondary | ICD-10-CM | POA: Diagnosis not present

## 2022-08-22 DIAGNOSIS — M9903 Segmental and somatic dysfunction of lumbar region: Secondary | ICD-10-CM | POA: Diagnosis not present

## 2022-08-22 DIAGNOSIS — M5116 Intervertebral disc disorders with radiculopathy, lumbar region: Secondary | ICD-10-CM | POA: Diagnosis not present

## 2022-08-22 DIAGNOSIS — M9905 Segmental and somatic dysfunction of pelvic region: Secondary | ICD-10-CM | POA: Diagnosis not present

## 2022-08-27 DIAGNOSIS — M9905 Segmental and somatic dysfunction of pelvic region: Secondary | ICD-10-CM | POA: Diagnosis not present

## 2022-08-27 DIAGNOSIS — M9903 Segmental and somatic dysfunction of lumbar region: Secondary | ICD-10-CM | POA: Diagnosis not present

## 2022-08-27 DIAGNOSIS — M5116 Intervertebral disc disorders with radiculopathy, lumbar region: Secondary | ICD-10-CM | POA: Diagnosis not present

## 2022-08-27 DIAGNOSIS — M25552 Pain in left hip: Secondary | ICD-10-CM | POA: Diagnosis not present

## 2022-08-29 DIAGNOSIS — M5116 Intervertebral disc disorders with radiculopathy, lumbar region: Secondary | ICD-10-CM | POA: Diagnosis not present

## 2022-08-29 DIAGNOSIS — M25552 Pain in left hip: Secondary | ICD-10-CM | POA: Diagnosis not present

## 2022-08-29 DIAGNOSIS — M9905 Segmental and somatic dysfunction of pelvic region: Secondary | ICD-10-CM | POA: Diagnosis not present

## 2022-08-29 DIAGNOSIS — M9903 Segmental and somatic dysfunction of lumbar region: Secondary | ICD-10-CM | POA: Diagnosis not present

## 2022-09-09 DIAGNOSIS — M9903 Segmental and somatic dysfunction of lumbar region: Secondary | ICD-10-CM | POA: Diagnosis not present

## 2022-09-09 DIAGNOSIS — M5116 Intervertebral disc disorders with radiculopathy, lumbar region: Secondary | ICD-10-CM | POA: Diagnosis not present

## 2022-09-09 DIAGNOSIS — M9905 Segmental and somatic dysfunction of pelvic region: Secondary | ICD-10-CM | POA: Diagnosis not present

## 2022-09-09 DIAGNOSIS — M25552 Pain in left hip: Secondary | ICD-10-CM | POA: Diagnosis not present

## 2022-09-11 DIAGNOSIS — M9905 Segmental and somatic dysfunction of pelvic region: Secondary | ICD-10-CM | POA: Diagnosis not present

## 2022-09-11 DIAGNOSIS — M9903 Segmental and somatic dysfunction of lumbar region: Secondary | ICD-10-CM | POA: Diagnosis not present

## 2022-09-11 DIAGNOSIS — M25552 Pain in left hip: Secondary | ICD-10-CM | POA: Diagnosis not present

## 2022-09-11 DIAGNOSIS — M5116 Intervertebral disc disorders with radiculopathy, lumbar region: Secondary | ICD-10-CM | POA: Diagnosis not present

## 2022-09-20 DIAGNOSIS — D2930 Benign neoplasm of unspecified epididymis: Secondary | ICD-10-CM | POA: Diagnosis not present

## 2022-10-05 DIAGNOSIS — Z23 Encounter for immunization: Secondary | ICD-10-CM | POA: Diagnosis not present

## 2022-10-21 DIAGNOSIS — H52203 Unspecified astigmatism, bilateral: Secondary | ICD-10-CM | POA: Diagnosis not present

## 2022-10-21 DIAGNOSIS — H35372 Puckering of macula, left eye: Secondary | ICD-10-CM | POA: Diagnosis not present

## 2022-10-21 DIAGNOSIS — H532 Diplopia: Secondary | ICD-10-CM | POA: Diagnosis not present

## 2022-11-06 ENCOUNTER — Ambulatory Visit: Payer: Medicare HMO | Admitting: Adult Health

## 2022-11-06 ENCOUNTER — Encounter: Payer: Self-pay | Admitting: Adult Health

## 2022-11-06 VITALS — BP 149/60 | HR 56 | Ht 71.0 in | Wt 199.2 lb

## 2022-11-06 DIAGNOSIS — G4733 Obstructive sleep apnea (adult) (pediatric): Secondary | ICD-10-CM

## 2022-11-06 NOTE — Progress Notes (Signed)
PATIENT: Chris [name] DOB: [birth year]  REASON FOR VISIT: follow up HISTORY FROM: patient PRIMARY NEUROLOGIST: Dr. Rexene Alberts  Chief Complaint  Patient presents with   Follow-up    Rm 16, alone.  No concerns with cpap. Doing fine. ESS 5, FSS 20.     HISTORY OF PRESENT ILLNESS: Today 11/06/22:  Chris [name] is a 76 year old male with a history of obstructive sleep apnea on CPAP.  He returns today for follow-up.  His download indicates that he uses machine nightly for compliance of 100%.  He uses machine greater than 4 hours each night.  On average he uses his machine 8 hours and 42 minutes.  His residual AHI is 10.4 on 8 cm of water with EPR 2.  Reports that sometimes he feels the mask leaking but is not bothersome to him.  11/05/21: Chris [name] is a 76 year old male with a history of obstructive sleep apnea on CPAP.  He returns today for follow-up.  He reports that the CPAP continues to work fairly well for him.  He does state since he got this new machine he feels that he is getting too much air.  Also feels that at night his stomach feels up with air.  He denies any other issues.  He returns today for an evaluation.    HISTORY 10/30/20:   Chris [name] is a 76 year old male with a history of obstructive sleep apnea on CPAP.  His download is from July 15 August 13.  He uses machine nightly for compliance of 100%.  He uses machine greater than 4 hours each night.  On average he uses his machine 8 hours and 29 minutes.  His residual AHI is 4.5 on 9 cm of water.  He states that since the recall he has been back to using his old machine.  He notes that when he was using his new machine he developed a deep cough.  When he switched to his old machine the cough seemed to improve.  He also noticed black particles floating in his new machine.  He returns today for follow-up.     REVIEW OF SYSTEMS: Out of a complete 14 system review of symptoms, the patient complains only of the following symptoms, and all  other reviewed systems are negative.   ESS 5 FSS 20  ALLERGIES: Allergies  Allergen Reactions   Morphine And Related Nausea And Vomiting    HOME MEDICATIONS: Outpatient Medications Prior to Visit  Medication Sig Dispense Refill   atorvastatin (LIPITOR) 40 MG tablet Take 40 mg by mouth daily at 6 PM.      clopidogrel (PLAVIX) 75 MG tablet Take 1 tablet (75 mg total) by mouth daily. (Patient taking differently: Take 75 mg by mouth at bedtime.) 30 tablet 0   famotidine (PEPCID) 20 MG tablet Take 20 mg by mouth daily.     fluticasone (FLONASE) 50 MCG/ACT nasal spray Place 1 spray into both nostrils daily as needed for allergies.      ibuprofen (ADVIL,MOTRIN) 200 MG tablet Take 400 mg by mouth every 6 (six) hours as needed.     telmisartan (MICARDIS) 80 MG tablet Take 80 mg by mouth at bedtime.      gabapentin (NEURONTIN) 100 MG capsule TAKE 1 CAPSULE 3X DAILY , CAN INCREASE TO 300 MG 3X DAILY AS NEEDED **MAY CAUSE SEDATION**     hydrochlorothiazide (HYDRODIURIL) 12.5 MG tablet Take 12.5 mg by mouth every morning.     pantoprazole (PROTONIX) 40 MG tablet Take 40  mg by mouth at bedtime.      No facility-administered medications prior to visit.    PAST MEDICAL HISTORY: Past Medical History:  Diagnosis Date   Acute CVA (cerebrovascular accident) ([place]) 03/25/2018   "double vision and balance issues" (03/26/2018)   ADD (attention deficit disorder)    Arthritis    "thumbs and fingers" (03/26/2018)   Diverticulosis    GERD (gastroesophageal reflux disease)    Heart murmur    "when I was young; not noted since age 74" (03/26/2018)   History of kidney stones    Hyperlipidemia    Hypertension    IFG (impaired fasting glucose)    Metabolic syndrome    OSA on CPAP    Pneumonia    "a time or 2" (03/26/2018)   PONV (postoperative nausea and vomiting)    Shingles ~ 2002   TIA (transient ischemic attack) 2013    PAST SURGICAL HISTORY: Past Surgical History:  Procedure Laterality Date    CARPAL TUNNEL RELEASE Right    CYSTOSCOPY W/ STONE MANIPULATION     FINGER SURGERY Left    thumb surgery; "took bone out; put tendon in"   TONSILLECTOMY AND ADENOIDECTOMY      FAMILY HISTORY: Family History  Problem Relation Age of Onset   Prostate cancer Father    GER disease Sister    Irritable bowel syndrome Sister    Colon cancer Neg Hx    Esophageal cancer Neg Hx    Liver cancer Neg Hx    Rectal cancer Neg Hx     SOCIAL HISTORY: Social History   Socioeconomic History   Marital status: Married    Spouse name: Not on file   Number of children: Not on file   Years of education: Not on file   Highest education level: Not on file  Occupational History   Not on file  Tobacco Use   Smoking status: Never   Smokeless tobacco: Never  Vaping Use   Vaping Use: Never used  Substance and Sexual Activity   Alcohol use: No   Drug use: Never   Sexual activity: Not on file  Other Topics Concern   Not on file  Social History Narrative   Not on file   Social Determinants of Health   Financial Resource Strain: Not on file  Food Insecurity: Not on file  Transportation Needs: Not on file  Physical Activity: Not on file  Stress: Not on file  Social Connections: Not on file  Intimate Partner Violence: Not on file      PHYSICAL EXAM  Vitals:   11/06/22 1037  BP: (!) 149/60  Pulse: (!) 56  Weight: 199 lb 3.2 oz (90.4 kg)  Height: '5\' 11"'$  (1.803 m)   Body mass index is 27.78 kg/m.  Generalized: Well developed, in no acute distress  Chest: Lungs clear to auscultation bilaterally  Neurological examination  Mentation: Alert oriented to time, place, history taking. Follows all commands speech and language fluent Cranial nerve II-XII: Extraocular movements were full, visual field were full on confrontational test Head turning and shoulder shrug  were normal and symmetric. Gait and station: Gait is normal.    DIAGNOSTIC DATA (LABS, IMAGING, TESTING) - I reviewed  patient records, labs, notes, testing and imaging myself where available.  Lab Results  Component Value Date   WBC 9.7 10/20/2018   HGB 15.0 10/20/2018   HCT 44.0 10/20/2018   MCV 91.5 10/20/2018   PLT 273 10/20/2018      Component  Value Date/Time   NA 141 10/20/2018 1417   K 4.2 10/20/2018 1417   CL 105 10/20/2018 1417   CO2 25 10/20/2018 1406   GLUCOSE 98 10/20/2018 1417   BUN 11 10/20/2018 1417   CREATININE 1.10 10/20/2018 1417   CALCIUM 9.3 10/20/2018 1406   PROT 7.0 10/20/2018 1406   ALBUMIN 4.4 10/20/2018 1406   AST 20 10/20/2018 1406   ALT 25 10/20/2018 1406   ALKPHOS 79 10/20/2018 1406   BILITOT 1.0 10/20/2018 1406   GFRNONAA >60 10/20/2018 1406   GFRAA >60 10/20/2018 1406   Lab Results  Component Value Date   CHOL 96 03/27/2018   HDL 33 (L) 03/27/2018   LDLCALC 45 03/27/2018   TRIG 92 03/27/2018   CHOLHDL 2.9 03/27/2018   Lab Results  Component Value Date   HGBA1C 6.1 (H) 03/27/2018     ASSESSMENT AND PLAN 76 y.o. year old male  has a past medical history of Acute CVA (cerebrovascular accident) ([place]) (03/25/2018), ADD (attention deficit disorder), Arthritis, Diverticulosis, GERD (gastroesophageal reflux disease), Heart murmur, History of kidney stones, Hyperlipidemia, Hypertension, IFG (impaired fasting glucose), Metabolic syndrome, OSA on CPAP, Pneumonia, PONV (postoperative nausea and vomiting), Shingles (~ 2002), and TIA (transient ischemic attack) (2013). here with:  OSA on CPAP  - CPAP compliance excellent -Residual AHI has increased to 10 - Encourage patient to use CPAP nightly and > 4 hours each night -We will increase pressure back in on cmH2O with EPR 2 due to residual AHI -Discussed mask refitting but he deferred for now - F/U in 1 year or sooner if needed    Ward Givens, MSN, NP-C 11/06/2022, 11:03 AM [facility] [address] [place], [place] [address] [phone]

## 2022-11-06 NOTE — Patient Instructions (Signed)
Continue using CPAP nightly and greater than 4 hours each night  Will increase pressure back to 9 If your symptoms worsen or you develop new symptoms please let us know.

## 2022-11-07 NOTE — Progress Notes (Addendum)
Order for pressure increase sent to [employer]. Receipt confirmed by [employer] on 11/08/22.

## 2022-11-12 ENCOUNTER — Encounter: Payer: Self-pay | Admitting: Adult Health

## 2022-11-19 DIAGNOSIS — Z0100 Encounter for examination of eyes and vision without abnormal findings: Secondary | ICD-10-CM | POA: Diagnosis not present

## 2022-12-06 DIAGNOSIS — G4733 Obstructive sleep apnea (adult) (pediatric): Secondary | ICD-10-CM | POA: Diagnosis not present

## 2023-01-06 DIAGNOSIS — G4733 Obstructive sleep apnea (adult) (pediatric): Secondary | ICD-10-CM | POA: Diagnosis not present

## 2023-02-26 DIAGNOSIS — G4733 Obstructive sleep apnea (adult) (pediatric): Secondary | ICD-10-CM | POA: Diagnosis not present

## 2023-03-18 DIAGNOSIS — H6123 Impacted cerumen, bilateral: Secondary | ICD-10-CM | POA: Diagnosis not present

## 2023-03-29 DIAGNOSIS — G4733 Obstructive sleep apnea (adult) (pediatric): Secondary | ICD-10-CM | POA: Diagnosis not present

## 2023-04-28 DIAGNOSIS — G4733 Obstructive sleep apnea (adult) (pediatric): Secondary | ICD-10-CM | POA: Diagnosis not present

## 2023-06-28 ENCOUNTER — Inpatient Hospital Stay (HOSPITAL_BASED_OUTPATIENT_CLINIC_OR_DEPARTMENT_OTHER)
Admission: EM | Admit: 2023-06-28 | Discharge: 2023-07-01 | DRG: 872 | Disposition: A | Discharge status: Home / Self Care | Payer: Medicare HMO | Attending: Internal Medicine | Admitting: Internal Medicine

## 2023-06-28 ENCOUNTER — Other Ambulatory Visit: Payer: Self-pay

## 2023-06-28 ENCOUNTER — Encounter (HOSPITAL_BASED_OUTPATIENT_CLINIC_OR_DEPARTMENT_OTHER): Payer: Self-pay

## 2023-06-28 DIAGNOSIS — A419 Sepsis, unspecified organism: Secondary | ICD-10-CM | POA: Diagnosis not present

## 2023-06-28 DIAGNOSIS — D649 Anemia, unspecified: Secondary | ICD-10-CM | POA: Diagnosis not present

## 2023-06-28 DIAGNOSIS — E785 Hyperlipidemia, unspecified: Secondary | ICD-10-CM | POA: Diagnosis present

## 2023-06-28 DIAGNOSIS — M19042 Primary osteoarthritis, left hand: Secondary | ICD-10-CM | POA: Diagnosis present

## 2023-06-28 DIAGNOSIS — E8881 Metabolic syndrome: Secondary | ICD-10-CM | POA: Diagnosis not present

## 2023-06-28 DIAGNOSIS — I69398 Other sequelae of cerebral infarction: Secondary | ICD-10-CM | POA: Diagnosis not present

## 2023-06-28 DIAGNOSIS — N390 Urinary tract infection, site not specified: Secondary | ICD-10-CM | POA: Diagnosis not present

## 2023-06-28 DIAGNOSIS — I10 Essential (primary) hypertension: Secondary | ICD-10-CM | POA: Diagnosis not present

## 2023-06-28 DIAGNOSIS — E872 Acidosis, unspecified: Secondary | ICD-10-CM | POA: Diagnosis not present

## 2023-06-28 DIAGNOSIS — E871 Hypo-osmolality and hyponatremia: Secondary | ICD-10-CM | POA: Diagnosis not present

## 2023-06-28 DIAGNOSIS — K219 Gastro-esophageal reflux disease without esophagitis: Secondary | ICD-10-CM | POA: Diagnosis present

## 2023-06-28 DIAGNOSIS — Z79899 Other long term (current) drug therapy: Secondary | ICD-10-CM

## 2023-06-28 DIAGNOSIS — Z8673 Personal history of transient ischemic attack (TIA), and cerebral infarction without residual deficits: Secondary | ICD-10-CM

## 2023-06-28 DIAGNOSIS — Z885 Allergy status to narcotic agent status: Secondary | ICD-10-CM | POA: Diagnosis not present

## 2023-06-28 DIAGNOSIS — R310 Gross hematuria: Secondary | ICD-10-CM | POA: Diagnosis not present

## 2023-06-28 DIAGNOSIS — F988 Other specified behavioral and emotional disorders with onset usually occurring in childhood and adolescence: Secondary | ICD-10-CM | POA: Diagnosis not present

## 2023-06-28 DIAGNOSIS — Z7902 Long term (current) use of antithrombotics/antiplatelets: Secondary | ICD-10-CM

## 2023-06-28 DIAGNOSIS — D72829 Elevated white blood cell count, unspecified: Secondary | ICD-10-CM | POA: Diagnosis not present

## 2023-06-28 DIAGNOSIS — R6520 Severe sepsis without septic shock: Secondary | ICD-10-CM | POA: Diagnosis present

## 2023-06-28 DIAGNOSIS — Z8042 Family history of malignant neoplasm of prostate: Secondary | ICD-10-CM

## 2023-06-28 DIAGNOSIS — I69393 Ataxia following cerebral infarction: Secondary | ICD-10-CM

## 2023-06-28 DIAGNOSIS — M19041 Primary osteoarthritis, right hand: Secondary | ICD-10-CM | POA: Diagnosis present

## 2023-06-28 DIAGNOSIS — G4733 Obstructive sleep apnea (adult) (pediatric): Secondary | ICD-10-CM | POA: Diagnosis present

## 2023-06-28 DIAGNOSIS — H532 Diplopia: Secondary | ICD-10-CM | POA: Diagnosis present

## 2023-06-28 DIAGNOSIS — A4151 Sepsis due to Escherichia coli [E. coli]: Secondary | ICD-10-CM | POA: Diagnosis not present

## 2023-06-28 DIAGNOSIS — E861 Hypovolemia: Secondary | ICD-10-CM | POA: Diagnosis present

## 2023-06-28 DIAGNOSIS — R300 Dysuria: Secondary | ICD-10-CM | POA: Diagnosis not present

## 2023-06-28 LAB — CBC WITH DIFFERENTIAL/PLATELET
Abs Immature Granulocytes: 0.25 10*3/uL — ABNORMAL HIGH (ref 0.00–0.07)
Basophils Absolute: 0.1 10*3/uL (ref 0.0–0.1)
Basophils Relative: 0 %
Eosinophils Absolute: 0 10*3/uL (ref 0.0–0.5)
Eosinophils Relative: 0 %
HCT: 41.9 % (ref 39.0–52.0)
Hemoglobin: 14.6 g/dL (ref 13.0–17.0)
Immature Granulocytes: 1 %
Lymphocytes Relative: 3 %
Lymphs Abs: 1 10*3/uL (ref 0.7–4.0)
MCH: 30.8 pg (ref 26.0–34.0)
MCHC: 34.8 g/dL (ref 30.0–36.0)
MCV: 88.4 fL (ref 80.0–100.0)
Monocytes Absolute: 2.1 10*3/uL — ABNORMAL HIGH (ref 0.1–1.0)
Monocytes Relative: 7 %
Neutro Abs: 26.2 10*3/uL — ABNORMAL HIGH (ref 1.7–7.7)
Neutrophils Relative %: 89 %
Platelets: 251 10*3/uL (ref 150–400)
RBC: 4.74 MIL/uL (ref 4.22–5.81)
RDW: 13.7 % (ref 11.5–15.5)
WBC: 29.7 10*3/uL — ABNORMAL HIGH (ref 4.0–10.5)
nRBC: 0 % (ref 0.0–0.2)

## 2023-06-28 LAB — URINALYSIS, ROUTINE W REFLEX MICROSCOPIC
Bilirubin Urine: NEGATIVE
Glucose, UA: NEGATIVE mg/dL
Ketones, ur: NEGATIVE mg/dL
Nitrite: NEGATIVE
Protein, ur: 30 mg/dL — AB
RBC / HPF: >50 RBC/hpf (ref 0–5)
Specific Gravity, Urine: 1.024 (ref 1.005–1.030)
WBC, UA: >50 WBC/hpf (ref 0–5)
pH: 6 (ref 5.0–8.0)

## 2023-06-28 LAB — LACTIC ACID, PLASMA
Lactic Acid, Venous: 1.3 mmol/L (ref 0.5–1.9)
Lactic Acid, Venous: 2.9 mmol/L (ref 0.5–1.9)
Lactic Acid, Venous: 2.8 mmol/L (ref 0.5–1.9)

## 2023-06-28 LAB — COMPREHENSIVE METABOLIC PANEL
ALT: 20 U/L (ref 0–44)
AST: 25 U/L (ref 15–41)
Albumin: 4.8 g/dL (ref 3.5–5.0)
Alkaline Phosphatase: 84 U/L (ref 38–126)
Anion gap: 8 (ref 5–15)
BUN: 13 mg/dL (ref 8–23)
CO2: 26 mmol/L (ref 22–32)
Calcium: 9.7 mg/dL (ref 8.9–10.3)
Chloride: 98 mmol/L (ref 98–111)
Creatinine, Ser: 1.19 mg/dL (ref 0.61–1.24)
GFR, Estimated: >60 mL/min (ref >60)
Glucose, Bld: 131 mg/dL — ABNORMAL HIGH (ref 70–99)
Potassium: 5 mmol/L (ref 3.5–5.1)
Sodium: 132 mmol/L — ABNORMAL LOW (ref 135–145)
Total Bilirubin: 1.7 mg/dL — ABNORMAL HIGH (ref 0.3–1.2)
Total Protein: 7.6 g/dL (ref 6.5–8.1)

## 2023-06-28 LAB — CULTURE, BLOOD (ROUTINE X 2): Special Requests: ADEQUATE

## 2023-06-28 MED ORDER — ACETAMINOPHEN 325 MG PO TABS
650.0000 mg | ORAL_TABLET | Freq: Once | ORAL | Status: AC
  Administered 2023-06-28: 650 mg via ORAL
  Filled 2023-06-28: qty 2

## 2023-06-28 MED ORDER — FAMOTIDINE 20 MG PO TABS
20.0000 mg | ORAL_TABLET | Freq: Every day | ORAL | Status: DC
  Administered 2023-06-28 – 2023-07-01 (×4): 20 mg via ORAL
  Filled 2023-06-28 (×4): qty 1

## 2023-06-28 MED ORDER — CLOPIDOGREL BISULFATE 75 MG PO TABS
75.0000 mg | ORAL_TABLET | Freq: Every day | ORAL | Status: DC
  Administered 2023-06-28 – 2023-06-30 (×3): 75 mg via ORAL
  Filled 2023-06-28 (×3): qty 1

## 2023-06-28 MED ORDER — ACETAMINOPHEN 325 MG PO TABS
650.0000 mg | ORAL_TABLET | Freq: Four times a day (QID) | ORAL | Status: DC | PRN
  Administered 2023-06-29 (×2): 650 mg via ORAL
  Filled 2023-06-28 (×2): qty 2

## 2023-06-28 MED ORDER — ONDANSETRON HCL 4 MG/2ML IJ SOLN: 4.0000 mg | Freq: Four times a day (QID) | INTRAMUSCULAR | Status: DC | PRN

## 2023-06-28 MED ORDER — ATORVASTATIN CALCIUM 40 MG PO TABS
40.0000 mg | ORAL_TABLET | Freq: Every day | ORAL | Status: DC
  Administered 2023-06-29 – 2023-06-30 (×2): 40 mg via ORAL
  Filled 2023-06-28 (×2): qty 1

## 2023-06-28 MED ORDER — SODIUM CHLORIDE 0.9 % IV SOLN
1.0000 g | INTRAVENOUS | Status: DC
  Administered 2023-06-29 – 2023-07-01 (×3): 1 g via INTRAVENOUS
  Filled 2023-06-28 (×3): qty 10

## 2023-06-28 MED ORDER — SODIUM CHLORIDE 0.9 % IV SOLN
1.0000 g | Freq: Once | INTRAVENOUS | Status: AC
  Administered 2023-06-28: 1 g via INTRAVENOUS
  Filled 2023-06-28: qty 10

## 2023-06-28 MED ORDER — LACTATED RINGERS IV BOLUS
500.0000 mL | Freq: Once | INTRAVENOUS | Status: AC
  Administered 2023-06-28: 500 mL via INTRAVENOUS

## 2023-06-28 MED ORDER — ONDANSETRON HCL 4 MG PO TABS: 4.0000 mg | ORAL_TABLET | Freq: Four times a day (QID) | ORAL | Status: DC | PRN

## 2023-06-28 MED ORDER — ENOXAPARIN SODIUM 40 MG/0.4ML IJ SOSY
40.0000 mg | PREFILLED_SYRINGE | INTRAMUSCULAR | Status: DC
  Administered 2023-06-28 – 2023-06-30 (×3): 40 mg via SUBCUTANEOUS
  Filled 2023-06-28 (×3): qty 0.4

## 2023-06-28 MED ORDER — LACTATED RINGERS IV BOLUS
1000.0000 mL | Freq: Once | INTRAVENOUS | Status: AC
  Administered 2023-06-28: 1000 mL via INTRAVENOUS

## 2023-06-28 MED ORDER — IRBESARTAN 300 MG PO TABS
300.0000 mg | ORAL_TABLET | Freq: Every day | ORAL | Status: DC
  Administered 2023-06-28 – 2023-07-01 (×4): 300 mg via ORAL
  Filled 2023-06-28 (×4): qty 1

## 2023-06-28 MED ORDER — LACTATED RINGERS IV SOLN: INTRAVENOUS | Status: AC

## 2023-06-28 MED ORDER — ACETAMINOPHEN 650 MG RE SUPP: 650.0000 mg | Freq: Four times a day (QID) | RECTAL | Status: DC | PRN

## 2023-06-28 NOTE — Assessment & Plan Note (Signed)
Continue plavix +statin  

## 2023-06-28 NOTE — Assessment & Plan Note (Addendum)
77 year old male presenting with one day of rigors and dysuria, urgency and frequency with blood tinged urine found to be septic from a UTI -admit to progressive  -sepsis criteria with leukocytosis, fever tachycardia and lactic acidosis.  -Urine looks infected and appears to be source -No CVA tenderness on exam, does have history of kidney stones, but no new back pain  -BC obtained (1 before abx an 1 after abx) -follow urine culture -continue rocephin  -received 2L IVF in ED, will give additional 500cc bolus for 30mg /kg then routine IVF -trend lactic acid and CBC

## 2023-06-28 NOTE — H&P (Signed)
History and Physical    Patient: Chris [name] OZH:[id number] DOB: 06-29-1946 DOA: 06/28/2023 DOS: the patient was seen and examined on 06/28/2023 PCP: Cleatis Polka., MD  Patient coming from:  [facility]  - lives with his wife    Chief Complaint: rigors/dysuria   HPI: Chris [name] is a 77 y.o. male with medical history significant of hx of CVA, ADD, GERD, HLD, HTN, metabolic syndrome,  OSA on cpap who presented to ED after having a night of rigors then waking with dysuria, frequency and urgency and blood tinged urine. He did not check his temperature. PO intake poor, has not ate or drank anything since noon yesterday. Had some mild discomfort in his RLQ, but this has subsided. Denies any N/V/D, but had an episode of nausea today in ED. Has had some dull back pain, unsure if attributable to this. History of kidney stones in the past.    Denies any fever, vision changes/headaches, chest pain or palpitations, shortness of breath or cough, abdominal pain, V/D, leg swelling.   He does not smoke or drink alcohol.    ER Course:  vitals: tmax: 101.3, bp: 139/75, HR: 92, RR: 17, oxygen: 98%RA Pertinent labs: wbc: 29.7, sodium: 132, lactic acid: 1.3>2.9, UA: large hgb, 30 protein, >50 WBC In ED: given 2L IVF bolus, BC obtained (1 before abx, 1 after) rocephin and tylenol. TRH asked to admit.    Review of Systems: As mentioned in the history of present illness. All other systems reviewed and are negative. Past Medical History:  Diagnosis Date   Acute CVA (cerebrovascular accident) (HCC) 03/25/2018   "double vision and balance issues" (03/26/2018)   ADD (attention deficit disorder)    Arthritis    "thumbs and fingers" (03/26/2018)   Diverticulosis    GERD (gastroesophageal reflux disease)    Heart murmur    "when I was young; not noted since age 28" (03/26/2018)   History of kidney stones    Hyperlipidemia    Hypertension    IFG (impaired fasting glucose)    Metabolic syndrome    OSA on CPAP     Pneumonia    "a time or 2" (03/26/2018)   PONV (postoperative nausea and vomiting)    Shingles ~ 2002   TIA (transient ischemic attack) 2013   Past Surgical History:  Procedure Laterality Date   CARPAL TUNNEL RELEASE Right    CYSTOSCOPY W/ STONE MANIPULATION     FINGER SURGERY Left    thumb surgery; "took bone out; put tendon in"   TONSILLECTOMY AND ADENOIDECTOMY     Social History:  reports that he has never smoked. He has never used smokeless tobacco. He reports that he does not drink alcohol and does not use drugs.  Allergies  Allergen Reactions   Morphine And Codeine Nausea And Vomiting    Family History  Problem Relation Age of Onset   Prostate cancer Father    GER disease Sister    Irritable bowel syndrome Sister    Colon cancer Neg Hx    Esophageal cancer Neg Hx    Liver cancer Neg Hx    Rectal cancer Neg Hx     Prior to Admission medications   Medication Sig Start Date End Date Taking? Authorizing Provider  atorvastatin (LIPITOR) 40 MG tablet Take 40 mg by mouth daily at 6 PM.     [provider]  clopidogrel (PLAVIX) 75 MG tablet Take 1 tablet (75 mg total) by mouth daily. Patient taking differently:  Take 75 mg by mouth at bedtime. 03/28/18   Tyrone Nine, MD  famotidine (PEPCID) 20 MG tablet Take 20 mg by mouth daily.    [provider]  fluticasone (FLONASE) 50 MCG/ACT nasal spray Place 1 spray into both nostrils daily as needed for allergies.     [provider]  ibuprofen (ADVIL,MOTRIN) 200 MG tablet Take 400 mg by mouth every 6 (six) hours as needed.    [provider]  telmisartan (MICARDIS) 80 MG tablet Take 80 mg by mouth at bedtime.     [provider]    Physical Exam: Vitals:   06/28/23 1500 06/28/23 1530 06/28/23 1550 06/28/23 1623  BP: (!) 129/56 136/70  (!) 149/67  Pulse: 91 90  88  Resp: 18 16  17   Temp:   99.3 F (37.4 C) 98.7 F (37.1 C)  TempSrc:   Oral Oral  SpO2: 97% 97%  95%  Weight:       Height:       General:  Appears calm and comfortable and is in NAD Eyes:  PERRL, EOMI, normal lids, iris ENT:  grossly normal hearing, lips & tongue, dry mucous membranes; appropriate dentition Neck:  no LAD, masses or thyromegaly; no carotid bruits Cardiovascular:  RRR, no m/r/g. No LE edema.  Respiratory:   CTA bilaterally with no wheezes/rales/rhonchi.  Normal respiratory effort. Abdomen:  soft, NT, ND, NABS Back:   normal alignment, no CVAT Skin:  no rash or induration seen on limited exam. +skin tenting. Normal cap refill  Musculoskeletal:  grossly normal tone BUE/BLE, good ROM, no bony abnormality Lower extremity:  No LE edema.  Limited foot exam with no ulcerations.  2+ distal pulses. Psychiatric:  grossly normal mood and affect, speech fluent and appropriate, AOx3 Neurologic:  CN 2-12 grossly intact, moves all extremities in coordinated fashion, sensation intact   Radiological Exams on Admission: Independently reviewed - see discussion in A/P where applicable  No results found.   Labs on Admission: I have personally reviewed the available labs and imaging studies at the time of the admission.  Pertinent labs:   wbc: 29.7,  sodium: 132,  lactic acid: 1.3>2.9,  UA: large hgb, 30 protein, >50 WBC  Assessment and Plan: Principal Problem:   Sepsis secondary to UTI (HCC) Active Problems:   Hyponatremia   Hyperlipidemia   OSA on CPAP   Hypertension   GERD (gastroesophageal reflux disease)   History of stroke    Assessment and Plan: * Sepsis secondary to UTI [facility]) 77 year old male presenting with one day of rigors and dysuria, urgency and frequency with blood tinged urine found to be septic from a UTI -admit to med surg -sepsis criteria with leukocytosis, fever tachycardia and lactic acidosis.  -Urine looks infected and appears to be source -No CVA tenderness on exam, does have history of kidney stones, but no new back pain  -BC obtained (1 before abx an 1 after  abx) -follow urine culture -continue rocephin  -received 2L IVF in ED, will give additional 500cc bolus for 30mg /kg then routine IVF -trend lactic acid and CBC   Hyponatremia Likely due to hypovolemia hyponatremia in setting of sepsis/poor PO intake  Fluid resuscitation in ED and continue maintenance IVF Trend   History of stroke Continue plavix/statin   GERD (gastroesophageal reflux disease) Continue daily pepcid   Hypertension Continue micardis 80mg  daily   OSA on CPAP Declines while in hospital   Hyperlipidemia Continue lipitor daily  Advance Care Planning:   Code Status: Full Code   Consults: none   DVT Prophylaxis: lovenox   Family Communication: wife at bedside   Severity of Illness: The appropriate patient status for this patient is INPATIENT. Inpatient status is judged to be reasonable and necessary in order to provide the required intensity of service to ensure the patient's safety. The patient's presenting symptoms, physical exam findings, and initial radiographic and laboratory data in the context of their chronic comorbidities is felt to place them at high risk for further clinical deterioration. Furthermore, it is not anticipated that the patient will be medically stable for discharge from the hospital within 2 midnights of admission.   * I certify that at the point of admission it is my clinical judgment that the patient will require inpatient hospital care spanning beyond 2 midnights from the point of admission due to high intensity of service, high risk for further deterioration and high frequency of surveillance required.*  Author: Orland Mustard, MD 06/28/2023 6:15 PM  For on call review [email or web address].

## 2023-06-28 NOTE — Assessment & Plan Note (Signed)
Continue lipitor daily  ?

## 2023-06-28 NOTE — ED Notes (Signed)
Attempted to call the floor to give report with no answer.

## 2023-06-28 NOTE — Assessment & Plan Note (Signed)
Likely due to hypovolemia hyponatremia in setting of sepsis/poor PO intake  Fluid resuscitation in ED and continue maintenance IVF Trend

## 2023-06-28 NOTE — Assessment & Plan Note (Signed)
Declines while in hospital

## 2023-06-28 NOTE — Assessment & Plan Note (Signed)
Continue micardis 80mg  daily

## 2023-06-28 NOTE — Plan of Care (Addendum)
Called for Direct admission  77 y/o male who presented to [facility] for nausea, chills and back pain. In ED> UA +, WBC 29, Lactic acid 1.3> 2.9, NA 132.   Continued Rigors. Temp 101.3.   Ceftriaxone, IVF given. He has urine and blood cultures ordered but blood culture were drawn after the antibiotic as the was given as the original plan was to discharge him home.    Addendum: per Dr Dalene Seltzer, one blood culture was drawn after the antibiotic and one before.   Calvert Cantor, MD

## 2023-06-28 NOTE — ED Notes (Signed)
[facility] called -- bed assignment is ready, waiting for transport 

## 2023-06-28 NOTE — ED Notes (Signed)
Urine specimen in lab 

## 2023-06-28 NOTE — Assessment & Plan Note (Signed)
Continue daily pepcid.  

## 2023-06-28 NOTE — ED Provider Notes (Signed)
[place] EMERGENCY DEPARTMENT AT [facility] Provider Note   CSN: [id number] Arrival date & time: 06/28/23  0932     History  Chief Complaint  Patient presents with   Dysuria    Chris [name] is a 77 y.o. male.  HPI     77 year old male with a history of CVA, hypertension, hyperlipidemia, presents with concern for chills and dysuria.  Reports he had some mild lower back pain yesterday in the middle of the back which is continuing some today.  Last night, developed severe shaking chills making it difficult to sleep, and nausea as well as dysuria.  Did note some blood in the urine.  Urgency had some brief left lower quadrant pain that resolved initially, denies any current abdominal pain.  Denies any known fevers, cough, shortness of breath, chest pain.  He reports he feels very fatigued in addition to these symptoms.  Past Medical History:  Diagnosis Date   Acute CVA (cerebrovascular accident) (HCC) 03/25/2018   "double vision and balance issues" (03/26/2018)   ADD (attention deficit disorder)    Arthritis    "thumbs and fingers" (03/26/2018)   Diverticulosis    GERD (gastroesophageal reflux disease)    Heart murmur    "when I was young; not noted since age 61" (03/26/2018)   History of kidney stones    Hyperlipidemia    Hypertension    IFG (impaired fasting glucose)    Metabolic syndrome    OSA on CPAP    Pneumonia    "a time or 2" (03/26/2018)   PONV (postoperative nausea and vomiting)    Shingles ~ 2002   TIA (transient ischemic attack) 2013     Home Medications Prior to Admission medications   Medication Sig Start Date End Date Taking? Authorizing Provider  atorvastatin (LIPITOR) 40 MG tablet Take 40 mg by mouth daily at 6 PM.    Yes [provider]  clopidogrel (PLAVIX) 75 MG tablet Take 1 tablet (75 mg total) by mouth daily. Patient taking differently: Take 75 mg by mouth at bedtime. 03/28/18  Yes Tyrone Nine, MD  famotidine (PEPCID) 20 MG tablet  Take 20 mg by mouth daily.   Yes [provider]  fluticasone (FLONASE) 50 MCG/ACT nasal spray Place 1 spray into both nostrils daily as needed for allergies.    Yes [provider]  ibuprofen (ADVIL,MOTRIN) 200 MG tablet Take 400 mg by mouth every 6 (six) hours as needed.   Yes [provider]  telmisartan (MICARDIS) 80 MG tablet Take 80 mg by mouth at bedtime.    Yes [provider]      Allergies    Morphine and codeine    Review of Systems   Review of Systems  Physical Exam Updated Vital Signs BP 135/64 (BP Location: Right Arm)   Pulse 77   Temp 98.5 F (36.9 C) (Oral)   Resp 20   Ht 5' 10.5" (1.791 m)   Wt 88 kg   SpO2 99%   BMI 27.44 kg/m  Physical Exam Vitals and nursing note reviewed.  Constitutional:      General: He is not in acute distress.    Appearance: He is well-developed. He is not diaphoretic.  HENT:     Head: Normocephalic and atraumatic.  Eyes:     Conjunctiva/sclera: Conjunctivae normal.  Cardiovascular:     Rate and Rhythm: Normal rate and regular rhythm.     Heart sounds: Normal heart sounds. No murmur heard.  No friction rub. No gallop.  Pulmonary:     Effort: Pulmonary effort is normal. No respiratory distress.     Breath sounds: Normal breath sounds. No wheezing or rales.  Abdominal:     General: There is no distension.     Palpations: Abdomen is soft.     Tenderness: There is no abdominal tenderness. There is no guarding.  Musculoskeletal:     Cervical back: Normal range of motion.  Skin:    General: Skin is warm and dry.  Neurological:     Mental Status: He is alert and oriented to person, place, and time.     ED Results / Procedures / Treatments   Labs (all labs ordered are listed, but only abnormal results are displayed) Labs Reviewed  URINALYSIS, ROUTINE W REFLEX MICROSCOPIC - Abnormal; Notable for the following components:      Result Value   APPearance CLOUDY (*)    Hgb urine dipstick  LARGE (*)    Protein, ur 30 (*)    Leukocytes,Ua LARGE (*)    Bacteria, UA RARE (*)    Non Squamous Epithelial 0-5 (*)    All other components within normal limits  CBC WITH DIFFERENTIAL/PLATELET - Abnormal; Notable for the following components:   WBC 29.7 (*)    Neutro Abs 26.2 (*)    Monocytes Absolute 2.1 (*)    Abs Immature Granulocytes 0.25 (*)    All other components within normal limits  COMPREHENSIVE METABOLIC PANEL - Abnormal; Notable for the following components:   Sodium 132 (*)    Glucose, Bld 131 (*)    Total Bilirubin 1.7 (*)    All other components within normal limits  LACTIC ACID, PLASMA - Abnormal; Notable for the following components:   Lactic Acid, Venous 2.9 (*)    All other components within normal limits  LACTIC ACID, PLASMA - Abnormal; Notable for the following components:   Lactic Acid, Venous 2.8 (*)    All other components within normal limits  CULTURE, BLOOD (ROUTINE X 2)  CULTURE, BLOOD (ROUTINE X 2)  URINE CULTURE  LACTIC ACID, PLASMA  BASIC METABOLIC PANEL  CBC    EKG None  Radiology No results found.  Procedures Procedures    Medications Ordered in ED Medications  cefTRIAXone (ROCEPHIN) 1 g in sodium chloride 0.9 % 100 mL IVPB (has no administration in time range)  lactated ringers infusion ( Intravenous New Bag/Given 06/28/23 1940)  enoxaparin (LOVENOX) injection 40 mg (40 mg Subcutaneous Given 06/28/23 2109)  acetaminophen (TYLENOL) tablet 650 mg (has no administration in time range)    Or  acetaminophen (TYLENOL) suppository 650 mg (has no administration in time range)  ondansetron (ZOFRAN) tablet 4 mg (has no administration in time range)    Or  ondansetron (ZOFRAN) injection 4 mg (has no administration in time range)  irbesartan (AVAPRO) tablet 300 mg (300 mg Oral Given 06/28/23 2110)  atorvastatin (LIPITOR) tablet 40 mg (has no administration in time range)  famotidine (PEPCID) tablet 20 mg (20 mg Oral Given 06/28/23 2110)   clopidogrel (PLAVIX) tablet 75 mg (75 mg Oral Given 06/28/23 2110)  cefTRIAXone (ROCEPHIN) 1 g in sodium chloride 0.9 % 100 mL IVPB (0 g Intravenous Stopped 06/28/23 1201)  lactated ringers bolus 1,000 mL ( Intravenous Stopped 06/28/23 1530)  lactated ringers bolus 1,000 mL ( Intravenous Stopped 06/28/23 1530)  acetaminophen (TYLENOL) tablet 650 mg (650 mg Oral Given 06/28/23 1433)  lactated ringers bolus 500 mL (500 mLs Intravenous New Bag/Given 06/28/23  [email or web address])    ED Course/ Medical Decision Making/ A&P                               77 year old male with a history of CVA, hypertension, hyperlipidemia, presents with concern for chills and dysuria.  UA consistent with UTI, Culture sent. Given systemic symptoms of fatigue, chills, nausea, sent labs for further evaluation and ordered IV rocephin.  No significant abdominal pain, or significant flank pain and doubt nephrolithiasis, perinephric abscess or other intraabdominal process.  Labs completed and personally evaluated by me shows WBC 29700, electrolytes without significant abnormalities.  Initial lactic acid WNL, repeat 2.9 (did not receive fluids initially).  Has rigors, fatigue, developing fever. Suspect developing sepsis secondary to UTI.   Will admit for continued care.  1 set of blood cultures drawn with initial blood draw, one after antibiotics initiated.           Final Clinical Impression(s) / ED Diagnoses Final diagnoses:  Sepsis without acute organ dysfunction, due to unspecified organism [facility])  Urinary tract infection without hematuria, site unspecified  Leukocytosis, unspecified type  Lactic acidosis    Rx / DC Orders ED Discharge Orders     None         Alvira Monday, MD 06/28/23 2138

## 2023-06-28 NOTE — ED Triage Notes (Signed)
Pt c/o chills last night. Pt states he has had painful. Urination with some blood in urine.

## 2023-06-28 NOTE — ED Notes (Signed)
CRITICAL VALUE STICKER  CRITICAL VALUE:Lactate 2.9  RECEIVER (on-site recipient of call):Carmon Ginsberg, RN  DATE & TIME NOTIFIED:   MESSENGER (representative from lab):  MD NOTIFIED: Dr. Dalene Seltzer  TIME OF NOTIFICATION:1340  RESPONSE:

## 2023-06-29 DIAGNOSIS — A419 Sepsis, unspecified organism: Secondary | ICD-10-CM | POA: Diagnosis not present

## 2023-06-29 DIAGNOSIS — N390 Urinary tract infection, site not specified: Secondary | ICD-10-CM | POA: Diagnosis not present

## 2023-06-29 LAB — LACTIC ACID, PLASMA: Lactic Acid, Venous: 1.8 mmol/L (ref 0.5–1.9)

## 2023-06-29 LAB — CBC
HCT: 34.6 % — ABNORMAL LOW (ref 39.0–52.0)
Hemoglobin: 11.9 g/dL — ABNORMAL LOW (ref 13.0–17.0)
MCH: 30.6 pg (ref 26.0–34.0)
MCHC: 34.4 g/dL (ref 30.0–36.0)
MCV: 88.9 fL (ref 80.0–100.0)
Platelets: 172 10*3/uL (ref 150–400)
RBC: 3.89 MIL/uL — ABNORMAL LOW (ref 4.22–5.81)
RDW: 14 % (ref 11.5–15.5)
WBC: 23.3 10*3/uL — ABNORMAL HIGH (ref 4.0–10.5)
nRBC: 0 % (ref 0.0–0.2)

## 2023-06-29 LAB — BASIC METABOLIC PANEL
Anion gap: 8 (ref 5–15)
BUN: 13 mg/dL (ref 8–23)
CO2: 22 mmol/L (ref 22–32)
Calcium: 8.1 mg/dL — ABNORMAL LOW (ref 8.9–10.3)
Chloride: 104 mmol/L (ref 98–111)
Creatinine, Ser: 1.04 mg/dL (ref 0.61–1.24)
GFR, Estimated: >60 mL/min (ref >60)
Glucose, Bld: 131 mg/dL — ABNORMAL HIGH (ref 70–99)
Potassium: 3.7 mmol/L (ref 3.5–5.1)
Sodium: 134 mmol/L — ABNORMAL LOW (ref 135–145)

## 2023-06-29 LAB — CULTURE, BLOOD (ROUTINE X 2)
Culture: NO GROWTH
Special Requests: ADEQUATE

## 2023-06-29 LAB — URINE CULTURE

## 2023-06-29 NOTE — Progress Notes (Signed)
Progress Note   Patient: Chris [name] DOB: 26-Sep-1946 DOA: 06/28/2023     1 DOS: the patient was seen and examined on 06/29/2023   Brief hospital course:   Chris [name] is a 77 y.o. male with medical history significant of hx of CVA, ADD, GERD, HLD, HTN, metabolic syndrome, hx of kidney stones, OSA on cpap who presented to ED after having a night of rigors then waking with dysuria, frequency and urgency and blood tinged urine found to have sepsis 2/2 UTI.   Assessment and Plan:  Sepsis secondary to UTI [facility]) Hx of kidney stones 77 year old male presenting with one day of rigors and dysuria, urgency and frequency with blood tinged urine found to be septic from a UTI. Remote hx of renal stones, last year 30+ years ago. No CVA tenderness on exam. Patient is responding appropriately to antibiotics and do not think further imaging is necessary at this time to evaluate for complication or associated renal stone.  -BC obtained (1 before abx an 1 after abx) -follow urine culture -continue rocephin  -trend lactic acid and CBC   Hyponatremia, improving Likely due to hypovolemia hyponatremia in setting of sepsis/poor PO intake s/p Fluid resuscitation in ED and continue maintenance IVF. -BMP daily   Normocytic anemia Likely dilutional. -CBC daily  History of stroke -Continue plavix/statin   GERD (gastroesophageal reflux disease) Continue daily pepcid   Hypertension -Continue micardis 80mg  daily   OSA on CPAP -Declines while in hospital   Hyperlipidemia -Continue lipitor daily       Subjective: Feels better than yesterday, less rigors.   Physical Exam: Vitals:   06/28/23 1623 06/28/23 1932 06/29/23 0439 06/29/23 1322  BP: (!) 149/67 135/64 135/64 121/61  Pulse: 88 77 78 67  Resp: 17 20 18 14   Temp: 98.7 F (37.1 C) 98.5 F (36.9 C) 99.6 F (37.6 C) 98.4 F (36.9 C)  TempSrc: Oral Oral  Oral  SpO2: 95% 99% 95% 97%  Weight:      Height:       Physical  Exam Vitals and nursing note reviewed.  Constitutional:      General: He is not in acute distress.    Appearance: He is not diaphoretic.  HENT:     Head: Atraumatic.  Eyes:     Pupils: Pupils are equal, round, and reactive to light.  Cardiovascular:     Rate and Rhythm: Normal rate and regular rhythm.     Pulses: Normal pulses.     Heart sounds: No murmur heard. Pulmonary:     Effort: Pulmonary effort is normal. No respiratory distress.     Breath sounds: Normal breath sounds. No rales.  Abdominal:     General: Abdomen is flat. There is no distension.     Palpations: Abdomen is soft.     Tenderness: There is no abdominal tenderness. There is no rebound.  Musculoskeletal:     Right lower leg: No edema.     Left lower leg: No edema.  Skin:    General: Skin is warm and dry.     Capillary Refill: Capillary refill takes less than 2 seconds.  Neurological:     Mental Status: He is alert and oriented to person, place, and time. Mental status is at baseline.  Psychiatric:        Mood and Affect: Mood normal.     Data Reviewed:     Latest Ref Rng & Units 06/29/2023    4:59 AM 06/28/2023  11:19 AM 10/20/2018    2:17 PM  CBC  WBC 4.0 - 10.5 K/uL 23.3  29.7    Hemoglobin 13.0 - 17.0 g/dL 47.8  29.5  62.1   Hematocrit 39.0 - 52.0 % 34.6  41.9  44.0   Platelets 150 - 400 K/uL 172  251        Latest Ref Rng & Units 06/29/2023    4:59 AM 06/28/2023   11:19 AM 10/20/2018    2:17 PM  CMP  Glucose 70 - 99 mg/dL 308  657  98   BUN 8 - 23 mg/dL 13  13  11    Creatinine 0.61 - 1.24 mg/dL 8.46  9.62  9.52   Sodium 135 - 145 mmol/L 134  132  141   Potassium 3.5 - 5.1 mmol/L 3.7  5.0  4.2   Chloride 98 - 111 mmol/L 104  98  105   CO2 22 - 32 mmol/L 22  26    Calcium 8.9 - 10.3 mg/dL 8.1  9.7    Total Protein 6.5 - 8.1 g/dL  7.6    Total Bilirubin 0.3 - 1.2 mg/dL  1.7    Alkaline Phos 38 - 126 U/L  84    AST 15 - 41 U/L  25    ALT 0 - 44 U/L  20       Family Communication: d/w wife  at bedside  Disposition: Status is: Inpatient Remains inpatient appropriate because: iv antibiotics  Planned Discharge Destination: Home    Time spent: 35 minutes  Author: Charolotte Eke, MD 06/29/2023 3:30 PM  For on call review [email or web address].

## 2023-06-30 DIAGNOSIS — A419 Sepsis, unspecified organism: Secondary | ICD-10-CM | POA: Diagnosis not present

## 2023-06-30 DIAGNOSIS — N390 Urinary tract infection, site not specified: Secondary | ICD-10-CM | POA: Diagnosis not present

## 2023-06-30 LAB — CBC
HCT: 35.5 % — ABNORMAL LOW (ref 39.0–52.0)
Hemoglobin: 12 g/dL — ABNORMAL LOW (ref 13.0–17.0)
MCH: 30.4 pg (ref 26.0–34.0)
MCHC: 33.8 g/dL (ref 30.0–36.0)
MCV: 89.9 fL (ref 80.0–100.0)
Platelets: 189 10*3/uL (ref 150–400)
RBC: 3.95 MIL/uL — ABNORMAL LOW (ref 4.22–5.81)
RDW: 13.9 % (ref 11.5–15.5)
WBC: 19.1 10*3/uL — ABNORMAL HIGH (ref 4.0–10.5)
nRBC: 0 % (ref 0.0–0.2)

## 2023-06-30 LAB — COMPREHENSIVE METABOLIC PANEL
ALT: 16 U/L (ref 0–44)
AST: 15 U/L (ref 15–41)
Albumin: 3.3 g/dL — ABNORMAL LOW (ref 3.5–5.0)
Alkaline Phosphatase: 66 U/L (ref 38–126)
Anion gap: 9 (ref 5–15)
BUN: 11 mg/dL (ref 8–23)
CO2: 23 mmol/L (ref 22–32)
Calcium: 8.1 mg/dL — ABNORMAL LOW (ref 8.9–10.3)
Chloride: 103 mmol/L (ref 98–111)
Creatinine, Ser: 1.01 mg/dL (ref 0.61–1.24)
GFR, Estimated: >60 mL/min (ref >60)
Glucose, Bld: 120 mg/dL — ABNORMAL HIGH (ref 70–99)
Potassium: 3.8 mmol/L (ref 3.5–5.1)
Sodium: 135 mmol/L (ref 135–145)
Total Bilirubin: 1.2 mg/dL (ref 0.3–1.2)
Total Protein: 6.1 g/dL — ABNORMAL LOW (ref 6.5–8.1)

## 2023-06-30 LAB — CULTURE, BLOOD (ROUTINE X 2)

## 2023-06-30 LAB — URINE CULTURE: Culture: >=100000 — AB

## 2023-06-30 LAB — MAGNESIUM: Magnesium: 2 mg/dL (ref 1.7–2.4)

## 2023-06-30 MED ORDER — SACCHAROMYCES BOULARDII 250 MG PO CAPS
250.0000 mg | ORAL_CAPSULE | Freq: Two times a day (BID) | ORAL | Status: DC
  Administered 2023-06-30 – 2023-07-01 (×2): 250 mg via ORAL
  Filled 2023-06-30 (×2): qty 1

## 2023-06-30 NOTE — Hospital Course (Addendum)
77 y.o. male with medical history significant of hx of CVA, ADD, GERD, HLD, HTN, metabolic syndrome, hx of kidney stones, OSA on cpap who presented to ED after having a night of rigors then waking with dysuria, frequency and urgency and blood tinged urine found to have sepsis 2/2 UTI.  Urine culture with E. coli, blood culture NGTD Initial lactic acidosis, leukocytosis significantly better

## 2023-06-30 NOTE — Progress Notes (Signed)
PROGRESS NOTE Chris [name]  WUJ:[id number] DOB: December 11, 1946 DOA: 06/28/2023 PCP: Cleatis Polka., MD  Brief Narrative/Hospital Course:  77 y.o. male with medical history significant of hx of CVA, ADD, GERD, HLD, HTN, metabolic syndrome, hx of kidney stones, OSA on cpap who presented to ED after having a night of rigors then waking with dysuria, frequency and urgency and blood tinged urine found to have sepsis 2/2 UTI.  Urine culture with E. coli, blood culture NGTD Initial lactic acidosis, leukocytosis significantly better    Subjective: Seen examined this morning overall doing much better alert awake vitals stable afebrile Wife at the bedside.   Assessment and Plan: Principal Problem:   Sepsis secondary to UTI [facility]) Active Problems:   Hyponatremia   Hyperlipidemia   OSA on CPAP   Hypertension   GERD (gastroesophageal reflux disease)   History of stroke   Assessment and Plan: Sepsis due to E. coli UTI Remote history of kidney stones: Urine culture with E. coli, blood culture NGTD, follow-up culture sensitivity, urine culture and continue ceftriaxone for now, lactic acidosis resolved, WBC normalizing, remains afebrile send BP stable.  Will keep another 24 hours of IV antibiotics anticipate discharge tomorrow on oral antibiotics Recent Labs  Lab 06/28/23 1119 06/28/23 1303 06/28/23 1927 06/29/23 0459 06/29/23 1254 06/30/23 0409  WBC 29.7*  --   --  23.3*  --  19.1*  LATICACIDVEN 1.3 2.9* 2.8*  --  1.8  --     Hyponatremia: Resolved.  Normocytic anemia: Likely dilutional, trend  History of stroke -Continue plavix/statin    GERD Continue daily pepcid    Hypertension -Continue micardis 80mg  daily    OSA on CPAP -Declines while in hospital    Hyperlipidemia -Continue lipitor daily  DVT prophylaxis: enoxaparin (LOVENOX) injection 40 mg Start: 06/28/23 2200 Code Status:   Code Status: Full Code Family Communication: plan of care discussed with patient/wife at  bedside. Patient status is: Inpatient because of UTI and sepsis Level of care: Progressive   Dispo: The patient is from: Home with family            Anticipated disposition: Home tomorrow Objective: Vitals last 24 hrs: Vitals:   06/29/23 0439 06/29/23 1322 06/29/23 2026 06/30/23 0434  BP: 135/64 121/61 135/75 135/66  Pulse: 78 67 76 67  Resp: 18 14 16  (!) 22  Temp: 99.6 F (37.6 C) 98.4 F (36.9 C) 99.4 F (37.4 C) 98.5 F (36.9 C)  TempSrc:  Oral Oral Oral  SpO2: 95% 97% 97% 97%  Weight:      Height:       Weight change:   Physical Examination: General exam: alert awake, older than stated age HEENT:Oral mucosa moist, Ear/Nose WNL grossly Respiratory system: bilaterally clear BS, no use of accessory muscle Cardiovascular system: S1 & S2 +, No JVD. Gastrointestinal system: Abdomen soft,NT,ND, BS+ Nervous System:Alert, awake, moving extremities. Extremities: LE edema neg,distal peripheral pulses palpable.  Skin: No rashes,no icterus. MSK: Normal muscle bulk,tone, power  Medications reviewed:  Scheduled Meds:  atorvastatin  40 mg Oral q1800   clopidogrel  75 mg Oral QHS   enoxaparin (LOVENOX) injection  40 mg Subcutaneous Q24H   famotidine  20 mg Oral Daily   irbesartan  300 mg Oral Daily   Continuous Infusions:  cefTRIAXone (ROCEPHIN)  IV 1 g (06/30/23 0926)      Diet Order             Diet heart healthy/carb modified Room service appropriate? Yes; Fluid  consistency: Thin  Diet effective now                            Intake/Output Summary (Last 24 hours) at 06/30/2023 1226 Last data filed at 06/30/2023 0915 Gross per 24 hour  Intake 700 ml  Output 950 ml  Net -250 ml   Net IO Since Admission: 2,889.59 mL [06/30/23 1226]  Wt Readings from Last 3 Encounters:  06/28/23 88 kg  11/06/22 90.4 kg  11/05/21 91.1 kg     Unresulted Labs (From admission, onward)    None     Data Reviewed: I have personally reviewed following labs and imaging  studies CBC: Recent Labs  Lab 06/28/23 1119 06/29/23 0459 06/30/23 0409  WBC 29.7* 23.3* 19.1*  NEUTROABS 26.2*  --   --   HGB 14.6 11.9* 12.0*  HCT 41.9 34.6* 35.5*  MCV 88.4 88.9 89.9  PLT 251 172 189   Basic Metabolic Panel: Recent Labs  Lab 06/28/23 1119 06/29/23 0459 06/30/23 0409  NA 132* 134* 135  K 5.0 3.7 3.8  CL 98 104 103  CO2 26 22 23   GLUCOSE 131* 131* 120*  BUN 13 13 11   CREATININE 1.19 1.04 1.01  CALCIUM 9.7 8.1* 8.1*  MG  --   --  2.0   GFR: Estimated Creatinine Clearance: 63.3 mL/min (by C-G formula based on SCr of 1.01 mg/dL). Liver Function Tests: Recent Labs  Lab 06/28/23 1119 06/30/23 0409  AST 25 15  ALT 20 16  ALKPHOS 84 66  BILITOT 1.7* 1.2  PROT 7.6 6.1*  ALBUMIN 4.8 3.3*   No results for input(s): "LIPASE", "AMYLASE" in the last 168 hours. No results for input(s): "AMMONIA" in the last 168 hours. Coagulation Profile: No results for input(s): "INR", "PROTIME" in the last 168 hours. BNP (last 3 results) No results for input(s): "PROBNP" in the last 8760 hours. HbA1C: No results for input(s): "HGBA1C" in the last 72 hours. CBG: No results for input(s): "GLUCAP" in the last 168 hours. Lipid Profile: No results for input(s): "CHOL", "HDL", "LDLCALC", "TRIG", "CHOLHDL", "LDLDIRECT" in the last 72 hours. Thyroid Function Tests: No results for input(s): "TSH", "T4TOTAL", "FREET4", "T3FREE", "THYROIDAB" in the last 72 hours. Sepsis Labs: Recent Labs  Lab 06/28/23 1119 06/28/23 1303 06/28/23 1927 06/29/23 1254  LATICACIDVEN 1.3 2.9* 2.8* 1.8    Recent Results (from the past 240 hour(s))  Urine Culture (for pregnant, neutropenic or urologic patients or patients with an indwelling urinary catheter)     Status: Abnormal   Collection Time: 06/28/23 10:52 AM   Specimen: Urine, Clean Catch  Result Value Ref Range Status   Specimen Description   Final    URINE, CLEAN CATCH Performed at [facility], [address] [place], [place] [address]    Special Requests   Final    NONE Performed at [facility], [address] [place], [place] [address]    Culture >=100,000 COLONIES/mL ESCHERICHIA COLI (A)  Final   Report Status 06/30/2023 FINAL  Final   Organism ID, Bacteria ESCHERICHIA COLI (A)  Final      Susceptibility   Escherichia coli - MIC*    AMPICILLIN <=2 SENSITIVE Sensitive     CEFAZOLIN <=4 SENSITIVE Sensitive     CEFEPIME <=0.12 SENSITIVE Sensitive     CEFTRIAXONE <=0.25 SENSITIVE Sensitive     CIPROFLOXACIN <=0.25 SENSITIVE Sensitive     GENTAMICIN <=1 SENSITIVE Sensitive  IMIPENEM <=0.25 SENSITIVE Sensitive     NITROFURANTOIN <=16 SENSITIVE Sensitive     TRIMETH/SULFA <=20 SENSITIVE Sensitive     AMPICILLIN/SULBACTAM <=2 SENSITIVE Sensitive     PIP/TAZO <=4 SENSITIVE Sensitive     * >=100,000 COLONIES/mL ESCHERICHIA COLI  Blood culture (routine x 2)     Status: None (Preliminary result)   Collection Time: 06/28/23 11:42 AM   Specimen: BLOOD RIGHT ARM  Result Value Ref Range Status   Specimen Description   Final    BLOOD RIGHT ARM Performed at [facility], [address], [place], [place] [address]    Special Requests   Final    BOTTLES DRAWN AEROBIC AND ANAEROBIC Blood Culture adequate volume Performed at [facility], [address] [place], [place] [address]    Culture   Final    NO GROWTH 2 DAYS Performed at [facility], [address] [place], [place] [address]    Report Status PENDING  Incomplete  Blood culture (routine x 2)     Status: None (Preliminary result)   Collection Time: 06/28/23  1:04 PM   Specimen: BLOOD RIGHT ARM  Result Value Ref Range Status   Specimen Description   Final    BLOOD RIGHT ARM Performed at [facility], [address], [place], [place] [address]    Special Requests   Final    BOTTLES DRAWN AEROBIC AND ANAEROBIC Blood Culture adequate volume Performed at [facility], [address], [place], [place] [address]    Culture   Final    NO GROWTH 2 DAYS Performed at [facility], [address], [place], [place] [address]    Report Status PENDING  Incomplete    Antimicrobials: Anti-infectives (From admission, onward)    Start     Dose/Rate Route Frequency Ordered Stop   06/29/23 1000  cefTRIAXone (ROCEPHIN) 1 g in sodium chloride 0.9 % 100 mL IVPB        1 g 200 mL/hr over 30 Minutes Intravenous Every 24 hours 06/28/23 1700     06/28/23 1100  cefTRIAXone (ROCEPHIN) 1 g in sodium chloride 0.9 % 100 mL IVPB        1 g 200 mL/hr over 30 Minutes Intravenous  Once 06/28/23 1052 06/28/23 1201      Culture/Microbiology    Component Value Date/Time   SDES  06/28/2023 1304    BLOOD RIGHT ARM Performed at [facility], [address], [place], [place] [address]    [facility]  06/28/2023 1304    BOTTLES DRAWN AEROBIC AND ANAEROBIC Blood Culture adequate volume Performed at [facility], [address] [place], [place] [address]    CULT  06/28/2023 1304    NO GROWTH 2 DAYS Performed at [facility], [address] [place], [place] [address]    REPTSTATUS PENDING 06/28/2023 1304    Other culture-see not  Radiology Studies: No results found.   LOS: 2 days   Lanae Boast, MD [facility]  06/30/2023, 12:26 PM

## 2023-06-30 NOTE — Plan of Care (Signed)
  Problem: Education: Goal: Knowledge of General Education information will improve Description: Including pain rating scale, medication(s)/side effects and non-pharmacologic comfort measures Outcome: Completed/Met   Problem: Clinical Measurements: Goal: Ability to maintain clinical measurements within normal limits will improve Outcome: Completed/Met Goal: Diagnostic test results will improve Outcome: Completed/Met Goal: Respiratory complications will improve Outcome: Completed/Met Goal: Cardiovascular complication will be avoided Outcome: Completed/Met   Problem: Activity: Goal: Risk for activity intolerance will decrease Outcome: Completed/Met   Problem: Nutrition: Goal: Adequate nutrition will be maintained Outcome: Completed/Met   Problem: Coping: Goal: Level of anxiety will decrease Outcome: Completed/Met   Problem: Elimination: Goal: Will not experience complications related to urinary retention Outcome: Completed/Met   Problem: Pain Managment: Goal: General experience of comfort will improve Outcome: Completed/Met   Problem: Safety: Goal: Ability to remain free from injury will improve Outcome: Completed/Met   Problem: Skin Integrity: Goal: Risk for impaired skin integrity will decrease Outcome: Completed/Met

## 2023-06-30 NOTE — Progress Notes (Signed)
Pt reports multiple small liquid stools.  Notified MD and orders received.  Placed on enteric precautions. Chris [name]

## 2023-06-30 NOTE — TOC CM/SW Note (Signed)
Transition of Care [facility]) - Inpatient Brief Assessment   Patient Details  Name: Chris [name] MRN: [id number] Date of Birth: 03-01-1946  Transition of Care [facility]) CM/SW Contact:    Howell Rucks, RN Phone Number: 06/30/2023, 12:06 PM   Clinical Narrative: Met with pt at bedside to introduce role of TOC/NCM and review for dc planning. Pt reports he has PCP and pharmacy in place, no home care services or home DME, has transportation in place for dc. Pt had no questions/concerns. TOC Brief Assessment completed. No TOC needs identified.     Transition of Care Asessment: Insurance and Status: Insurance coverage has been reviewed Patient has primary care physician: Yes Home environment has been reviewed: private residence with spouse Prior level of function:: Independent Prior/Current Home Services: No current home services Social Determinants of Health Reivew: SDOH reviewed no interventions necessary Readmission risk has been reviewed: Yes Transition of care needs: no transition of care needs at this time

## 2023-07-01 ENCOUNTER — Other Ambulatory Visit (HOSPITAL_COMMUNITY): Payer: Self-pay

## 2023-07-01 DIAGNOSIS — N390 Urinary tract infection, site not specified: Secondary | ICD-10-CM | POA: Diagnosis not present

## 2023-07-01 DIAGNOSIS — A419 Sepsis, unspecified organism: Secondary | ICD-10-CM | POA: Diagnosis not present

## 2023-07-01 MED ORDER — CEFADROXIL 500 MG PO CAPS
500.0000 mg | ORAL_CAPSULE | Freq: Two times a day (BID) | ORAL | 0 refills | Status: AC
  Filled 2023-07-01: qty 14, 7d supply, fill #0

## 2023-07-01 MED ORDER — SACCHAROMYCES BOULARDII 250 MG PO CAPS
250.0000 mg | ORAL_CAPSULE | Freq: Two times a day (BID) | ORAL | 0 refills | Status: AC
  Filled 2023-07-01: qty 20, 10d supply, fill #0

## 2023-07-01 NOTE — Discharge Summary (Signed)
Physician Discharge Summary  Chris [name] YQM:[id number] DOB: Jan 15, 1946 DOA: 06/28/2023  PCP: Cleatis Polka., MD  Admit date: 06/28/2023 Discharge date: 07/01/2023 Recommendations for Outpatient Follow-up:  Follow up with PCP in 1 weeks-call for appointment Please obtain BMP/CBC in one week  Discharge Dispo: Home Discharge Condition: Stable Code Status:   Code Status: Full Code Diet recommendation:  Diet Order             Diet heart healthy/carb modified Room service appropriate? Yes; Fluid consistency: Thin  Diet effective now                    Brief/Interim Summary:  77 y.o. male with medical history significant of hx of CVA, ADD, GERD, HLD, HTN, metabolic syndrome, hx of kidney stones, OSA on cpap who presented to ED after having a night of rigors then waking with dysuria, frequency and urgency and blood tinged urine found to have sepsis 2/2 UTI.  Urine culture with E. coli, blood culture NGTD Initial lactic acidosis, leukocytosis significantly better Clinically feels improved no more dysuria fever chills.  Urine culture with pansensitive E. coli discussed with pharmacy will discharge on Doxy along with Florastor.  He did have episodes of diarrhea 7/7 but resolved and no diarrhea overnight.  Tolerating diet well and feels comfortable going home.  Advised to follow-up with PCP within a week and recheck CBC       Discharge Diagnoses:  Principal Problem:   Sepsis secondary to UTI [facility]) Active Problems:   Hyponatremia   Hyperlipidemia   OSA on CPAP   Hypertension   GERD (gastroesophageal reflux disease)   History of stroke  Sepsis due to E. coli UTI Remote history of kidney stones: Urine culture with E. coli, blood culture NGTD-it is pansensitive, discharged on oral cefadroxil.  Leukocytosis downtrending advised to follow-up with PCP to recheck CBC, continue Florastor.  Had episodes of diarrhea 7/8 but no recurrence tolerating diet noted. Recent Labs  Lab  06/28/23 1119 06/28/23 1303 06/28/23 1927 06/29/23 0459 06/29/23 1254 06/30/23 0409  WBC 29.7*  --   --  23.3*  --  19.1*  LATICACIDVEN 1.3 2.9* 2.8*  --  1.8  --     Hyponatremia: Resolved.  Normocytic anemia: Likely dilutional, trend as outpatient  History of stroke -Continue plavix/statin    GERD Continue daily pepcid    Hypertension -Continue micardis 80mg  daily    OSA on CPAP -Declines while in hospital    Hyperlipidemia -Continue lipitor daily   Consults: none Subjective: Alert awake oriented no abdominal pain no diarrhea no fever or chills.  He feels ready for discharge home today   Discharge Exam: Vitals:   06/30/23 2014 07/01/23 0425  BP: (!) 144/69 134/70  Pulse: 73 69  Resp: 20 16  Temp: (!) 97.4 F (36.3 C) 98.6 F (37 C)  SpO2: 98% 95%   General: Pt is alert, awake, not in acute distress Cardiovascular: RRR, S1/S2 +, no rubs, no gallops Respiratory: CTA bilaterally, no wheezing, no rhonchi Abdominal: Soft, NT, ND, bowel sounds + Extremities: no edema, no cyanosis  Discharge Instructions  Discharge Instructions     Discharge instructions   Complete by: As directed    Please call call MD or return to ER for similar or worsening recurring problem that brought you to hospital or if any fever,nausea/vomiting,abdominal pain, uncontrolled pain, chest pain,  shortness of breath or any other alarming symptoms.  Please follow-up your doctor as instructed in a  week time and call the office for appointment.  Please check CBC from your doctor in a week  Please avoid alcohol, smoking, or any other illicit substance and maintain healthy habits including taking your regular medications as prescribed.  You were cared for by a hospitalist during your hospital stay. If you have any questions about your discharge medications or the care you received while you were in the hospital after you are discharged, you can call the unit and ask to speak with the  hospitalist on call if the hospitalist that took care of you is not available.  Once you are discharged, your primary care physician will handle any further medical issues. Please note that NO REFILLS for any discharge medications will be authorized once you are discharged, as it is imperative that you return to your primary care physician (or establish a relationship with a primary care physician if you do not have one) for your aftercare needs so that they can reassess your need for medications and monitor your lab values   Increase activity slowly   Complete by: As directed       Allergies as of 07/01/2023       Reactions   Morphine And Codeine Nausea And Vomiting        Medication List     TAKE these medications    atorvastatin 40 MG tablet Commonly known as: LIPITOR Take 40 mg by mouth daily at 6 PM.   cefadroxil 500 MG capsule Commonly known as: DURICEF Take 1 capsule (500 mg total) by mouth 2 (two) times daily for 7 days.   clopidogrel 75 MG tablet Commonly known as: PLAVIX Take 1 tablet (75 mg total) by mouth daily. What changed: when to take this   famotidine 20 MG tablet Commonly known as: PEPCID Take 20 mg by mouth daily.   fluticasone 50 MCG/ACT nasal spray Commonly known as: FLONASE Place 1 spray into both nostrils daily as needed for allergies.   ibuprofen 200 MG tablet Commonly known as: ADVIL Take 400 mg by mouth every 6 (six) hours as needed.   saccharomyces boulardii 250 MG capsule Commonly known as: FLORASTOR Take 1 capsule (250 mg total) by mouth 2 (two) times daily for 14 days.   telmisartan 80 MG tablet Commonly known as: MICARDIS Take 80 mg by mouth at bedtime.        Follow-up Information     Cleatis Polka., MD Follow up in 1 week(s).   Specialty: Internal Medicine Contact information: [address] [place] [address] [phone]                Allergies  Allergen Reactions   Morphine And Codeine Nausea And  Vomiting    The results of significant diagnostics from this hospitalization (including imaging, microbiology, ancillary and laboratory) are listed below for reference.    Microbiology: Recent Results (from the past 240 hour(s))  Urine Culture (for pregnant, neutropenic or urologic patients or patients with an indwelling urinary catheter)     Status: Abnormal   Collection Time: 06/28/23 10:52 AM   Specimen: Urine, Clean Catch  Result Value Ref Range Status   Specimen Description   Final    URINE, CLEAN CATCH Performed at [facility], [address] [place], [place] [address]    Special Requests   Final    NONE Performed at [facility], [address], [place], [place] [address]    Culture >=100,000 COLONIES/mL ESCHERICHIA COLI (A)  Final  Report Status 06/30/2023 FINAL  Final   Organism ID, Bacteria ESCHERICHIA COLI (A)  Final      Susceptibility   Escherichia coli - MIC*    AMPICILLIN <=2 SENSITIVE Sensitive     CEFAZOLIN <=4 SENSITIVE Sensitive     CEFEPIME <=0.12 SENSITIVE Sensitive     CEFTRIAXONE <=0.25 SENSITIVE Sensitive     CIPROFLOXACIN <=0.25 SENSITIVE Sensitive     GENTAMICIN <=1 SENSITIVE Sensitive     IMIPENEM <=0.25 SENSITIVE Sensitive     NITROFURANTOIN <=16 SENSITIVE Sensitive     TRIMETH/SULFA <=20 SENSITIVE Sensitive     AMPICILLIN/SULBACTAM <=2 SENSITIVE Sensitive     PIP/TAZO <=4 SENSITIVE Sensitive     * >=100,000 COLONIES/mL ESCHERICHIA COLI  Blood culture (routine x 2)     Status: None (Preliminary result)   Collection Time: 06/28/23 11:42 AM   Specimen: BLOOD RIGHT ARM  Result Value Ref Range Status   Specimen Description   Final    BLOOD RIGHT ARM Performed at [facility], [address], [place], [place] [address]    Special Requests   Final    BOTTLES DRAWN AEROBIC AND ANAEROBIC Blood Culture adequate volume Performed at [facility], [address], [place], [place]  [address]    Culture   Final    NO GROWTH 3 DAYS Performed at [facility], [address], [place], [place] [address]    Report Status PENDING  Incomplete  Blood culture (routine x 2)     Status: None (Preliminary result)   Collection Time: 06/28/23  1:04 PM   Specimen: BLOOD RIGHT ARM  Result Value Ref Range Status   Specimen Description   Final    BLOOD RIGHT ARM Performed at [facility], [address] [place], [place] [address]    Special Requests   Final    BOTTLES DRAWN AEROBIC AND ANAEROBIC Blood Culture adequate volume Performed at [facility], [address] [place], [place] [address]    Culture   Final    NO GROWTH 3 DAYS Performed at [facility], [address], [place], [place] [address]    Report Status PENDING  Incomplete    Procedures/Studies: No results found.  Labs: BNP (last 3 results) No results for input(s): "BNP" in the last 8760 hours. Basic Metabolic Panel: Recent Labs  Lab 06/28/23 1119 06/29/23 0459 06/30/23 0409  NA 132* 134* 135  K 5.0 3.7 3.8  CL 98 104 103  CO2 26 22 23   GLUCOSE 131* 131* 120*  BUN 13 13 11   CREATININE 1.19 1.04 1.01  CALCIUM 9.7 8.1* 8.1*  MG  --   --  2.0   Liver Function Tests: Recent Labs  Lab 06/28/23 1119 06/30/23 0409  AST 25 15  ALT 20 16  ALKPHOS 84 66  BILITOT 1.7* 1.2  PROT 7.6 6.1*  ALBUMIN 4.8 3.3*   No results for input(s): "LIPASE", "AMYLASE" in the last 168 hours. No results for input(s): "AMMONIA" in the last 168 hours. CBC: Recent Labs  Lab 06/28/23 1119 06/29/23 0459 06/30/23 0409  WBC 29.7* 23.3* 19.1*  NEUTROABS 26.2*  --   --   HGB 14.6 11.9* 12.0*  HCT 41.9 34.6* 35.5*  MCV 88.4 88.9 89.9  PLT 251 172 189   Cardiac Enzymes: No results for input(s): "CKTOTAL", "CKMB", "CKMBINDEX", "TROPONINI" in the last 168 hours. BNP: Invalid input(s): "POCBNP" CBG: No results for input(s): "GLUCAP" in the last 168 hours.  D-Dimer No results for  input(s): "DDIMER" in the last 72 hours. Hgb A1c No results for input(s): "HGBA1C" in the last 72 hours. Lipid Profile No results for input(s): "CHOL", "HDL", "LDLCALC", "TRIG", "CHOLHDL", "LDLDIRECT" in the last 72 hours. Thyroid function studies No results for input(s): "TSH", "T4TOTAL", "T3FREE", "THYROIDAB" in the last 72 hours.  Invalid input(s): "FREET3" Anemia work up No results for input(s): "VITAMINB12", "FOLATE", "FERRITIN", "TIBC", "IRON", "RETICCTPCT" in the last 72 hours. Urinalysis    Component Value Date/Time   COLORURINE YELLOW 06/28/2023 0945   APPEARANCEUR CLOUDY (A) 06/28/2023 0945   LABSPEC 1.024 06/28/2023 0945   PHURINE 6.0 06/28/2023 0945   GLUCOSEU NEGATIVE 06/28/2023 0945   HGBUR LARGE (A) 06/28/2023 0945   BILIRUBINUR NEGATIVE 06/28/2023 0945   KETONESUR NEGATIVE 06/28/2023 0945   PROTEINUR 30 (A) 06/28/2023 0945   NITRITE NEGATIVE 06/28/2023 0945   LEUKOCYTESUR LARGE (A) 06/28/2023 0945   Sepsis Labs Recent Labs  Lab 06/28/23 1119 06/29/23 0459 06/30/23 0409  WBC 29.7* 23.3* 19.1*   Microbiology Recent Results (from the past 240 hour(s))  Urine Culture (for pregnant, neutropenic or urologic patients or patients with an indwelling urinary catheter)     Status: Abnormal   Collection Time: 06/28/23 10:52 AM   Specimen: Urine, Clean Catch  Result Value Ref Range Status   Specimen Description   Final    URINE, CLEAN CATCH Performed at [facility], [address] [place], [place] [address]    Special Requests   Final    NONE Performed at [facility], [address] [place], [place] [address]    Culture >=100,000 COLONIES/mL ESCHERICHIA COLI (A)  Final   Report Status 06/30/2023 FINAL  Final   Organism ID, Bacteria ESCHERICHIA COLI (A)  Final      Susceptibility   Escherichia coli - MIC*    AMPICILLIN <=2 SENSITIVE Sensitive     CEFAZOLIN <=4 SENSITIVE Sensitive     CEFEPIME <=0.12 SENSITIVE  Sensitive     CEFTRIAXONE <=0.25 SENSITIVE Sensitive     CIPROFLOXACIN <=0.25 SENSITIVE Sensitive     GENTAMICIN <=1 SENSITIVE Sensitive     IMIPENEM <=0.25 SENSITIVE Sensitive     NITROFURANTOIN <=16 SENSITIVE Sensitive     TRIMETH/SULFA <=20 SENSITIVE Sensitive     AMPICILLIN/SULBACTAM <=2 SENSITIVE Sensitive     PIP/TAZO <=4 SENSITIVE Sensitive     * >=100,000 COLONIES/mL ESCHERICHIA COLI  Blood culture (routine x 2)     Status: None (Preliminary result)   Collection Time: 06/28/23 11:42 AM   Specimen: BLOOD RIGHT ARM  Result Value Ref Range Status   Specimen Description   Final    BLOOD RIGHT ARM Performed at [facility], [address] [place], [place] [address]    Special Requests   Final    BOTTLES DRAWN AEROBIC AND ANAEROBIC Blood Culture adequate volume Performed at [facility], [address], [place], [place] [address]    Culture   Final    NO GROWTH 3 DAYS Performed at [facility], [address], [place], [place] [address]    Report Status PENDING  Incomplete  Blood culture (routine x 2)     Status: None (Preliminary result)   Collection Time: 06/28/23  1:04 PM   Specimen: BLOOD RIGHT ARM  Result Value Ref Range Status   Specimen Description   Final    BLOOD RIGHT ARM Performed at [facility], [address], [place], [place] [address]  Special Requests   Final    BOTTLES DRAWN AEROBIC AND ANAEROBIC Blood Culture adequate volume Performed at [facility], [address] [place], [place] [address]    Culture   Final    NO GROWTH 3 DAYS Performed at [facility], [address], [place], [place] [address]    Report Status PENDING  Incomplete     Time coordinating discharge: 25 minutes  SIGNED: Lanae Boast, MD  [facility] 07/01/2023, 10:15 AM  If 7PM-7AM, please contact night-coverage [email or web address]

## 2023-07-01 NOTE — Plan of Care (Signed)
  Problem: Health Behavior/Discharge Planning: Goal: Ability to manage health-related needs will improve Outcome: Adequate for Discharge   

## 2023-07-01 NOTE — Progress Notes (Signed)
Reviewed discharge instructions with pt and family including medications and follow up appointments. Verbalized understanding. Darcia Lampi, Yancey Flemings, RN

## 2023-07-03 DIAGNOSIS — A419 Sepsis, unspecified organism: Secondary | ICD-10-CM | POA: Diagnosis not present

## 2023-07-03 DIAGNOSIS — N390 Urinary tract infection, site not specified: Secondary | ICD-10-CM | POA: Diagnosis not present

## 2023-07-03 DIAGNOSIS — E872 Acidosis, unspecified: Secondary | ICD-10-CM | POA: Diagnosis not present

## 2023-07-03 DIAGNOSIS — R310 Gross hematuria: Secondary | ICD-10-CM | POA: Diagnosis not present

## 2023-07-16 DIAGNOSIS — L820 Inflamed seborrheic keratosis: Secondary | ICD-10-CM | POA: Diagnosis not present

## 2023-07-16 DIAGNOSIS — G4733 Obstructive sleep apnea (adult) (pediatric): Secondary | ICD-10-CM | POA: Diagnosis not present

## 2023-07-16 DIAGNOSIS — D225 Melanocytic nevi of trunk: Secondary | ICD-10-CM | POA: Diagnosis not present

## 2023-07-16 DIAGNOSIS — D485 Neoplasm of uncertain behavior of skin: Secondary | ICD-10-CM | POA: Diagnosis not present

## 2023-07-16 DIAGNOSIS — S80861A Insect bite (nonvenomous), right lower leg, initial encounter: Secondary | ICD-10-CM | POA: Diagnosis not present

## 2023-07-16 DIAGNOSIS — Z1283 Encounter for screening for malignant neoplasm of skin: Secondary | ICD-10-CM | POA: Diagnosis not present

## 2023-07-16 DIAGNOSIS — B078 Other viral warts: Secondary | ICD-10-CM | POA: Diagnosis not present

## 2023-07-21 DIAGNOSIS — K5900 Constipation, unspecified: Secondary | ICD-10-CM | POA: Diagnosis not present

## 2023-07-21 DIAGNOSIS — A419 Sepsis, unspecified organism: Secondary | ICD-10-CM | POA: Diagnosis not present

## 2023-07-21 DIAGNOSIS — R300 Dysuria: Secondary | ICD-10-CM | POA: Diagnosis not present

## 2023-07-21 DIAGNOSIS — N390 Urinary tract infection, site not specified: Secondary | ICD-10-CM | POA: Diagnosis not present

## 2023-08-06 DIAGNOSIS — E781 Pure hyperglyceridemia: Secondary | ICD-10-CM | POA: Diagnosis not present

## 2023-08-06 DIAGNOSIS — I10 Essential (primary) hypertension: Secondary | ICD-10-CM | POA: Diagnosis not present

## 2023-08-06 DIAGNOSIS — E785 Hyperlipidemia, unspecified: Secondary | ICD-10-CM | POA: Diagnosis not present

## 2023-08-06 DIAGNOSIS — R310 Gross hematuria: Secondary | ICD-10-CM | POA: Diagnosis not present

## 2023-08-06 DIAGNOSIS — R7301 Impaired fasting glucose: Secondary | ICD-10-CM | POA: Diagnosis not present

## 2023-08-11 DIAGNOSIS — I10 Essential (primary) hypertension: Secondary | ICD-10-CM | POA: Diagnosis not present

## 2023-08-11 DIAGNOSIS — R82998 Other abnormal findings in urine: Secondary | ICD-10-CM | POA: Diagnosis not present

## 2023-08-13 DIAGNOSIS — Z1331 Encounter for screening for depression: Secondary | ICD-10-CM | POA: Diagnosis not present

## 2023-08-13 DIAGNOSIS — E785 Hyperlipidemia, unspecified: Secondary | ICD-10-CM | POA: Diagnosis not present

## 2023-08-13 DIAGNOSIS — E8881 Metabolic syndrome: Secondary | ICD-10-CM | POA: Diagnosis not present

## 2023-08-13 DIAGNOSIS — R972 Elevated prostate specific antigen [PSA]: Secondary | ICD-10-CM | POA: Diagnosis not present

## 2023-08-13 DIAGNOSIS — G629 Polyneuropathy, unspecified: Secondary | ICD-10-CM | POA: Diagnosis not present

## 2023-08-13 DIAGNOSIS — G4733 Obstructive sleep apnea (adult) (pediatric): Secondary | ICD-10-CM | POA: Diagnosis not present

## 2023-08-13 DIAGNOSIS — I10 Essential (primary) hypertension: Secondary | ICD-10-CM | POA: Diagnosis not present

## 2023-08-13 DIAGNOSIS — R7301 Impaired fasting glucose: Secondary | ICD-10-CM | POA: Diagnosis not present

## 2023-08-13 DIAGNOSIS — Z8619 Personal history of other infectious and parasitic diseases: Secondary | ICD-10-CM | POA: Diagnosis not present

## 2023-08-13 DIAGNOSIS — Z23 Encounter for immunization: Secondary | ICD-10-CM | POA: Diagnosis not present

## 2023-08-13 DIAGNOSIS — Z8673 Personal history of transient ischemic attack (TIA), and cerebral infarction without residual deficits: Secondary | ICD-10-CM | POA: Diagnosis not present

## 2023-08-13 DIAGNOSIS — Z1339 Encounter for screening examination for other mental health and behavioral disorders: Secondary | ICD-10-CM | POA: Diagnosis not present

## 2023-08-13 DIAGNOSIS — Z0000 Encounter for general adult medical examination without abnormal findings: Secondary | ICD-10-CM | POA: Diagnosis not present

## 2023-08-16 DIAGNOSIS — G4733 Obstructive sleep apnea (adult) (pediatric): Secondary | ICD-10-CM | POA: Diagnosis not present

## 2023-09-16 DIAGNOSIS — G4733 Obstructive sleep apnea (adult) (pediatric): Secondary | ICD-10-CM | POA: Diagnosis not present

## 2023-10-04 DIAGNOSIS — Z23 Encounter for immunization: Secondary | ICD-10-CM | POA: Diagnosis not present

## 2023-10-22 DIAGNOSIS — H52203 Unspecified astigmatism, bilateral: Secondary | ICD-10-CM | POA: Diagnosis not present

## 2023-10-22 DIAGNOSIS — Z961 Presence of intraocular lens: Secondary | ICD-10-CM | POA: Diagnosis not present

## 2023-10-22 DIAGNOSIS — H532 Diplopia: Secondary | ICD-10-CM | POA: Diagnosis not present

## 2023-11-06 NOTE — Progress Notes (Signed)
PATIENT: Chris [name] DOB: [birth year]  REASON FOR VISIT: follow up HISTORY FROM: patient PRIMARY NEUROLOGIST: Dr. Frances Furbish  Chief Complaint  Patient presents with   Follow-up    Pt in 4 Pt here for cpap f/u Pt states hospital stay July 2024 due sepsis      HISTORY OF PRESENT ILLNESS: Today 11/10/23:  Chris [name] is a 77 y.o. male with a history of OSA on CPAP. Returns today for follow-up.  Reports that CPAP is working well.  States occasionally his hose will pop off but otherwise doing okay.  Download is below       11/06/22: Chris [name] is a 77 year old male with a history of obstructive sleep apnea on CPAP.  He returns today for follow-up.  His download indicates that he uses machine nightly for compliance of 100%.  He uses machine greater than 4 hours each night.  On average he uses his machine 8 hours and 42 minutes.  His residual AHI is 10.4 on 8 cm of water with EPR 2.  Reports that sometimes he feels the mask leaking but is not bothersome to him.  11/05/21: Chris [name] is a 77 year old male with a history of obstructive sleep apnea on CPAP.  He returns today for follow-up.  He reports that the CPAP continues to work fairly well for him.  He does state since he got this new machine he feels that he is getting too much air.  Also feels that at night his stomach feels up with air.  He denies any other issues.  He returns today for an evaluation.    HISTORY 10/30/20:   Chris [name] is a 77 year old male with a history of obstructive sleep apnea on CPAP.  His download is from July 15 August 13.  He uses machine nightly for compliance of 100%.  He uses machine greater than 4 hours each night.  On average he uses his machine 8 hours and 29 minutes.  His residual AHI is 4.5 on 9 cm of water.  He states that since the recall he has been back to using his old machine.  He notes that when he was using his new machine he developed a deep cough.  When he switched to his old machine the cough  seemed to improve.  He also noticed black particles floating in his new machine.  He returns today for follow-up.     REVIEW OF SYSTEMS: Out of a complete 14 system review of symptoms, the patient complains only of the following symptoms, and all other reviewed systems are negative.   ESS 8  ALLERGIES: Allergies  Allergen Reactions   Morphine And Codeine Nausea And Vomiting    HOME MEDICATIONS: Outpatient Medications Prior to Visit  Medication Sig Dispense Refill   atorvastatin (LIPITOR) 40 MG tablet Take 40 mg by mouth daily at 6 PM.      clopidogrel (PLAVIX) 75 MG tablet Take 1 tablet (75 mg total) by mouth daily. (Patient taking differently: Take 75 mg by mouth at bedtime.) 30 tablet 0   famotidine (PEPCID) 20 MG tablet Take 20 mg by mouth daily.     fluticasone (FLONASE) 50 MCG/ACT nasal spray Place 1 spray into both nostrils daily as needed for allergies.      ibuprofen (ADVIL,MOTRIN) 200 MG tablet Take 400 mg by mouth every 6 (six) hours as needed.     Probiotic Product (ALIGN PO) Take by mouth.     telmisartan (MICARDIS) 80 MG tablet Take  80 mg by mouth at bedtime.      No facility-administered medications prior to visit.    PAST MEDICAL HISTORY: Past Medical History:  Diagnosis Date   Acute CVA (cerebrovascular accident) (HCC) 03/25/2018   "double vision and balance issues" (03/26/2018)   ADD (attention deficit disorder)    Arthritis    "thumbs and fingers" (03/26/2018)   Diverticulosis    GERD (gastroesophageal reflux disease)    Heart murmur    "when I was young; not noted since age 81" (03/26/2018)   History of kidney stones    Hyperlipidemia    Hypertension    IFG (impaired fasting glucose)    Metabolic syndrome    OSA on CPAP    Pneumonia    "a time or 2" (03/26/2018)   PONV (postoperative nausea and vomiting)    Shingles ~ 2002   TIA (transient ischemic attack) 2013    PAST SURGICAL HISTORY: Past Surgical History:  Procedure Laterality Date   CARPAL  TUNNEL RELEASE Right    CYSTOSCOPY W/ STONE MANIPULATION     FINGER SURGERY Left    thumb surgery; "took bone out; put tendon in"   TONSILLECTOMY AND ADENOIDECTOMY      FAMILY HISTORY: Family History  Problem Relation Age of Onset   Prostate cancer Father    GER disease Sister    Irritable bowel syndrome Sister    Colon cancer Neg Hx    Esophageal cancer Neg Hx    Liver cancer Neg Hx    Rectal cancer Neg Hx    Sleep apnea Neg Hx     SOCIAL HISTORY: Social History   Socioeconomic History   Marital status: Married    Spouse name: Not on file   Number of children: Not on file   Years of education: Not on file   Highest education level: Not on file  Occupational History   Not on file  Tobacco Use   Smoking status: Never   Smokeless tobacco: Never  Vaping Use   Vaping status: Never Used  Substance and Sexual Activity   Alcohol use: No   Drug use: Never   Sexual activity: Not on file  Other Topics Concern   Not on file  Social History Narrative   Not on file   Social Determinants of Health   Financial Resource Strain: Not on file  Food Insecurity: No Food Insecurity (06/28/2023)   Hunger Vital Sign    Worried About Running Out of Food in the Last Year: Never true    Ran Out of Food in the Last Year: Never true  Transportation Needs: No Transportation Needs (06/28/2023)   PRAPARE - Administrator, Civil Service (Medical): No    Lack of Transportation (Non-Medical): No  Physical Activity: Not on file  Stress: Not on file  Social Connections: Not on file  Intimate Partner Violence: Not At Risk (06/28/2023)   Humiliation, Afraid, Rape, and Kick questionnaire    Fear of Current or Ex-Partner: No    Emotionally Abused: No    Physically Abused: No    Sexually Abused: No      PHYSICAL EXAM  Vitals:   11/10/23 1033  BP: 124/71  Pulse: 82  Weight: 195 lb (88.5 kg)  Height: 5\' 11"  (1.803 m)   Body mass index is 27.2 kg/m.  Generalized: Well  developed, in no acute distress  Chest: Lungs clear to auscultation bilaterally  Neurological examination  Mentation: Alert oriented to time, place, history taking.  Follows all commands speech and language fluent Cranial nerve II-XII: Extraocular movements were full, visual field were full on confrontational test Head turning and shoulder shrug  were normal and symmetric. Gait and station: Gait is normal.    DIAGNOSTIC DATA (LABS, IMAGING, TESTING) - I reviewed patient records, labs, notes, testing and imaging myself where available.  Lab Results  Component Value Date   WBC 19.1 (H) 06/30/2023   HGB 12.0 (L) 06/30/2023   HCT 35.5 (L) 06/30/2023   MCV 89.9 06/30/2023   PLT 189 06/30/2023      Component Value Date/Time   NA 135 06/30/2023 0409   K 3.8 06/30/2023 0409   CL 103 06/30/2023 0409   CO2 23 06/30/2023 0409   GLUCOSE 120 (H) 06/30/2023 0409   BUN 11 06/30/2023 0409   CREATININE 1.01 06/30/2023 0409   CALCIUM 8.1 (L) 06/30/2023 0409   PROT 6.1 (L) 06/30/2023 0409   ALBUMIN 3.3 (L) 06/30/2023 0409   AST 15 06/30/2023 0409   ALT 16 06/30/2023 0409   ALKPHOS 66 06/30/2023 0409   BILITOT 1.2 06/30/2023 0409   GFRNONAA >60 06/30/2023 0409   GFRAA >60 10/20/2018 1406   Lab Results  Component Value Date   CHOL 96 03/27/2018   HDL 33 (L) 03/27/2018   LDLCALC 45 03/27/2018   TRIG 92 03/27/2018   CHOLHDL 2.9 03/27/2018   Lab Results  Component Value Date   HGBA1C 6.1 (H) 03/27/2018     ASSESSMENT AND PLAN 77 y.o. year old male  has a past medical history of Acute CVA (cerebrovascular accident) (HCC) (03/25/2018), ADD (attention deficit disorder), Arthritis, Diverticulosis, GERD (gastroesophageal reflux disease), Heart murmur, History of kidney stones, Hyperlipidemia, Hypertension, IFG (impaired fasting glucose), Metabolic syndrome, OSA on CPAP, Pneumonia, PONV (postoperative nausea and vomiting), Shingles (~ 2002), and TIA (transient ischemic attack) (2013). here  with:  OSA on CPAP  - CPAP compliance excellent -Residual AHI in normal range - Encourage patient to use CPAP nightly and > 4 hours each night - F/U in 1 year or sooner if needed    Butch Penny, MSN, NP-C 11/10/2023, 10:36 AM [facility] [address] [place], [place] [address] [phone]

## 2023-11-10 ENCOUNTER — Encounter: Payer: Self-pay | Admitting: Adult Health

## 2023-11-10 ENCOUNTER — Ambulatory Visit: Payer: Medicare HMO | Admitting: Adult Health

## 2023-11-10 VITALS — BP 124/71 | HR 82 | Ht 71.0 in | Wt 195.0 lb

## 2023-11-10 DIAGNOSIS — G4733 Obstructive sleep apnea (adult) (pediatric): Secondary | ICD-10-CM | POA: Diagnosis not present

## 2023-11-10 NOTE — Patient Instructions (Signed)
Continue using CPAP nightly and greater than 4 hours each night °If your symptoms worsen or you develop new symptoms please let us know.  ° °

## 2023-11-24 DIAGNOSIS — Z125 Encounter for screening for malignant neoplasm of prostate: Secondary | ICD-10-CM | POA: Diagnosis not present

## 2023-12-02 DIAGNOSIS — D2271 Melanocytic nevi of right lower limb, including hip: Secondary | ICD-10-CM | POA: Diagnosis not present

## 2023-12-02 DIAGNOSIS — Z1283 Encounter for screening for malignant neoplasm of skin: Secondary | ICD-10-CM | POA: Diagnosis not present

## 2023-12-02 DIAGNOSIS — X32XXXD Exposure to sunlight, subsequent encounter: Secondary | ICD-10-CM | POA: Diagnosis not present

## 2023-12-02 DIAGNOSIS — D225 Melanocytic nevi of trunk: Secondary | ICD-10-CM | POA: Diagnosis not present

## 2023-12-02 DIAGNOSIS — D485 Neoplasm of uncertain behavior of skin: Secondary | ICD-10-CM | POA: Diagnosis not present

## 2023-12-02 DIAGNOSIS — L570 Actinic keratosis: Secondary | ICD-10-CM | POA: Diagnosis not present

## 2024-01-15 DIAGNOSIS — G4733 Obstructive sleep apnea (adult) (pediatric): Secondary | ICD-10-CM | POA: Diagnosis not present

## 2024-02-24 DIAGNOSIS — D485 Neoplasm of uncertain behavior of skin: Secondary | ICD-10-CM | POA: Diagnosis not present

## 2024-02-24 DIAGNOSIS — L111 Transient acantholytic dermatosis [Grover]: Secondary | ICD-10-CM | POA: Diagnosis not present

## 2024-06-16 DIAGNOSIS — G4733 Obstructive sleep apnea (adult) (pediatric): Secondary | ICD-10-CM | POA: Diagnosis not present

## 2024-09-28 DIAGNOSIS — Z23 Encounter for immunization: Secondary | ICD-10-CM | POA: Diagnosis not present

## 2024-09-28 DIAGNOSIS — Z1339 Encounter for screening examination for other mental health and behavioral disorders: Secondary | ICD-10-CM | POA: Diagnosis not present

## 2024-09-28 DIAGNOSIS — Z8673 Personal history of transient ischemic attack (TIA), and cerebral infarction without residual deficits: Secondary | ICD-10-CM | POA: Diagnosis not present

## 2024-09-28 DIAGNOSIS — Z1331 Encounter for screening for depression: Secondary | ICD-10-CM | POA: Diagnosis not present

## 2024-09-28 DIAGNOSIS — Z0000 Encounter for general adult medical examination without abnormal findings: Secondary | ICD-10-CM | POA: Diagnosis not present

## 2024-09-28 DIAGNOSIS — R82998 Other abnormal findings in urine: Secondary | ICD-10-CM | POA: Diagnosis not present

## 2024-10-01 DIAGNOSIS — S30861A Insect bite (nonvenomous) of abdominal wall, initial encounter: Secondary | ICD-10-CM | POA: Diagnosis not present

## 2024-10-01 DIAGNOSIS — L821 Other seborrheic keratosis: Secondary | ICD-10-CM | POA: Diagnosis not present

## 2024-10-01 DIAGNOSIS — Z1283 Encounter for screening for malignant neoplasm of skin: Secondary | ICD-10-CM | POA: Diagnosis not present

## 2024-10-01 DIAGNOSIS — L570 Actinic keratosis: Secondary | ICD-10-CM | POA: Diagnosis not present

## 2024-10-01 DIAGNOSIS — X32XXXD Exposure to sunlight, subsequent encounter: Secondary | ICD-10-CM | POA: Diagnosis not present

## 2024-10-01 DIAGNOSIS — D225 Melanocytic nevi of trunk: Secondary | ICD-10-CM | POA: Diagnosis not present

## 2024-10-26 DIAGNOSIS — H532 Diplopia: Secondary | ICD-10-CM | POA: Diagnosis not present

## 2024-10-26 DIAGNOSIS — H52203 Unspecified astigmatism, bilateral: Secondary | ICD-10-CM | POA: Diagnosis not present

## 2024-10-26 DIAGNOSIS — Z961 Presence of intraocular lens: Secondary | ICD-10-CM | POA: Diagnosis not present

## 2024-11-08 NOTE — Progress Notes (Unsigned)
 PATIENT: Chris [name] DOB: 1946-02-28  REASON FOR VISIT: follow up HISTORY FROM: patient PRIMARY NEUROLOGIST: Dr. Buck  No chief complaint on file.    HISTORY OF PRESENT ILLNESS: Today 11/08/24:  Chris [name] is a 78 y.o. male with a history of ***. Returns today for follow-up.      11/10/23: Chris [name] is a 78 y.o. male with a history of OSA on CPAP. Returns today for follow-up.  Reports that CPAP is working well.  States occasionally his hose will pop off but otherwise doing okay.  Download is below       11/06/22: Chris [name] is a 78 year old male with a history of obstructive sleep apnea on CPAP.  He returns today for follow-up.  His download indicates that he uses machine nightly for compliance of 100%.  He uses machine greater than 4 hours each night.  On average he uses his machine 8 hours and 42 minutes.  His residual AHI is 10.4 on 8 cm of water with EPR 2.  Reports that sometimes he feels the mask leaking but is not bothersome to him.  11/05/21: Chris [name] is a 78 year old male with a history of obstructive sleep apnea on CPAP.  He returns today for follow-up.  He reports that the CPAP continues to work fairly well for him.  He does state since he got this new machine he feels that he is getting too much air.  Also feels that at night his stomach feels up with air.  He denies any other issues.  He returns today for an evaluation.    HISTORY 10/30/20:   Chris [name] is a 78 year old male with a history of obstructive sleep apnea on CPAP.  His download is from July 15 August 13.  He uses machine nightly for compliance of 100%.  He uses machine greater than 4 hours each night.  On average he uses his machine 8 hours and 29 minutes.  His residual AHI is 4.5 on 9 cm of water.  He states that since the recall he has been back to using his old machine.  He notes that when he was using his new machine he developed a deep cough.  When he switched to his old machine the cough seemed  to improve.  He also noticed black particles floating in his new machine.  He returns today for follow-up.     REVIEW OF SYSTEMS: Out of a complete 14 system review of symptoms, the patient complains only of the following symptoms, and all other reviewed systems are negative.   ESS 8  ALLERGIES: Allergies  Allergen Reactions   Morphine And Codeine Nausea And Vomiting    HOME MEDICATIONS: Outpatient Medications Prior to Visit  Medication Sig Dispense Refill   atorvastatin  (LIPITOR ) 40 MG tablet Take 40 mg by mouth daily at 6 PM.      clopidogrel  (PLAVIX ) 75 MG tablet Take 1 tablet (75 mg total) by mouth daily. (Patient taking differently: Take 75 mg by mouth at bedtime.) 30 tablet 0   famotidine  (PEPCID ) 20 MG tablet Take 20 mg by mouth daily.     fluticasone  (FLONASE ) 50 MCG/ACT nasal spray Place 1 spray into both nostrils daily as needed for allergies.      ibuprofen (ADVIL,MOTRIN) 200 MG tablet Take 400 mg by mouth every 6 (six) hours as needed.     Probiotic Product (ALIGN PO) Take by mouth.     telmisartan (MICARDIS) 80 MG tablet Take 80 mg  by mouth at bedtime.      No facility-administered medications prior to visit.    PAST MEDICAL HISTORY: Past Medical History:  Diagnosis Date   Acute CVA (cerebrovascular accident) (HCC) 03/25/2018   double vision and balance issues (03/26/2018)   ADD (attention deficit disorder)    Arthritis    thumbs and fingers (03/26/2018)   Diverticulosis    GERD (gastroesophageal reflux disease)    Heart murmur    when I was young; not noted since age 12 (03/26/2018)   History of kidney stones    Hyperlipidemia    Hypertension    IFG (impaired fasting glucose)    Metabolic syndrome    OSA on CPAP    Pneumonia    a time or 2 (03/26/2018)   PONV (postoperative nausea and vomiting)    Shingles ~ 2002   TIA (transient ischemic attack) 2013    PAST SURGICAL HISTORY: Past Surgical History:  Procedure Laterality Date   CARPAL TUNNEL  RELEASE Right    CYSTOSCOPY W/ STONE MANIPULATION     FINGER SURGERY Left    thumb surgery; took bone out; put tendon in   TONSILLECTOMY AND ADENOIDECTOMY      FAMILY HISTORY: Family History  Problem Relation Age of Onset   Prostate cancer Father    GER disease Sister    Irritable bowel syndrome Sister    Colon cancer Neg Hx    Esophageal cancer Neg Hx    Liver cancer Neg Hx    Rectal cancer Neg Hx    Sleep apnea Neg Hx     SOCIAL HISTORY: Social History   Socioeconomic History   Marital status: Married    Spouse name: Not on file   Number of children: Not on file   Years of education: Not on file   Highest education level: Not on file  Occupational History   Not on file  Tobacco Use   Smoking status: Never   Smokeless tobacco: Never  Vaping Use   Vaping status: Never Used  Substance and Sexual Activity   Alcohol  use: No   Drug use: Never   Sexual activity: Not on file  Other Topics Concern   Not on file  Social History Narrative   Not on file   Social Drivers of Health   Financial Resource Strain: Not on file  Food Insecurity: No Food Insecurity (06/28/2023)   Hunger Vital Sign    Worried About Running Out of Food in the Last Year: Never true    Ran Out of Food in the Last Year: Never true  Transportation Needs: No Transportation Needs (06/28/2023)   PRAPARE - Administrator, Civil Service (Medical): No    Lack of Transportation (Non-Medical): No  Physical Activity: Not on file  Stress: Not on file  Social Connections: Not on file  Intimate Partner Violence: Not At Risk (06/28/2023)   Humiliation, Afraid, Rape, and Kick questionnaire    Fear of Current or Ex-Partner: No    Emotionally Abused: No    Physically Abused: No    Sexually Abused: No      PHYSICAL EXAM  There were no vitals filed for this visit.  There is no height or weight on file to calculate BMI.  Generalized: Well developed, in no acute distress  Chest: Lungs clear to  auscultation bilaterally  Neurological examination  Mentation: Alert oriented to time, place, history taking. Follows all commands speech and language fluent Cranial nerve II-XII: Extraocular movements were  full, visual field were full on confrontational test Head turning and shoulder shrug  were normal and symmetric. Gait and station: Gait is normal.    DIAGNOSTIC DATA (LABS, IMAGING, TESTING) - I reviewed patient records, labs, notes, testing and imaging myself where available.  Lab Results  Component Value Date   WBC 19.1 (H) 06/30/2023   HGB 12.0 (L) 06/30/2023   HCT 35.5 (L) 06/30/2023   MCV 89.9 06/30/2023   PLT 189 06/30/2023      Component Value Date/Time   NA 135 06/30/2023 0409   K 3.8 06/30/2023 0409   CL 103 06/30/2023 0409   CO2 23 06/30/2023 0409   GLUCOSE 120 (H) 06/30/2023 0409   BUN 11 06/30/2023 0409   CREATININE 1.01 06/30/2023 0409   CALCIUM  8.1 (L) 06/30/2023 0409   PROT 6.1 (L) 06/30/2023 0409   ALBUMIN 3.3 (L) 06/30/2023 0409   AST 15 06/30/2023 0409   ALT 16 06/30/2023 0409   ALKPHOS 66 06/30/2023 0409   BILITOT 1.2 06/30/2023 0409   GFRNONAA >60 06/30/2023 0409   GFRAA >60 10/20/2018 1406   Lab Results  Component Value Date   CHOL 96 03/27/2018   HDL 33 (L) 03/27/2018   LDLCALC 45 03/27/2018   TRIG 92 03/27/2018   CHOLHDL 2.9 03/27/2018   Lab Results  Component Value Date   HGBA1C 6.1 (H) 03/27/2018     ASSESSMENT AND PLAN 78 y.o. year old male  has a past medical history of Acute CVA (cerebrovascular accident) (HCC) (03/25/2018), ADD (attention deficit disorder), Arthritis, Diverticulosis, GERD (gastroesophageal reflux disease), Heart murmur, History of kidney stones, Hyperlipidemia, Hypertension, IFG (impaired fasting glucose), Metabolic syndrome, OSA on CPAP, Pneumonia, PONV (postoperative nausea and vomiting), Shingles (~ 2002), and TIA (transient ischemic attack) (2013). here with:  OSA on CPAP  - CPAP compliance  excellent -Residual AHI in normal range - Encourage patient to use CPAP nightly and > 4 hours each night - F/U in 1 year or sooner if needed    Duwaine Russell, MSN, NP-C 11/08/2024, 3:57 PM [facility] [address], [address] [place], [place] [address] [phone]

## 2024-11-08 NOTE — Progress Notes (Unsigned)
 Chris [name]

## 2024-11-09 ENCOUNTER — Ambulatory Visit: Payer: Medicare HMO | Admitting: Adult Health

## 2024-11-09 ENCOUNTER — Encounter: Payer: Self-pay | Admitting: Adult Health

## 2024-11-09 VITALS — BP 145/68 | HR 70 | Ht 71.0 in | Wt 200.0 lb

## 2024-11-09 DIAGNOSIS — G4733 Obstructive sleep apnea (adult) (pediatric): Secondary | ICD-10-CM | POA: Diagnosis not present

## 2024-11-09 NOTE — Patient Instructions (Signed)
 Continue using CPAP nightly and greater than 4 hours each night If your symptoms worsen or you develop new symptoms please let us  know.

## 2025-11-09 ENCOUNTER — Ambulatory Visit: Admitting: Adult Health
